# Patient Record
Sex: Male | Born: 1980 | Race: Black or African American | Hispanic: No | Marital: Single | Smoking: Former smoker
Health system: Southern US, Community
[De-identification: ages and names within clinical notes are randomized; demographics above are authoritative.]

## PROBLEM LIST (undated history)

## (undated) DIAGNOSIS — F329 Major depressive disorder, single episode, unspecified: Secondary | ICD-10-CM

## (undated) DIAGNOSIS — G4730 Sleep apnea, unspecified: Secondary | ICD-10-CM

## (undated) DIAGNOSIS — S71132A Puncture wound without foreign body, left thigh, initial encounter: Secondary | ICD-10-CM

## (undated) DIAGNOSIS — R569 Unspecified convulsions: Secondary | ICD-10-CM

## (undated) DIAGNOSIS — K219 Gastro-esophageal reflux disease without esophagitis: Secondary | ICD-10-CM

## (undated) DIAGNOSIS — D869 Sarcoidosis, unspecified: Secondary | ICD-10-CM

## (undated) DIAGNOSIS — F32A Depression, unspecified: Secondary | ICD-10-CM

## (undated) DIAGNOSIS — I739 Peripheral vascular disease, unspecified: Secondary | ICD-10-CM

## (undated) DIAGNOSIS — J189 Pneumonia, unspecified organism: Secondary | ICD-10-CM

## (undated) DIAGNOSIS — J45909 Unspecified asthma, uncomplicated: Secondary | ICD-10-CM

## (undated) DIAGNOSIS — I10 Essential (primary) hypertension: Secondary | ICD-10-CM

## (undated) DIAGNOSIS — Z9289 Personal history of other medical treatment: Secondary | ICD-10-CM

## (undated) HISTORY — PX: CATARACT EXTRACTION W/ INTRAOCULAR LENS  IMPLANT, BILATERAL: SHX1307

## (undated) HISTORY — PX: EYE SURGERY: SHX253

## (undated) HISTORY — PX: TONSILLECTOMY: SUR1361
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
## 2002-04-16 DIAGNOSIS — S71132A Puncture wound without foreign body, left thigh, initial encounter: Secondary | ICD-10-CM

## 2002-04-16 HISTORY — DX: Puncture wound without foreign body, left thigh, initial encounter: S71.132A

## 2008-09-14 ENCOUNTER — Emergency Department (HOSPITAL_BASED_OUTPATIENT_CLINIC_OR_DEPARTMENT_OTHER): Admission: EM | Admit: 2008-09-14 | Discharge: 2008-09-14 | Payer: Self-pay | Admitting: Emergency Medicine

## 2009-01-31 ENCOUNTER — Ambulatory Visit: Payer: Self-pay | Admitting: Thoracic Surgery (Cardiothoracic Vascular Surgery)

## 2010-07-24 LAB — GLUCOSE, CAPILLARY: Glucose-Capillary: 169 mg/dL — ABNORMAL HIGH (ref 70–99)

## 2010-09-07 ENCOUNTER — Emergency Department (HOSPITAL_BASED_OUTPATIENT_CLINIC_OR_DEPARTMENT_OTHER)
Admission: EM | Admit: 2010-09-07 | Discharge: 2010-09-07 | Disposition: A | Discharge status: Home / Self Care | Payer: Medicaid Other | Attending: Emergency Medicine | Admitting: Emergency Medicine

## 2010-09-07 DIAGNOSIS — F17200 Nicotine dependence, unspecified, uncomplicated: Secondary | ICD-10-CM | POA: Insufficient documentation

## 2010-09-07 DIAGNOSIS — E7800 Pure hypercholesterolemia, unspecified: Secondary | ICD-10-CM | POA: Insufficient documentation

## 2010-09-07 DIAGNOSIS — K219 Gastro-esophageal reflux disease without esophagitis: Secondary | ICD-10-CM | POA: Insufficient documentation

## 2010-09-07 DIAGNOSIS — Z79899 Other long term (current) drug therapy: Secondary | ICD-10-CM | POA: Insufficient documentation

## 2010-09-07 DIAGNOSIS — E1169 Type 2 diabetes mellitus with other specified complication: Secondary | ICD-10-CM | POA: Insufficient documentation

## 2010-09-07 LAB — GLUCOSE, CAPILLARY
Glucose-Capillary: >600 mg/dL (ref 70–99)
Glucose-Capillary: 453 mg/dL — ABNORMAL HIGH (ref 70–99)
Glucose-Capillary: 433 mg/dL — ABNORMAL HIGH (ref 70–99)

## 2010-09-07 LAB — URINALYSIS, ROUTINE W REFLEX MICROSCOPIC
Bilirubin Urine: NEGATIVE
Leukocytes, UA: NEGATIVE
Nitrite: NEGATIVE
Specific Gravity, Urine: 1.04 — ABNORMAL HIGH (ref 1.005–1.030)
Urobilinogen, UA: 0.2 mg/dL (ref 0.0–1.0)

## 2010-09-07 LAB — COMPREHENSIVE METABOLIC PANEL
Alkaline Phosphatase: 182 U/L — ABNORMAL HIGH (ref 39–117)
BUN: 15 mg/dL (ref 6–23)
Glucose, Bld: 602 mg/dL (ref 70–99)
Potassium: 4.1 mEq/L (ref 3.5–5.1)
Total Bilirubin: 0.3 mg/dL (ref 0.3–1.2)
Total Protein: 7.4 g/dL (ref 6.0–8.3)

## 2010-09-07 LAB — CBC
MCV: 83.8 fL (ref 78.0–100.0)
Platelets: 230 10*3/uL (ref 150–400)
RDW: 12.7 % (ref 11.5–15.5)
WBC: 5.1 10*3/uL (ref 4.0–10.5)

## 2010-09-07 LAB — DIFFERENTIAL
Basophils Absolute: 0 10*3/uL (ref 0.0–0.1)
Basophils Relative: 0 % (ref 0–1)
Eosinophils Absolute: 0 10*3/uL (ref 0.0–0.7)
Eosinophils Relative: 1 % (ref 0–5)
Neutrophils Relative %: 52 % (ref 43–77)

## 2010-09-07 LAB — URINE MICROSCOPIC-ADD ON

## 2011-04-03 ENCOUNTER — Emergency Department (HOSPITAL_BASED_OUTPATIENT_CLINIC_OR_DEPARTMENT_OTHER)
Admission: EM | Admit: 2011-04-03 | Discharge: 2011-04-03 | Disposition: A | Discharge status: Home / Self Care | Payer: Medicaid Other | Attending: Emergency Medicine | Admitting: Emergency Medicine

## 2011-04-03 ENCOUNTER — Encounter: Payer: Self-pay | Admitting: Emergency Medicine

## 2011-04-03 DIAGNOSIS — H547 Unspecified visual loss: Secondary | ICD-10-CM

## 2011-04-03 DIAGNOSIS — F17200 Nicotine dependence, unspecified, uncomplicated: Secondary | ICD-10-CM | POA: Insufficient documentation

## 2011-04-03 DIAGNOSIS — E119 Type 2 diabetes mellitus without complications: Secondary | ICD-10-CM | POA: Insufficient documentation

## 2011-04-03 DIAGNOSIS — H269 Unspecified cataract: Secondary | ICD-10-CM

## 2011-04-03 NOTE — ED Notes (Signed)
Pt unable to see visual acuity chart.

## 2011-04-03 NOTE — ED Provider Notes (Signed)
History     CSN: [id number] Arrival date & time: 04/03/2011  5:59 PM   First MD Initiated Contact with Patient 04/03/11 1830      Chief Complaint  Patient presents with  . Visual Field Change    (Consider location/radiation/quality/duration/timing/severity/associated sxs/prior treatment) HPI Comments: Patient with history of cataract in left eye.  Was seen by eye doctor one month ago and diagnosed with this.  Is awaiting medicaid approval to have this done.  Now presents with difficulty seeing out of the right eye.  No pain or injury.  Was told there was a cataract in this eye as well and that both would have to be fixed.  The history is provided by the patient.    Past Medical History  Diagnosis Date  . Diabetes mellitus   . Cataract     History reviewed. No pertinent past surgical history.  History reviewed. No pertinent family history.  History  Substance Use Topics  . Smoking status: Current Everyday Smoker -- 0.5 packs/day    Types: Cigarettes  . Smokeless tobacco: Not on file  . Alcohol Use: Yes     occasional      Review of Systems  All other systems reviewed and are negative.    Allergies  Review of patient's allergies indicates no known allergies.  Home Medications   Current Outpatient Rx  Name Route Sig Dispense Refill  . INSULIN ASPART PROT & ASPART (70-30) 100 UNIT/ML [place] SUSP Subcutaneous Inject 40 Units into the skin 2 (two) times daily with a meal.        BP 139/80  Pulse 95  Temp(Src) 98.3 F (36.8 C) (Oral)  Resp 16  SpO2 99%  Physical Exam  Nursing note and vitals reviewed. Constitutional: He is oriented to person, place, and time. He appears well-developed and well-nourished. No distress.  HENT:  Head: Normocephalic and atraumatic.  Eyes: Conjunctivae and EOM are normal. Pupils are equal, round, and reactive to light.       Eye exam reveals bilateral opacities consistent with cataracts.  The fundus is unable to be visualized.   PERRL.  Neck: Normal range of motion. Neck supple.  Neurological: He is alert and oriented to person, place, and time. No cranial nerve deficit. Coordination normal.  Skin: Skin is warm and dry. He is not diaphoretic.    ED Course  Procedures (including critical care time)  Labs Reviewed - No data to display No results found.   No diagnosis found.    MDM  Patient has established care with an opthalmologist.  I recommended that they call tomorrow morning to arrange an appointment with them as soon as possible.          Geoffery Lyons, MD 04/03/11 [phone]

## 2011-04-03 NOTE — ED Notes (Signed)
Pt states he started having blurry vision one month ago to left eye.  Went to eye doctor and was told he had cataract.  Yesterday, the pt states the right eye started with blurry vision.  No head injury.  Blood sugars have been approx 120-130 in am.  No other symptoms.

## 2014-12-05 ENCOUNTER — Emergency Department (HOSPITAL_BASED_OUTPATIENT_CLINIC_OR_DEPARTMENT_OTHER)
Admission: EM | Admit: 2014-12-05 | Discharge: 2014-12-05 | Disposition: A | Discharge status: Home / Self Care | Payer: Medicaid Other | Attending: Emergency Medicine | Admitting: Emergency Medicine

## 2014-12-05 ENCOUNTER — Encounter (HOSPITAL_BASED_OUTPATIENT_CLINIC_OR_DEPARTMENT_OTHER): Payer: Self-pay | Admitting: Emergency Medicine

## 2014-12-05 DIAGNOSIS — E119 Type 2 diabetes mellitus without complications: Secondary | ICD-10-CM | POA: Insufficient documentation

## 2014-12-05 DIAGNOSIS — M546 Pain in thoracic spine: Secondary | ICD-10-CM | POA: Insufficient documentation

## 2014-12-05 DIAGNOSIS — H269 Unspecified cataract: Secondary | ICD-10-CM | POA: Insufficient documentation

## 2014-12-05 DIAGNOSIS — Z794 Long term (current) use of insulin: Secondary | ICD-10-CM | POA: Insufficient documentation

## 2014-12-05 DIAGNOSIS — Z720 Tobacco use: Secondary | ICD-10-CM | POA: Insufficient documentation

## 2014-12-05 MED ORDER — HYDROCODONE-ACETAMINOPHEN 5-325 MG PO TABS: ORAL_TABLET | ORAL | Status: DC

## 2014-12-05 MED ORDER — METHOCARBAMOL 500 MG PO TABS
500.0000 mg | ORAL_TABLET | Freq: Once | ORAL | Status: AC
  Administered 2014-12-05: 500 mg via ORAL
  Filled 2014-12-05: qty 1

## 2014-12-05 MED ORDER — MORPHINE SULFATE (PF) 4 MG/ML IV SOLN
6.0000 mg | Freq: Once | INTRAVENOUS | Status: AC
  Administered 2014-12-05: 6 mg via INTRAMUSCULAR
  Filled 2014-12-05: qty 2

## 2014-12-05 MED ORDER — METHOCARBAMOL 500 MG PO TABS: 1000.0000 mg | ORAL_TABLET | Freq: Four times a day (QID) | ORAL | Status: DC | PRN

## 2014-12-05 NOTE — Discharge Instructions (Signed)
Please take ibuprofen 400mg (this is normally 2 over the counter pills) every 6 hours (take with food to minimze stomach irritation).  ° °Take robaxin and/or Vicodin for breakthrough pain, do not drink alcohol, drive, care for children or perfom other critical tasks while taking robaxin and/or Vicodin . ° °Please follow with your primary care doctor in the next 2 days for a check-up. They must obtain records for further management.  ° °Do not hesitate to return to the Emergency Department for any new, worsening or concerning symptoms.  ° °

## 2014-12-05 NOTE — ED Notes (Signed)
Patient reports back pain which began last night.  Denies injury.  Reports pain to upper and lower right side of back.

## 2014-12-05 NOTE — ED Provider Notes (Signed)
CSN: [id number]     Arrival date & time 12/05/14  1727 History   First MD Initiated Contact with Patient 12/05/14 1805     Chief Complaint  Patient presents with  . Back Pain     (Consider location/radiation/quality/duration/timing/severity/associated sxs/prior Treatment) HPI   Blood pressure 151/107, pulse 98, temperature 99.2 F (37.3 C), temperature source Oral, resp. rate 18, height 6\' 1"  (1.854 m), weight 310 lb (140.615 kg), SpO2 98 %.  Ronnie Shaw is a 34 y.o. male complaining of severe right low thoracic and upper thoracic back pain onset last night. Patient denies any trauma, cough, shortness of breath, fever, chills, history of DVT or PE, calf pain or leg swelling. States pain is exacerbated by movement and palpation, and deep breaths. no pain medication taken prior to arrival.   Past Medical History  Diagnosis Date  . Diabetes mellitus   . Cataract    History reviewed. No pertinent past surgical history. History reviewed. No pertinent family history. Social History  Substance Use Topics  . Smoking status: Current Every Day Smoker -- 0.00 packs/day  . Smokeless tobacco: None  . Alcohol Use: Yes     Comment: occasional    Review of Systems  10 systems reviewed and found to be negative, except as noted in the HPI.   Allergies  Prednisone  Home Medications   Prior to Admission medications   Medication Sig Start Date End Date Taking? Authorizing Provider  HYDROcodone-acetaminophen (NORCO/VICODIN) 5-325 MG per tablet Take 1-2 tablets by mouth every 6 hours as needed for pain and/or cough. 12/05/14   Kenyette Gundy, PA-C  insulin aspart protamine-insulin aspart (NOVOLOG 70/30) (70-30) 100 UNIT/ML injection Inject 40 Units into the skin 2 (two) times daily with a meal.      Historical Provider, MD  methocarbamol (ROBAXIN) 500 MG tablet Take 2 tablets (1,000 mg total) by mouth 4 (four) times daily as needed (Pain). 12/05/14   Kenzi Bardwell, PA-C   BP 151/107  mmHg  Pulse 98  Temp(Src) 99.2 F (37.3 C) (Oral)  Resp 18  Ht 6\' 1"  (1.854 m)  Wt 310 lb (140.615 kg)  BMI 40.91 kg/m2  SpO2 98% Physical Exam  Constitutional: He is oriented to person, place, and time. He appears well-developed and well-nourished. No distress.  HENT:  Head: Normocephalic.  Mouth/Throat: Oropharynx is clear and moist.  Eyes: Conjunctivae and EOM are normal. Pupils are equal, round, and reactive to light.  Cardiovascular: Normal rate, regular rhythm and intact distal pulses.   Pulmonary/Chest: Effort normal and breath sounds normal. No stridor. No respiratory distress. He has no wheezes. He has no rales. He exhibits tenderness.  Abdominal: Soft.  Musculoskeletal: Normal range of motion.       Arms: Neurological: He is alert and oriented to person, place, and time.  Psychiatric: He has a normal mood and affect.  Nursing note and vitals reviewed.   ED Course  Procedures (including critical care time) Labs Review Labs Reviewed - No data to display  Imaging Review No results found. I have personally reviewed and evaluated these images and lab results as part of my medical decision-making.   EKG Interpretation None      MDM   Final diagnoses:  Acute thoracic back pain    Filed Vitals:   12/05/14 1733  BP: 151/107  Pulse: 98  Temp: 99.2 F (37.3 C)  TempSrc: Oral  Resp: 18  Height: 6\' 1"  (1.854 m)  Weight: 310 lb (140.615 kg)  SpO2: 98%  Medications  morphine 4 MG/ML injection 6 mg (6 mg Intramuscular Given 12/05/14 1835)  methocarbamol (ROBAXIN) tablet 500 mg (500 mg Oral Given 12/05/14 1835)    Ronnie Shaw is a pleasant 34 y.o. male presenting with right thoracic upper and lower back pain. These do not communicate he is well able to localize the area of his discomfort and they are exquisitely tender to palpation. There was no trauma however, I think this is musculoskeletal in nature, he is very tender to light palpation and movement of  the right arm also exacerbates the pain. Patient is not tachypnea or tachycardic. He saturating well on room air and his lung sounds are clear to auscultation bilaterally.   Evaluation does not show pathology that would require ongoing emergent intervention or inpatient treatment. Pt is hemodynamically stable and mentating appropriately. Discussed findings and plan with patient/guardian, who agrees with care plan. All questions answered. Return precautions discussed and outpatient follow up given.   New Prescriptions   HYDROCODONE-ACETAMINOPHEN (NORCO/VICODIN) 5-325 MG PER TABLET    Take 1-2 tablets by mouth every 6 hours as needed for pain and/or cough.   METHOCARBAMOL (ROBAXIN) 500 MG TABLET    Take 2 tablets (1,000 mg total) by mouth 4 (four) times daily as needed (Pain).         Wynetta Emery, PA-C 12/05/14 1914  Geoffery Lyons, MD 12/06/14 2259

## 2014-12-18 ENCOUNTER — Emergency Department (HOSPITAL_COMMUNITY): Payer: Self-pay

## 2014-12-18 ENCOUNTER — Encounter (HOSPITAL_COMMUNITY): Payer: Self-pay | Admitting: Emergency Medicine

## 2014-12-18 ENCOUNTER — Emergency Department (HOSPITAL_COMMUNITY): Payer: Medicaid Other

## 2014-12-18 ENCOUNTER — Emergency Department (HOSPITAL_COMMUNITY)
Admission: EM | Admit: 2014-12-18 | Discharge: 2014-12-18 | Disposition: A | Discharge status: Home / Self Care | Payer: Self-pay | Attending: Emergency Medicine | Admitting: Emergency Medicine

## 2014-12-18 DIAGNOSIS — R61 Generalized hyperhidrosis: Secondary | ICD-10-CM | POA: Insufficient documentation

## 2014-12-18 DIAGNOSIS — E119 Type 2 diabetes mellitus without complications: Secondary | ICD-10-CM | POA: Insufficient documentation

## 2014-12-18 DIAGNOSIS — J189 Pneumonia, unspecified organism: Secondary | ICD-10-CM

## 2014-12-18 DIAGNOSIS — Z720 Tobacco use: Secondary | ICD-10-CM | POA: Insufficient documentation

## 2014-12-18 DIAGNOSIS — H269 Unspecified cataract: Secondary | ICD-10-CM | POA: Insufficient documentation

## 2014-12-18 DIAGNOSIS — Z794 Long term (current) use of insulin: Secondary | ICD-10-CM | POA: Insufficient documentation

## 2014-12-18 DIAGNOSIS — R109 Unspecified abdominal pain: Secondary | ICD-10-CM | POA: Insufficient documentation

## 2014-12-18 DIAGNOSIS — E663 Overweight: Secondary | ICD-10-CM | POA: Insufficient documentation

## 2014-12-18 DIAGNOSIS — J159 Unspecified bacterial pneumonia: Secondary | ICD-10-CM | POA: Insufficient documentation

## 2014-12-18 LAB — CBC WITH DIFFERENTIAL/PLATELET
BASOS PCT: 0 % (ref 0–1)
Basophils Absolute: 0 10*3/uL (ref 0.0–0.1)
Eosinophils Absolute: 0.1 10*3/uL (ref 0.0–0.7)
Eosinophils Relative: 1 % (ref 0–5)
HEMATOCRIT: 38.6 % — AB (ref 39.0–52.0)
HEMOGLOBIN: 12.7 g/dL — AB (ref 13.0–17.0)
LYMPHS ABS: 2.3 10*3/uL (ref 0.7–4.0)
LYMPHS PCT: 25 % (ref 12–46)
MCH: 28.4 pg (ref 26.0–34.0)
MCHC: 32.9 g/dL (ref 30.0–36.0)
MCV: 86.4 fL (ref 78.0–100.0)
MONO ABS: 0.7 10*3/uL (ref 0.1–1.0)
MONOS PCT: 8 % (ref 3–12)
NEUTROS ABS: 6.2 10*3/uL (ref 1.7–7.7)
NEUTROS PCT: 66 % (ref 43–77)
Platelets: 367 10*3/uL (ref 150–400)
RBC: 4.47 MIL/uL (ref 4.22–5.81)
RDW: 12.9 % (ref 11.5–15.5)
WBC: 9.3 10*3/uL (ref 4.0–10.5)

## 2014-12-18 LAB — COMPREHENSIVE METABOLIC PANEL
ALBUMIN: 3.8 g/dL (ref 3.5–5.0)
ALT: 22 U/L (ref 17–63)
ANION GAP: 9 (ref 5–15)
AST: 16 U/L (ref 15–41)
Alkaline Phosphatase: 103 U/L (ref 38–126)
BUN: 11 mg/dL (ref 6–20)
CHLORIDE: 93 mmol/L — AB (ref 101–111)
CO2: 25 mmol/L (ref 22–32)
Calcium: 9.2 mg/dL (ref 8.9–10.3)
Creatinine, Ser: 0.7 mg/dL (ref 0.61–1.24)
GFR calc Af Amer: >60 mL/min (ref >60)
Glucose, Bld: 249 mg/dL — ABNORMAL HIGH (ref 65–99)
POTASSIUM: 4.2 mmol/L (ref 3.5–5.1)
Sodium: 127 mmol/L — ABNORMAL LOW (ref 135–145)
Total Bilirubin: 0.4 mg/dL (ref 0.3–1.2)
Total Protein: 8.2 g/dL — ABNORMAL HIGH (ref 6.5–8.1)

## 2014-12-18 LAB — URINALYSIS, ROUTINE W REFLEX MICROSCOPIC
Bilirubin Urine: NEGATIVE
GLUCOSE, UA: 500 mg/dL — AB
Hgb urine dipstick: NEGATIVE
KETONES UR: NEGATIVE mg/dL
NITRITE: NEGATIVE
PROTEIN: NEGATIVE mg/dL
Specific Gravity, Urine: 1.031 — ABNORMAL HIGH (ref 1.005–1.030)
Urobilinogen, UA: 1 mg/dL (ref 0.0–1.0)
pH: 6.5 (ref 5.0–8.0)

## 2014-12-18 LAB — URINE MICROSCOPIC-ADD ON

## 2014-12-18 LAB — I-STAT TROPONIN, ED: TROPONIN I, POC: 0 ng/mL (ref 0.00–0.08)

## 2014-12-18 LAB — LIPASE, BLOOD: Lipase: <10 U/L — ABNORMAL LOW (ref 22–51)

## 2014-12-18 LAB — CBG MONITORING, ED: Glucose-Capillary: 217 mg/dL — ABNORMAL HIGH (ref 65–99)

## 2014-12-18 MED ORDER — KETOROLAC TROMETHAMINE 30 MG/ML IJ SOLN
30.0000 mg | Freq: Once | INTRAMUSCULAR | Status: AC
  Administered 2014-12-18: 30 mg via INTRAVENOUS
  Filled 2014-12-18: qty 1

## 2014-12-18 MED ORDER — AZITHROMYCIN 250 MG PO TABS: 250.0000 mg | ORAL_TABLET | Freq: Every day | ORAL | Status: AC

## 2014-12-18 MED ORDER — SODIUM CHLORIDE 0.9 % IV SOLN
1000.0000 mL | INTRAVENOUS | Status: DC
  Administered 2014-12-18: 1000 mL via INTRAVENOUS

## 2014-12-18 MED ORDER — AMOXICILLIN 500 MG PO CAPS: 500.0000 mg | ORAL_CAPSULE | Freq: Three times a day (TID) | ORAL | Status: DC

## 2014-12-18 MED ORDER — NAPROXEN 500 MG PO TABS: 500.0000 mg | ORAL_TABLET | Freq: Two times a day (BID) | ORAL | Status: DC

## 2014-12-18 MED ORDER — HYDROMORPHONE HCL 1 MG/ML IJ SOLN
1.0000 mg | INTRAMUSCULAR | Status: DC | PRN
  Administered 2014-12-18 (×2): 1 mg via INTRAVENOUS
  Filled 2014-12-18 (×2): qty 1

## 2014-12-18 MED ORDER — DEXTROSE 5 % IV SOLN
1.0000 g | Freq: Once | INTRAVENOUS | Status: AC
  Administered 2014-12-18: 1 g via INTRAVENOUS
  Filled 2014-12-18: qty 10

## 2014-12-18 MED ORDER — AZITHROMYCIN 250 MG PO TABS
500.0000 mg | ORAL_TABLET | Freq: Once | ORAL | Status: AC
  Administered 2014-12-18: 500 mg via ORAL
  Filled 2014-12-18: qty 2

## 2014-12-18 MED ORDER — OXYCODONE-ACETAMINOPHEN 5-325 MG PO TABS: 1.0000 | ORAL_TABLET | Freq: Four times a day (QID) | ORAL | Status: DC | PRN

## 2014-12-18 MED ORDER — IOHEXOL 350 MG/ML SOLN
100.0000 mL | Freq: Once | INTRAVENOUS | Status: AC | PRN
  Administered 2014-12-18: 100 mL via INTRAVENOUS

## 2014-12-18 NOTE — ED Notes (Signed)
Pt arrived to the ED with a complaint of right sided flank pain.  Pt states pain has been present for more than a week.  Pt seen previously for it but released.  Pt has unproductive cough for three days.

## 2014-12-18 NOTE — ED Notes (Signed)
Ambulated without difficulty.

## 2014-12-18 NOTE — Discharge Instructions (Signed)

## 2014-12-18 NOTE — ED Notes (Signed)
MD Knapp at bedside 

## 2014-12-18 NOTE — ED Provider Notes (Signed)
CSN: [id number]   Arrival date & time 12/18/14 0327  History  This chart was scribed for Linwood Dibbles, MD by Bethel Born, ED Scribe. This patient was seen in room WA15/WA15 and the patient's care was started at 3:44 AM.  Chief Complaint  Patient presents with  . Flank Pain    HPI The history is provided by the patient. No language interpreter was used.   Ronnie Shaw is a 34 y.o. male who presents to the Emergency Department complaining of constant right flank pain with gradual onset 2 weeks ago. Pt describes the pain as sharp and rates it 10/10 in severity. The pain is worse with deep breathing and laying flat.  Also complains of sweating and worsening cough. Pt denies fever, nausea, vomiting, LE swelling, dysuria, and hematuria.   Past Medical History  Diagnosis Date  . Diabetes mellitus   . Cataract     History reviewed. No pertinent past surgical history.  History reviewed. No pertinent family history.  Social History  Substance Use Topics  . Smoking status: Current Every Day Smoker -- 0.00 packs/day  . Smokeless tobacco: None  . Alcohol Use: Yes     Comment: occasional     Review of Systems  Constitutional: Positive for diaphoresis.  Respiratory: Positive for cough.   Genitourinary: Positive for flank pain. Negative for dysuria and hematuria.  10 Systems reviewed and all are negative for acute change except as noted in the HPI.    Home Medications   Prior to Admission medications   Medication Sig Start Date End Date Taking? Authorizing Provider  insulin aspart protamine-insulin aspart (NOVOLOG 70/30) (70-30) 100 UNIT/ML injection Inject 40 Units into the skin 2 (two) times daily with a meal.     Yes Historical Provider, MD  methocarbamol (ROBAXIN) 500 MG tablet Take 2 tablets (1,000 mg total) by mouth 4 (four) times daily as needed (Pain). 12/05/14  Yes Nicole Pisciotta, PA-C  naproxen sodium (ANAPROX) 220 MG tablet Take 440 mg by mouth 2 (two) times daily as needed  (pain).   Yes Historical Provider, MD  amoxicillin (AMOXIL) 500 MG capsule Take 1 capsule (500 mg total) by mouth 3 (three) times daily. 12/18/14   Linwood Dibbles, MD  azithromycin (ZITHROMAX) 250 MG tablet Take 1 tablet (250 mg total) by mouth daily. Take 1 tab po qd starting 12/19/14 12/19/14 12/22/14  Linwood Dibbles, MD  naproxen (NAPROSYN) 500 MG tablet Take 1 tablet (500 mg total) by mouth 2 (two) times daily. 12/18/14   Linwood Dibbles, MD  oxyCODONE-acetaminophen (PERCOCET/ROXICET) 5-325 MG per tablet Take 1-2 tablets by mouth every 6 (six) hours as needed. 12/18/14   Linwood Dibbles, MD    Allergies  Prednisone  Triage Vitals: BP 120/67 mmHg  Pulse 97  Temp(Src) 98.1 F (36.7 C) (Oral)  Resp 18  Ht  (1.854 m)  Wt 310 lb (140.615 kg)  BMI 40.91 kg/m2  SpO2 97%  Physical Exam  Constitutional: He appears well-developed and well-nourished. No distress.  Overweight  HENT:  Head: Normocephalic and atraumatic.  Right Ear: External ear normal.  Left Ear: External ear normal.  Eyes: Conjunctivae are normal. Right eye exhibits no discharge. Left eye exhibits no discharge. No scleral icterus.  Neck: Neck supple. No tracheal deviation present.  Cardiovascular: Normal rate, regular rhythm and intact distal pulses.   Pulmonary/Chest: Effort normal and breath sounds normal. No stridor. No respiratory distress. He has no wheezes. He has no rales.  Abdominal: Soft. Bowel sounds are normal. He  exhibits no distension. There is tenderness. There is CVA tenderness (right). There is no rebound and no guarding.  Musculoskeletal: He exhibits no edema or tenderness.  Neurological: He is alert. He has normal strength. No cranial nerve deficit (no facial droop, extraocular movements intact, no slurred speech) or sensory deficit. He exhibits normal muscle tone. He displays no seizure activity. Coordination normal.  Skin: Skin is warm and dry. No rash noted.  Psychiatric: He has a normal mood and affect.  Nursing note and vitals  reviewed.   ED Course  Procedures   DIAGNOSTIC STUDIES: Oxygen Saturation is 97% on RA, normal by my interpretation.    COORDINATION OF CARE: 3:47 AM Discussed treatment plan which includes lab work, CXR, Dilaudid, and IVF with pt at bedside and pt agreed to plan.  Labs Reviewed  CBC WITH DIFFERENTIAL/PLATELET - Abnormal; Notable for the following:    Hemoglobin 12.7 (*)    HCT 38.6 (*)    All other components within normal limits  LIPASE, BLOOD - Abnormal; Notable for the following:    Lipase <10 (*)    All other components within normal limits  COMPREHENSIVE METABOLIC PANEL - Abnormal; Notable for the following:    Sodium 127 (*)    Chloride 93 (*)    Glucose, Bld 249 (*)    Total Protein 8.2 (*)    All other components within normal limits  URINALYSIS, ROUTINE W REFLEX MICROSCOPIC (NOT AT [facility]) - Abnormal; Notable for the following:    Specific Gravity, Urine 1.031 (*)    Glucose, UA 500 (*)    Leukocytes, UA TRACE (*)    All other components within normal limits  URINE MICROSCOPIC-ADD ON - Abnormal; Notable for the following:    Squamous Epithelial / LPF FEW (*)    All other components within normal limits    I, Linwood Dibbles, MD, personally reviewed and evaluated these images and lab results as part of my medical decision-making.  Imaging Review Dg Chest 2 View  12/18/2014   CLINICAL DATA:  Right chest and flank pain for 2 weeks. Nonproductive cough. Dyspnea for 3 days.  EXAM: CHEST  2 VIEW  COMPARISON:  None.  FINDINGS: Possible patchy alveolar opacity in the posterior right base. The left lung is clear. No significant effusion. Pulmonary vasculature is normal. Heart size is normal.  IMPRESSION: Suspicious for a small or early posterior right lower lobe infiltrate.   Electronically Signed   By: Ellery Plunk M.D.   On: 12/18/2014 04:07   Ct Angio Chest Pe W/cm &/or Wo Cm  12/18/2014   CLINICAL DATA:  Right flank pain, onset 2 weeks ago. The pain is pleuritic and  positional. Diaphoresis. Worsening cough.  EXAM: CT ANGIOGRAPHY CHEST WITH CONTRAST  TECHNIQUE: Multidetector CT imaging of the chest was performed using the standard protocol during bolus administration of intravenous contrast. Multiplanar CT image reconstructions and MIPs were obtained to evaluate the vascular anatomy.  CONTRAST:  OMNIPAQUE IOHEXOL 350 MG/ML SOLN  COMPARISON:  12/18/2014 radiographs  FINDINGS: Cardiovascular: There is adequate opacification of the pulmonary arteries to exclude large or central pulmonary emboli. A small subsegmental embolus may be less detectable due to study limitations related to body habitus, but overall the quality of the pulmonary arterial opacification is good. There is no evidence of pulmonary embolism. The thoracic aorta is normal in caliber and intact.  Lungs: There is confluent consolidation in the right lower lobe posterior base and this may represent infectious infiltrate. This corresponds  to the abnormality observed on radiography.  Central airways: Patent  Effusions: There is a small right pleural effusion. The left lung is clear. There is no left effusion. There is no pericardial effusion.  Lymphadenopathy: There is mildly prominent adenopathy in the hilar regions, right greater than left. This may be reactive. A few nonspecific mediastinal nodes are also present, right greater than left.  Esophagus: Unremarkable  Upper abdomen: Unremarkable  Musculoskeletal: No significant abnormality  Review of the MIP images confirms the above findings.  IMPRESSION: 1. Negative for pulmonary embolism 2. Consolidation in the posterior right lower lobe, corresponding to the radiographic abnormality. This is suspicious for infectious infiltrate. There also is a small right pleural effusion. 3. Mildly prominent right hilar adenopathy, nonspecific but likely reactive.   Electronically Signed   By: Ellery Plunk M.D.   On: 12/18/2014 06:47      EKG  Rate: 95  Rhythm:  normal sinus rhythm  QRS Axis: normal  Intervals: normal  ST/T Wave abnormalities: normal  Conduction Disutrbances:none  Narrative Interpretation: early repol changes  Old EKG Reviewed: none available   Medications  HYDROmorphone (DILAUDID) injection 1 mg (1 mg Intravenous Given 12/18/14 0648)  0.9 %  sodium chloride infusion (0 mLs Intravenous Stopped 12/18/14 0446)  ketorolac (TORADOL) 30 MG/ML injection 30 mg (not administered)  cefTRIAXone (ROCEPHIN) 1 g in dextrose 5 % 50 mL IVPB (not administered)  azithromycin (ZITHROMAX) tablet 500 mg (not administered)  iohexol (OMNIPAQUE) 350 MG/ML injection 100 mL (100 mLs Intravenous Contrast Given 12/18/14 0603)      MDM   Final diagnoses:  CAP (community acquired pneumonia)   Patient CT scan shows a probable pneumonia. No evidence of pulmonary embolism.  Laboratory tests shows a chronic hyponatremia and elevation and his blood sugar.  Improved from previous labs. Will dc home with abx to cover CAP, add on amoxicillin for better s pneumo coverage.  Patient improved with treatment in the emergency department. He is comfortable with outpatient treatment. Discharged with a prescription for azithromycin and amoxicillin for 4 strep pneumonia coverage.  I personally performed the services described in this documentation, which was scribed in my presence.  The recorded information has been reviewed and is accurate.    Linwood Dibbles, MD 12/18/14 [phone]

## 2015-08-17 ENCOUNTER — Encounter (HOSPITAL_BASED_OUTPATIENT_CLINIC_OR_DEPARTMENT_OTHER): Payer: Self-pay

## 2015-08-17 ENCOUNTER — Emergency Department (HOSPITAL_BASED_OUTPATIENT_CLINIC_OR_DEPARTMENT_OTHER)
Admission: EM | Admit: 2015-08-17 | Discharge: 2015-08-17 | Disposition: A | Discharge status: Home / Self Care | Payer: Self-pay | Attending: Emergency Medicine | Admitting: Emergency Medicine

## 2015-08-17 ENCOUNTER — Emergency Department (HOSPITAL_BASED_OUTPATIENT_CLINIC_OR_DEPARTMENT_OTHER): Payer: Self-pay

## 2015-08-17 DIAGNOSIS — R079 Chest pain, unspecified: Secondary | ICD-10-CM

## 2015-08-17 DIAGNOSIS — L02213 Cutaneous abscess of chest wall: Secondary | ICD-10-CM | POA: Insufficient documentation

## 2015-08-17 DIAGNOSIS — Z87891 Personal history of nicotine dependence: Secondary | ICD-10-CM | POA: Insufficient documentation

## 2015-08-17 DIAGNOSIS — K219 Gastro-esophageal reflux disease without esophagitis: Secondary | ICD-10-CM | POA: Insufficient documentation

## 2015-08-17 DIAGNOSIS — E119 Type 2 diabetes mellitus without complications: Secondary | ICD-10-CM | POA: Insufficient documentation

## 2015-08-17 DIAGNOSIS — Z794 Long term (current) use of insulin: Secondary | ICD-10-CM | POA: Insufficient documentation

## 2015-08-17 DIAGNOSIS — L0291 Cutaneous abscess, unspecified: Secondary | ICD-10-CM

## 2015-08-17 HISTORY — DX: Sarcoidosis, unspecified: D86.9

## 2015-08-17 LAB — CBG MONITORING, ED: GLUCOSE-CAPILLARY: 295 mg/dL — AB (ref 65–99)

## 2015-08-17 LAB — CBC WITH DIFFERENTIAL/PLATELET
Basophils Absolute: 0 10*3/uL (ref 0.0–0.1)
Basophils Relative: 0 %
EOS PCT: 1 %
Eosinophils Absolute: 0 10*3/uL (ref 0.0–0.7)
HCT: 37.5 % — ABNORMAL LOW (ref 39.0–52.0)
Hemoglobin: 12.6 g/dL — ABNORMAL LOW (ref 13.0–17.0)
LYMPHS ABS: 1.7 10*3/uL (ref 0.7–4.0)
LYMPHS PCT: 31 %
MCH: 29.3 pg (ref 26.0–34.0)
MCHC: 33.6 g/dL (ref 30.0–36.0)
MCV: 87.2 fL (ref 78.0–100.0)
MONOS PCT: 7 %
Monocytes Absolute: 0.4 10*3/uL (ref 0.1–1.0)
Neutro Abs: 3.3 10*3/uL (ref 1.7–7.7)
Neutrophils Relative %: 61 %
PLATELETS: 236 10*3/uL (ref 150–400)
RBC: 4.3 MIL/uL (ref 4.22–5.81)
RDW: 12.6 % (ref 11.5–15.5)
WBC: 5.5 10*3/uL (ref 4.0–10.5)

## 2015-08-17 LAB — BASIC METABOLIC PANEL
Anion gap: 6 (ref 5–15)
BUN: 10 mg/dL (ref 6–20)
CALCIUM: 8.7 mg/dL — AB (ref 8.9–10.3)
CHLORIDE: 103 mmol/L (ref 101–111)
CO2: 27 mmol/L (ref 22–32)
CREATININE: 0.69 mg/dL (ref 0.61–1.24)
GFR calc non Af Amer: >60 mL/min (ref >60)
GLUCOSE: 328 mg/dL — AB (ref 65–99)
Potassium: 4.1 mmol/L (ref 3.5–5.1)
Sodium: 136 mmol/L (ref 135–145)

## 2015-08-17 LAB — TROPONIN I: Troponin I: <0.03 ng/mL (ref <0.031)

## 2015-08-17 MED ORDER — INSULIN ASPART PROT & ASPART (70-30 MIX) 100 UNIT/ML ~~LOC~~ SUSP: 50.0000 [IU] | Freq: Two times a day (BID) | SUBCUTANEOUS | Status: DC

## 2015-08-17 MED ORDER — GI COCKTAIL ~~LOC~~
30.0000 mL | Freq: Once | ORAL | Status: AC
Start: 2015-08-17 — End: 2015-08-17
  Administered 2015-08-17: 30 mL via ORAL
  Filled 2015-08-17: qty 30

## 2015-08-17 MED ORDER — OMEPRAZOLE 20 MG PO CPDR: 20.0000 mg | DELAYED_RELEASE_CAPSULE | Freq: Every day | ORAL | Status: DC

## 2015-08-17 MED ORDER — LIDOCAINE-EPINEPHRINE (PF) 2 %-1:200000 IJ SOLN
10.0000 mL | Freq: Once | INTRAMUSCULAR | Status: AC
  Administered 2015-08-17: 10 mL
  Filled 2015-08-17: qty 20

## 2015-08-17 MED ORDER — TRUE METRIX METER W/DEVICE KIT: 1.0000 | PACK | Freq: Once | Status: DC

## 2015-08-17 MED ORDER — SULFAMETHOXAZOLE-TRIMETHOPRIM 800-160 MG PO TABS: 1.0000 | ORAL_TABLET | Freq: Two times a day (BID) | ORAL | Status: AC

## 2015-08-17 MED FILL — TRUE METRIX GLUCOSE TEST ST: 30 days supply | Qty: 100 | Fill #0

## 2015-08-17 MED FILL — OMEPRAZOLE DR 20 MG CAPSULE: 20 | 30 days supply | Qty: 30 | Fill #0

## 2015-08-17 MED FILL — TRUEplus LANCETS 30G MISC: 30 days supply | Qty: 100 | Fill #0

## 2015-08-17 MED FILL — SULFAMETHOXAZOLE-TMP DS TAB: 800-160 | 7 days supply | Qty: 14 | Fill #0

## 2015-08-17 MED FILL — *NOVOLOG MIX 70/30 10ML VL: (70-30) 100 | 20 days supply | Qty: 20 | Fill #0

## 2015-08-17 NOTE — Discharge Instructions (Signed)
Nonspecific Chest Pain  °Chest pain can be caused by many different conditions. There is always a chance that your pain could be related to something serious, such as a heart attack or a blood clot in your lungs. Chest pain can also be caused by conditions that are not life-threatening. If you have chest pain, it is very important to follow up with your health care provider. °CAUSES  °Chest pain can be caused by: °· Heartburn. °· Pneumonia or bronchitis. °· Anxiety or stress. °· Inflammation around your heart (pericarditis) or lung (pleuritis or pleurisy). °· A blood clot in your lung. °· A collapsed lung (pneumothorax). It can develop suddenly on its own (spontaneous pneumothorax) or from trauma to the chest. °· Shingles infection (varicella-zoster virus). °· Heart attack. °· Damage to the bones, muscles, and cartilage that make up your chest wall. This can include: °¨ Bruised bones due to injury. °¨ Strained muscles or cartilage due to frequent or repeated coughing or overwork. °¨ Fracture to one or more ribs. °¨ Sore cartilage due to inflammation (costochondritis). °RISK FACTORS  °Risk factors for chest pain may include: °· Activities that increase your risk for trauma or injury to your chest. °· Respiratory infections or conditions that cause frequent coughing. °· Medical conditions or overeating that can cause heartburn. °· Heart disease or family history of heart disease. °· Conditions or health behaviors that increase your risk of developing a blood clot. °· Having had chicken pox (varicella zoster). °SIGNS AND SYMPTOMS °Chest pain can feel like: °· Burning or tingling on the surface of your chest or deep in your chest. °· Crushing, pressure, aching, or squeezing pain. °· Dull or sharp pain that is worse when you move, cough, or take a deep breath. °· Pain that is also felt in your back, neck, shoulder, or arm, or pain that spreads to any of these areas. °Your chest pain may come and go, or it may stay  constant. °DIAGNOSIS °Lab tests or other studies may be needed to find the cause of your pain. Your health care provider may have you take a test called an ambulatory ECG (electrocardiogram). An ECG records your heartbeat patterns at the time the test is performed. You may also have other tests, such as: °· Transthoracic echocardiogram (TTE). During echocardiography, sound waves are used to create a picture of all of the heart structures and to look at how blood flows through your heart. °· Transesophageal echocardiogram (TEE). This is a more advanced imaging test that obtains images from inside your body. It allows your health care provider to see your heart in finer detail. °· Cardiac monitoring. This allows your health care provider to monitor your heart rate and rhythm in real time. °· Holter monitor. This is a portable device that records your heartbeat and can help to diagnose abnormal heartbeats. It allows your health care provider to track your heart activity for several days, if needed. °· Stress tests. These can be done through exercise or by taking medicine that makes your heart beat more quickly. °· Blood tests. °· Imaging tests. °TREATMENT  °Your treatment depends on what is causing your chest pain. Treatment may include: °· Medicines. These may include: °¨ Acid blockers for heartburn. °¨ Anti-inflammatory medicine. °¨ Pain medicine for inflammatory conditions. °¨ Antibiotic medicine, if an infection is present. °¨ Medicines to dissolve blood clots. °¨ Medicines to treat coronary artery disease. °· Supportive care for conditions that do not require medicines. This may include: °¨ Resting. °¨ Applying heat   heartburn.    Anti-inflammatory medicine.    Pain medicine for inflammatory conditions.    Antibiotic medicine, if an infection is present.    Medicines to dissolve blood clots.    Medicines to treat coronary artery disease.   Supportive care for conditions that do not require medicines. This may include:    Resting.    Applying heat or cold packs to injured areas.    Limiting activities until pain decreases.  HOME CARE INSTRUCTIONS   If you were prescribed an antibiotic medicine, finish it all even if you start to feel better.   Avoid any activities that bring on chest pain.   Do not use any tobacco products, including  cigarettes, chewing tobacco, or electronic cigarettes. If you need help quitting, ask your health care provider.   Do not drink alcohol.   Take medicines only as directed by your health care provider.   Keep all follow-up visits as directed by your health care provider. This is important. This includes any further testing if your chest pain does not go away.   If heartburn is the cause for your chest pain, you may be told to keep your head raised (elevated) while sleeping. This reduces the chance that acid will go from your stomach into your esophagus.   Make lifestyle changes as directed by your health care provider. These may include:    Getting regular exercise. Ask your health care provider to suggest some activities that are safe for you.    Eating a heart-healthy diet. A registered dietitian can help you to learn healthy eating options.    Maintaining a healthy weight.    Managing diabetes, if necessary.    Reducing stress.  SEEK MEDICAL CARE IF:   Your chest pain does not go away after treatment.   You have a rash with blisters on your chest.   You have a fever.  SEEK IMMEDIATE MEDICAL CARE IF:    Your chest pain is worse.   You have an increasing cough, or you cough up blood.   You have severe abdominal pain.   You have severe weakness.   You faint.   You have chills.   You have sudden, unexplained chest discomfort.   You have sudden, unexplained discomfort in your arms, back, neck, or jaw.   You have shortness of breath at any time.   You suddenly start to sweat, or your skin gets clammy.   You feel nauseous or you vomit.   You suddenly feel light-headed or dizzy.   Your heart begins to beat quickly, or it feels like it is skipping beats.  These symptoms may represent a serious problem that is an emergency. Do not wait to see if the symptoms will go away. Get medical help right away. Call your local emergency services (911 in the [place] Do not drive yourself to the hospital.     This  information is not intended to replace advice given to you by your health care provider. Make sure you discuss any questions you have with your health care provider.     Document Released: 01/10/2005 Document Revised: 04/23/2014 Document Reviewed: 11/06/2013  Elsevier Interactive Patient Education 2016 [employer]  Food Choices for Gastroesophageal Reflux Disease, Adult  When you have gastroesophageal reflux disease (GERD), the foods you eat and your eating habits are very important. Choosing the right foods can help ease the discomfort of GERD.  WHAT GENERAL GUIDELINES DO I NEED TO FOLLOW?   Choose fruits,   following are some foods and drinks that may worsen your symptoms: Vegetables Tomatoes. Tomato juice. Tomato and spaghetti sauce. Chili peppers. Onion and garlic. Horseradish. Fruits Oranges, grapefruit, and lemon (fruit and juice). Meats High-fat meats, fish, and poultry. This includes hot dogs, ribs, ham, sausage, salami, and bacon. Dairy Whole milk and chocolate milk. Sour cream. Cream. Butter. Ice cream. Cream cheese.  Beverages Coffee and tea, with or without caffeine. Carbonated beverages or energy drinks. Condiments Hot sauce. Barbecue sauce.  Sweets/Desserts Chocolate and cocoa. Donuts. Peppermint and spearmint. Fats and  Oils High-fat foods, including [place] fries and potato chips. Other Vinegar. Strong spices, such as black pepper, white pepper, red pepper, cayenne, curry powder, cloves, ginger, and chili powder. The items listed above may not be a complete list of foods and beverages to avoid. Contact your dietitian for more information.   This information is not intended to replace advice given to you by your health care provider. Make sure you discuss any questions you have with your health care provider.   Document Released: 04/02/2005 Document Revised: 04/23/2014 Document Reviewed: 02/04/2013 Elsevier Interactive Patient Education 2016 [employer]  Abscess An abscess is an infected area that contains a collection of pus and debris.It can occur in almost any part of the body. An abscess is also known as a furuncle or boil. CAUSES  An abscess occurs when tissue gets infected. This can occur from blockage of oil or sweat glands, infection of hair follicles, or a minor injury to the skin. As the body tries to fight the infection, pus collects in the area and creates pressure under the skin. This pressure causes pain. People with weakened immune systems have difficulty fighting infections and get certain abscesses more often.  SYMPTOMS Usually an abscess develops on the skin and becomes a painful mass that is red, warm, and tender. If the abscess forms under the skin, you may feel a moveable soft area under the skin. Some abscesses break open (rupture) on their own, but most will continue to get worse without care. The infection can spread deeper into the body and eventually into the bloodstream, causing you to feel ill.  DIAGNOSIS  Your caregiver will take your medical history and perform a physical exam. A sample of fluid may also be taken from the abscess to determine what is causing your infection. TREATMENT  Your caregiver may prescribe antibiotic medicines to fight the infection. However, taking  antibiotics alone usually does not cure an abscess. Your caregiver may need to make a small cut (incision) in the abscess to drain the pus. In some cases, gauze is packed into the abscess to reduce pain and to continue draining the area. HOME CARE INSTRUCTIONS   Only take over-the-counter or prescription medicines for pain, discomfort, or fever as directed by your caregiver.  If you were prescribed antibiotics, take them as directed. Finish them even if you start to feel better.  If gauze is used, follow your caregiver's directions for changing the gauze.  To avoid spreading the infection:  Keep your draining abscess covered with a bandage.  Wash your hands well.  Do not share personal care items, towels, or whirlpools with others.  Avoid skin contact with others.  Keep your skin and clothes clean around the abscess.  Keep all follow-up appointments as directed by your caregiver. SEEK MEDICAL CARE IF:   You have increased pain, swelling, redness, fluid drainage, or bleeding.  You have muscle aches, chills, or a general ill feeling.  You  have a fever. MAKE SURE YOU:   Understand these instructions.  Will watch your condition.  Will get help right away if you are not doing well or get worse.   This information is not intended to replace advice given to you by your health care provider. Make sure you discuss any questions you have with your health care provider.   Document Released: 01/10/2005 Document Revised: 10/02/2011 Document Reviewed: 06/15/2011 Elsevier Interactive Patient Education [employer]

## 2015-08-17 NOTE — ED Provider Notes (Signed)
CSN: [id number]     Arrival date & time 08/17/15  1259 History   First MD Initiated Contact with Patient 08/17/15 1324     Chief Complaint  Patient presents with  . Chest Pain    Ronnie Shaw is a 35 y.o. male Who presents to the emergency department complaining of substernal nonradiating chest pain intermittently for the past 2 weeks. He reports he last had chest pain last night while he was driving. He reports his pain lasts for approximately 1 hour and then resolve spontaneously. He is unable to identify alleviating or aggravating factors. His pain is not worse with exertion. He last had chest pain last night. No chest pain today. He also complains of a bump to his right chest wall for the past week. He reports an abscess in the same location on his left side previously. No discharge from the site. He denies personal or close family history of MI, DVT or PE. He is a former smoker. He has diabetes and takes insulin. He is not followed by primary care doctor. He reports he does not have a way to check his blood sugar at home. He denies fevers, coughing, shortness of breath, leg pain, leg swelling, recent long travel, lightheadedness, dizziness, syncope.   Patient is a 35 y.o. male presenting with chest pain. The history is provided by the patient. No language interpreter was used.  Chest Pain Associated symptoms: no abdominal pain, no back pain, no cough, no dizziness, no fever, no headache, no nausea, no palpitations, no shortness of breath, not vomiting and no weakness     Past Medical History  Diagnosis Date  . Diabetes mellitus   . Cataract   . Sarcoidosis [facility])    Past Surgical History  Procedure Laterality Date  . Cataract extraction w/ intraocular lens  implant, bilateral     No family history on file. Social History  Substance Use Topics  . Smoking status: Former Smoker -- 0.00 packs/day  . Smokeless tobacco: None  . Alcohol Use: Yes     Comment: occasional    Review of  Systems  Constitutional: Negative for fever and chills.  HENT: Negative for congestion and sore throat.   Eyes: Negative for visual disturbance.  Respiratory: Negative for cough, shortness of breath and wheezing.   Cardiovascular: Positive for chest pain. Negative for palpitations and leg swelling.  Gastrointestinal: Negative for nausea, vomiting and abdominal pain.  Genitourinary: Negative for dysuria and difficulty urinating.  Musculoskeletal: Positive for arthralgias. Negative for back pain and neck pain.  Skin: Negative for rash.  Neurological: Negative for dizziness, syncope, weakness, light-headedness and headaches.      Allergies  Prednisone  Home Medications   Prior to Admission medications   Medication Sig Start Date End Date Taking? Authorizing Provider  Blood Glucose Monitoring Suppl (TRUE METRIX METER) w/Device KIT 1 Device by Does not apply route once. 08/17/15   Waynetta Pean, PA-C  insulin aspart protamine- aspart (NOVOLOG MIX 70/30) (70-30) 100 UNIT/ML injection Inject 0.5 mLs (50 Units total) into the skin 2 (two) times daily with a meal. 08/17/15   Waynetta Pean, PA-C  omeprazole (PRILOSEC) 20 MG capsule Take 1 capsule (20 mg total) by mouth daily. 08/17/15   Waynetta Pean, PA-C  sulfamethoxazole-trimethoprim (BACTRIM DS,SEPTRA DS) 800-160 MG tablet Take 1 tablet by mouth 2 (two) times daily. 08/17/15 08/24/15  Waynetta Pean, PA-C   BP 159/101 mmHg  Pulse 69  Temp(Src) 98 F (36.7 C) (Oral)  Resp 13  Ht  '6\' 1"'  (1.854 m)  Wt 146.512 kg  BMI 42.62 kg/m2  SpO2 100% Physical Exam  Constitutional: He appears well-developed and well-nourished. No distress.  Nontoxic appearing.  HENT:  Head: Normocephalic and atraumatic.  Right Ear: External ear normal.  Left Ear: External ear normal.  Mouth/Throat: Oropharynx is clear and moist.  Eyes: Conjunctivae are normal. Pupils are equal, round, and reactive to light. Right eye exhibits no discharge. Left eye exhibits no  discharge.  Neck: Normal range of motion. Neck supple. No JVD present. No tracheal deviation present.  Cardiovascular: Normal rate, regular rhythm, normal heart sounds and intact distal pulses.  Exam reveals no gallop and no friction rub.   No murmur heard. Bilateral posterior tibialis and radial pulses are intact.  Pulmonary/Chest: Effort normal and breath sounds normal. No stridor. No respiratory distress. He has no wheezes. He has no rales. He exhibits tenderness.  Lungs are clear to auscultation bilaterally. Substernal chest wall is tender to palpation reproduces his chest pain. Patient also has an indurated area that is noted to palpation of his right chest wall beneath his breast.   Abdominal: Soft. There is no tenderness. There is no guarding.  Musculoskeletal: He exhibits no edema or tenderness.  No lower extremity edema or tenderness.  Lymphadenopathy:    He has no cervical adenopathy.  Neurological: He is alert. Coordination normal.  Skin: Skin is warm and dry. No rash noted. He is not diaphoretic. No erythema. No pallor.  Psychiatric: He has a normal mood and affect. His behavior is normal.  Nursing note and vitals reviewed.   ED Course  .Marland KitchenIncision and Drainage Date/Time: 08/17/2015 3:45 PM Performed by: Waynetta Pean Authorized by: Waynetta Pean Consent: Verbal consent obtained. Risks and benefits: risks, benefits and alternatives were discussed Consent given by: patient Patient understanding: patient states understanding of the procedure being performed Patient consent: the patient's understanding of the procedure matches consent given Procedure consent: procedure consent matches procedure scheduled Relevant documents: relevant documents present and verified Test results: test results available and properly labeled Site marked: the operative site was marked Imaging studies: imaging studies available Required items: required blood products, implants, devices, and  special equipment available Patient identity confirmed: verbally with patient Time out: Immediately prior to procedure a "time out" was called to verify the correct patient, procedure, equipment, support staff and site/side marked as required. Type: abscess Body area: trunk Location details: chest Anesthesia: local infiltration Local anesthetic: lidocaine 2% with epinephrine Anesthetic total: 3 ml Patient sedated: no Scalpel size: 11 Needle gauge: 18 Incision type: single straight Incision depth: dermal Complexity: complex Drainage: purulent Drainage amount: moderate Wound treatment: wound left open Packing material: none Patient tolerance: Patient tolerated the procedure well with no immediate complications Comments: I&D by PA student with myself supervising and assisting at bedside.    (including critical care time) Labs Review Labs Reviewed  BASIC METABOLIC PANEL - Abnormal; Notable for the following:    Glucose, Bld 328 (*)    Calcium 8.7 (*)    All other components within normal limits  CBC WITH DIFFERENTIAL/PLATELET - Abnormal; Notable for the following:    Hemoglobin 12.6 (*)    HCT 37.5 (*)    All other components within normal limits  CBG MONITORING, ED - Abnormal; Notable for the following:    Glucose-Capillary 295 (*)    All other components within normal limits  TROPONIN I    Imaging Review Dg Chest 2 View  08/17/2015  CLINICAL DATA:  Chest pain,  knot under right breast for 1 week EXAM: CHEST  2 VIEW COMPARISON:  12/18/2014 FINDINGS: Cardiomediastinal silhouette is stable. No acute infiltrate or pleural effusion. No pulmonary edema. Bony thorax is unremarkable. IMPRESSION: No active cardiopulmonary disease. Electronically Signed   By: Lahoma Crocker M.D.   On: 08/17/2015 14:06   EMERGENCY DEPARTMENT US SOFT TISSUE INTERPRETATION "Study: Limited Ultrasound of the noted body part in comments below"  INDICATIONS: Pain Multiple views of the body part are obtained  with a multi-frequency linear probe  PERFORMED BY:  Myself  IMAGES ARCHIVED?: Yes  SIDE:Right   BODY PART:Chest Wall  FINDINGS: Abcess present  LIMITATIONS:  Body Habitus  INTERPRETATION:  Abcess present  COMMENT:  Abscess present to right chest wall.    I have personally reviewed and evaluated these images and lab results as part of my medical decision-making.   EKG Interpretation   Date/Time:  Wednesday Aug 17 2015 13:06:45 EDT Ventricular Rate:  88 PR Interval:  156 QRS Duration: 96 QT Interval:  378 QTC Calculation: 457 R Axis:   46 Text Interpretation:  Normal sinus rhythm Normal ECG No significant change  since last tracing Confirmed by [facility] MD, Corene Cornea [phone]) on 08/17/2015  1:11:48 PM Also confirmed by [facility] MD, JASON [phone]), editor [place],  Joelene Millin [phone])  on 08/17/2015 1:48:38 PM     Filed Vitals:   08/17/15 1306 08/17/15 1430 08/17/15 1548  BP: 124/90 128/81 159/101  Pulse: 88 85 69  Temp: 98 F (36.7 C)    TempSrc: Oral    Resp: '18 20 13  ' Height: '6\' 1"'  (1.854 m)    Weight: 146.512 kg    SpO2: 100% 98% 100%    MDM   Meds given in ED:  Medications  gi cocktail (Maalox,Lidocaine,Donnatal) (30 mLs Oral Given 08/17/15 1430)  lidocaine-EPINEPHrine (XYLOCAINE W/EPI) 2 %-1:200000 (PF) injection 10 mL (10 mLs Infiltration Given 08/17/15 1521)    Discharge Medication List as of 08/17/2015  3:49 PM    START taking these medications   Details  omeprazole (PRILOSEC) 20 MG capsule Take 1 capsule (20 mg total) by mouth daily., Starting 08/17/2015, Until Discontinued, Print    sulfamethoxazole-trimethoprim (BACTRIM DS,SEPTRA DS) 800-160 MG tablet Take 1 tablet by mouth 2 (two) times daily., Starting 08/17/2015, Until Wed 08/24/15, Print        Final diagnoses:  Chest pain, unspecified chest pain type  Gastroesophageal reflux disease, esophagitis presence not specified  Abscess   This is a 35 y.o. male Who presents to the emergency department complaining of  substernal nonradiating chest pain intermittently for the past 2 weeks. He reports he last had chest pain last night while he was driving. He reports his pain lasts for approximately 1 hour and then resolve spontaneously. He is unable to identify alleviating or aggravating factors. His pain is not worse with exertion. He last had chest pain last night. No chest pain today. He also complains of a bump to his right chest wall for the past week. He reports an abscess in the same location on his left side previously. No discharge from the site. He denies personal or close family history of MI, DVT or PE. He is a former smoker. He has diabetes and takes insulin. He is not followed by primary care doctor. He reports he does not have a way to check his blood sugar at home. On exam the patient is afebrile and nontoxic appearing. His lungs clear to auscultation bilaterally. No murmurs, rubs or gallops. Abdomen is  soft and nontender to palpation. No extremity edema. He has tenderness and induration noted to his right chest wall beneath his breast. On ultrasound he appears to have an abscess with loculations.  I&D performed by PA student Pagie with my supervision and assistance. Moderate amount of purulent drainage was obtained. Patient appears to have multiple loculations. These were broken up somewhat and will place on bactrim.  Patient's blood sugar is noted to be 295. Normal anion gap. CBC is unremarkable. Troponin is not elevated. Chest x-ray is unremarkable. The patient has had no chest pain since last night. No chest pain on the emergency department. I see no need for delta trop at this time. HEART score 1.  He does tell me that he had improvement after GI cocktail. Will start on Prilosec. I encouraged him to follow-up with the wellness center and with cardiology for possible exercise stress test.  I provided him with prescriptions for glucometer with test strips and lancets to help him check his sugar. I also  refilled a prescription for his insulin. I encouraged him to follow-up with the wellness nurse they can help manage his diabetes. I discussed return precautions.     Waynetta Pean, PA-C 08/17/15 1626  Merrily Pew, MD 08/18/15 1754

## 2015-08-17 NOTE — ED Notes (Signed)
Pt placed on auto vitals Q30. Patient placed on cardiac monitor.  

## 2015-08-17 NOTE — ED Notes (Signed)
CP x 2 weeks-central-also c/o lumps to right sideof chest that are painful-NAD-steady gait

## 2015-09-07 ENCOUNTER — Inpatient Hospital Stay: Payer: Medicaid Other | Admitting: Internal Medicine

## 2015-10-25 ENCOUNTER — Emergency Department (HOSPITAL_BASED_OUTPATIENT_CLINIC_OR_DEPARTMENT_OTHER): Payer: Medicaid Other

## 2015-10-25 ENCOUNTER — Emergency Department (HOSPITAL_BASED_OUTPATIENT_CLINIC_OR_DEPARTMENT_OTHER)
Admission: EM | Admit: 2015-10-25 | Discharge: 2015-10-25 | Disposition: A | Discharge status: Home / Self Care | Payer: Medicaid Other | Attending: Emergency Medicine | Admitting: Emergency Medicine

## 2015-10-25 ENCOUNTER — Encounter (HOSPITAL_BASED_OUTPATIENT_CLINIC_OR_DEPARTMENT_OTHER): Payer: Self-pay

## 2015-10-25 DIAGNOSIS — L089 Local infection of the skin and subcutaneous tissue, unspecified: Secondary | ICD-10-CM

## 2015-10-25 DIAGNOSIS — Z87891 Personal history of nicotine dependence: Secondary | ICD-10-CM | POA: Insufficient documentation

## 2015-10-25 DIAGNOSIS — E1165 Type 2 diabetes mellitus with hyperglycemia: Secondary | ICD-10-CM | POA: Insufficient documentation

## 2015-10-25 DIAGNOSIS — L98491 Non-pressure chronic ulcer of skin of other sites limited to breakdown of skin: Secondary | ICD-10-CM

## 2015-10-25 DIAGNOSIS — B353 Tinea pedis: Secondary | ICD-10-CM | POA: Insufficient documentation

## 2015-10-25 DIAGNOSIS — R739 Hyperglycemia, unspecified: Secondary | ICD-10-CM

## 2015-10-25 DIAGNOSIS — B351 Tinea unguium: Secondary | ICD-10-CM | POA: Insufficient documentation

## 2015-10-25 DIAGNOSIS — L98499 Non-pressure chronic ulcer of skin of other sites with unspecified severity: Secondary | ICD-10-CM | POA: Insufficient documentation

## 2015-10-25 LAB — CBG MONITORING, ED: GLUCOSE-CAPILLARY: 301 mg/dL — AB (ref 65–99)

## 2015-10-25 MED ORDER — KETOCONAZOLE 2 % EX CREA: 1.0000 "application " | TOPICAL_CREAM | Freq: Every day | CUTANEOUS | Status: DC

## 2015-10-25 NOTE — Discharge Instructions (Signed)
Nail Ringworm A fungal infection of the nail (tinea unguium/onychomycosis) is common. It is common as the visible part of the nail is composed of dead cells which have no blood supply to help prevent infection. It occurs because fungi are everywhere and will pick any opportunity to grow on any dead material. Because nails are very slow growing they require up to 2 years of treatment with anti-fungal medications. The entire nail back to the base is infected. This includes approximately  of the nail which you cannot see. If your caregiver has prescribed a medication by mouth, take it every day and as directed. No progress will be seen for at least 6 to 9 months. Do not be disappointed! Because fungi live on dead cells with little or no exposure to blood supply, medication delivery to the infection is slow; thus the cure is slow. It is also why you can observe no progress in the first 6 months. The nail becoming cured is the base of the nail, as it has the blood supply. Topical medication such as creams and ointments are usually not effective. Important in successful treatment of nail fungus is closely following the medication regimen that your doctor prescribes. Sometimes you and your caregiver may elect to speed up this process by surgical removal of all the nails. Even this may still require 6 to 9 months of additional oral medications. See your caregiver as directed. Remember there will be no visible improvement for at least 6 months. See your caregiver sooner if other signs of infection (redness and swelling) develop.   This information is not intended to replace advice given to you by your health care provider. Make sure you discuss any questions you have with your health care provider.   Document Released: 03/30/2000 Document Revised: 08/17/2014 Document Reviewed: 10/04/2014 Elsevier Interactive Patient Education 2016 [employer]  Hyperglycemia Hyperglycemia occurs when the glucose (sugar) in  your blood is too high. Hyperglycemia can happen for many reasons, but it most often happens to people who do not know they have diabetes or are not managing their diabetes properly.  CAUSES  Whether you have diabetes or not, there are other causes of hyperglycemia. Hyperglycemia can occur when you have diabetes, but it can also occur in other situations that you might not be as aware of, such as: Diabetes  If you have diabetes and are having problems controlling your blood glucose, hyperglycemia could occur because of some of the following reasons:  Not following your meal plan.  Not taking your diabetes medications or not taking it properly.  Exercising less or doing less activity than you normally do.  Being sick. Pre-diabetes  This cannot be ignored. Before people develop Type 2 diabetes, they almost always have "pre-diabetes." This is when your blood glucose levels are higher than normal, but not yet high enough to be diagnosed as diabetes. Research has shown that some long-term damage to the body, especially the heart and circulatory system, may already be occurring during pre-diabetes. If you take action to manage your blood glucose when you have pre-diabetes, you may delay or prevent Type 2 diabetes from developing. Stress  If you have diabetes, you may be "diet" controlled or on oral medications or insulin to control your diabetes. However, you may find that your blood glucose is higher than usual in the hospital whether you have diabetes or not. This is often referred to as "stress hyperglycemia." Stress can elevate your blood glucose. This happens because of hormones put out  by the body during times of stress. If stress has been the cause of your high blood glucose, it can be followed regularly by your caregiver. That way he/she can make sure your hyperglycemia does not continue to get worse or progress to diabetes. Steroids  Steroids are medications that act on the infection  fighting system (immune system) to block inflammation or infection. One side effect can be a rise in blood glucose. Most people can produce enough extra insulin to allow for this rise, but for those who cannot, steroids make blood glucose levels go even higher. It is not unusual for steroid treatments to "uncover" diabetes that is developing. It is not always possible to determine if the hyperglycemia will go away after the steroids are stopped. A special blood test called an A1c is sometimes done to determine if your blood glucose was elevated before the steroids were started. SYMPTOMS  Thirsty.  Frequent urination.  Dry mouth.  Blurred vision.  Tired or fatigue.  Weakness.  Sleepy.  Tingling in feet or leg. DIAGNOSIS  Diagnosis is made by monitoring blood glucose in one or all of the following ways:  A1c test. This is a chemical found in your blood.  Fingerstick blood glucose monitoring.  Laboratory results. TREATMENT  First, knowing the cause of the hyperglycemia is important before the hyperglycemia can be treated. Treatment may include, but is not be limited to:  Education.  Change or adjustment in medications.  Change or adjustment in meal plan.  Treatment for an illness, infection, etc.  More frequent blood glucose monitoring.  Change in exercise plan.  Decreasing or stopping steroids.  Lifestyle changes. HOME CARE INSTRUCTIONS   Test your blood glucose as directed.  Exercise regularly. Your caregiver will give you instructions about exercise. Pre-diabetes or diabetes which comes on with stress is helped by exercising.  Eat wholesome, balanced meals. Eat often and at regular, fixed times. Your caregiver or nutritionist will give you a meal plan to guide your sugar intake.  Being at an ideal weight is important. If needed, losing as little as 10 to 15 pounds may help improve blood glucose levels. SEEK MEDICAL CARE IF:   You have questions about medicine,  activity, or diet.  You continue to have symptoms (problems such as increased thirst, urination, or weight gain). SEEK IMMEDIATE MEDICAL CARE IF:   You are vomiting or have diarrhea.  Your breath smells fruity.  You are breathing faster or slower.  You are very sleepy or incoherent.  You have numbness, tingling, or pain in your feet or hands.  You have chest pain.  Your symptoms get worse even though you have been following your caregiver's orders.  If you have any other questions or concerns.   This information is not intended to replace advice given to you by your health care provider. Make sure you discuss any questions you have with your health care provider.   Document Released: 09/26/2000 Document Revised: 06/25/2011 Document Reviewed: 12/07/2014 Elsevier Interactive Patient Education 2016 [employer] Inc[employer]   Athlete's Foot Athlete's foot (tinea pedis) is a fungal infection of the skin on the feet. It often occurs on the skin between the toes or underneath the toes. It can also occur on the soles of the feet. Athlete's foot is more likely to occur in hot, humid weather. Not washing your feet or changing your socks often enough can contribute to athlete's foot. The infection can spread from person to person (contagious). CAUSES Athlete's foot is caused by  a fungus. This fungus thrives in warm, moist places. Most people get athlete's foot by sharing shower stalls, towels, and wet floors with an infected person. People with weakened immune systems, including those with diabetes, may be more likely to get athlete's foot. SYMPTOMS   Itchy areas between the toes or on the soles of the feet.  White, flaky, or scaly areas between the toes or on the soles of the feet.  Tiny, intensely itchy blisters between the toes or on the soles of the feet.  Tiny cuts on the skin. These cuts can develop a bacterial infection.  Thick or discolored toenails. DIAGNOSIS  Your caregiver can  usually tell what the problem is by doing a physical exam. Your caregiver may also take a skin sample from the rash area. The skin sample may be examined under a microscope, or it may be tested to see if fungus will grow in the sample. A sample may also be taken from your toenail for testing. TREATMENT  Over-the-counter and prescription medicines can be used to kill the fungus. These medicines are available as powders or creams. Your caregiver can suggest medicines for you. Fungal infections respond slowly to treatment. You may need to continue using your medicine for several weeks. PREVENTION   Do not share towels.  Wear sandals in wet areas, such as shared locker rooms and shared showers.  Keep your feet dry. Wear shoes that allow air to circulate. Wear cotton or wool socks. HOME CARE INSTRUCTIONS   Take medicines as directed by your caregiver. Do not use steroid creams on athlete's foot.  Keep your feet clean and cool. Wash your feet daily and dry them thoroughly, especially between your toes.  Change your socks every day. Wear cotton or wool socks. In hot climates, you may need to change your socks 2 to 3 times per day.  Wear sandals or canvas tennis shoes with good air circulation.  If you have blisters, soak your feet in Burow's solution or Epsom salts for 20 to 30 minutes, 2 times a day to dry out the blisters. Make sure you dry your feet thoroughly afterward. SEEK MEDICAL CARE IF:   You have a fever.  You have swelling, soreness, warmth, or redness in your foot.  You are not getting better after 7 days of treatment.  You are not completely cured after 30 days.  You have any problems caused by your medicines. MAKE SURE YOU:   Understand these instructions.  Will watch your condition.  Will get help right away if you are not doing well or get worse.   This information is not intended to replace advice given to you by your health care provider. Make sure you discuss any  questions you have with your health care provider.   Document Released: 03/30/2000 Document Revised: 06/25/2011 Document Reviewed: 10/04/2014 Elsevier Interactive Patient Education [employer]

## 2015-10-25 NOTE — ED Provider Notes (Signed)
CSN: [id number]     Arrival date & time 10/25/15  1629 History  By signing my name below, I, Irene Pap, attest that this documentation has been prepared under the direction and in the presence of Margarita Mail, PA-C. Electronically Signed: Irene Pap, ED Scribe. 10/25/2015. 4:53 PM.   Chief Complaint  Patient presents with  . Toe Pain   The history is provided by the patient. No language interpreter was used.  HPI Comments: Kiwan Gadsden is a 35 y.o. male with a hx of DM and sarcoidosis who presents to the Emergency Department complaining of peeling skin on the left third toe onset 2 days ago. Pt states that he noticed split skin on the toe two days ago and has now progressed. Pt reports that he typically gets his toes done at a salon. He was seen by a podiatrist and given medication to relief. He denies arthralgias, gait problem, rash, wound, weakness, and numbness. He is allergic to Prednisone.   Past Medical History  Diagnosis Date  . Diabetes mellitus   . Cataract   . Sarcoidosis [facility])    Past Surgical History  Procedure Laterality Date  . Cataract extraction w/ intraocular lens  implant, bilateral     No family history on file. Social History  Substance Use Topics  . Smoking status: Former Smoker -- 0.00 packs/day  . Smokeless tobacco: None  . Alcohol Use: Yes     Comment: occasional    Review of Systems  Musculoskeletal: Negative for arthralgias and gait problem.  Skin: Negative for rash and wound.       Peeling skin  Neurological: Negative for weakness and numbness.   Allergies  Prednisone  Home Medications   Prior to Admission medications   Medication Sig Start Date End Date Taking? Authorizing Provider  Blood Glucose Monitoring Suppl (TRUE METRIX METER) w/Device KIT 1 Device by Does not apply route once. 08/17/15   Waynetta Pean, PA-C  insulin aspart protamine- aspart (NOVOLOG MIX 70/30) (70-30) 100 UNIT/ML injection Inject 0.5 mLs (50 Units total)  into the skin 2 (two) times daily with a meal. 08/17/15   Waynetta Pean, PA-C   BP 137/85 mmHg  Pulse 100  Temp(Src) 98.3 F (36.8 C) (Oral)  Resp 20  Ht 6' (1.829 m)  Wt 313 lb (141.976 kg)  BMI 42.44 kg/m2  SpO2 95% Physical Exam  Constitutional: He is oriented to person, place, and time. He appears well-developed and well-nourished. No distress.  HENT:  Head: Normocephalic and atraumatic.  Mouth/Throat: Oropharynx is clear and moist. No oropharyngeal exudate.  Eyes: Conjunctivae and EOM are normal. Pupils are equal, round, and reactive to light.  Neck: Normal range of motion. Neck supple.  Musculoskeletal: Normal range of motion.  Normal pulses  Neurological: He is alert and oriented to person, place, and time.  Skin: Skin is warm and dry.  Thickened toe nails; middle left toe with macerated skin on the medial side with ulceration and breakdown; foul odor present; darkened skin on the toe with no pain; there is macerated skin present in the webbing of multiple toes on the left and right foot.  Psychiatric: He has a normal mood and affect. His behavior is normal.    ED Course  Procedures (including critical care time) DIAGNOSTIC STUDIES: Oxygen Saturation is 95% on RA, normal by my interpretation.    COORDINATION OF CARE: 4:58 PM-Discussed treatment plan which includes labs and x-ray with pt at bedside and pt agreed to plan.  Labs Review Labs Reviewed  CBG MONITORING, ED    Imaging Review No results found. I have personally reviewed and evaluated these images and lab results as part of my medical decision-making.   EKG Interpretation None      MDM   Patient X-Ray negative for obvious fracture or dislocation. Pain managed in ED. Pt advised to follow up with podiatry for macerated tinea and onychomycosis. Also f/u with pcp for blood sugar mgmt.Patient will be dc home & is agreeable with above plan.  Final diagnoses:  Toe infection  Tinea pedis, recurrent   Tinea unguium  Hyperglycemia  Skin ulceration, limited to breakdown of skin ([place])    I personally performed the services described in this documentation, which was scribed in my presence. The recorded information has been reviewed and is accurate.       Margarita Mail, PA-C 10/25/15 [place], DO 10/25/15 1849

## 2015-10-25 NOTE — ED Notes (Signed)
MD at bedside. 

## 2015-10-25 NOTE — ED Notes (Signed)
Pt c/o peeling skin on left 3rd toe-NAD-steady gait

## 2015-11-09 ENCOUNTER — Ambulatory Visit: Payer: Self-pay | Admitting: Podiatry

## 2015-11-21 ENCOUNTER — Encounter: Payer: Self-pay | Admitting: Podiatry

## 2015-12-15 NOTE — Progress Notes (Signed)
This encounter was created in error - please disregard.

## 2016-03-30 ENCOUNTER — Encounter (HOSPITAL_BASED_OUTPATIENT_CLINIC_OR_DEPARTMENT_OTHER): Payer: Self-pay | Admitting: *Deleted

## 2016-03-30 ENCOUNTER — Emergency Department (HOSPITAL_BASED_OUTPATIENT_CLINIC_OR_DEPARTMENT_OTHER)
Admission: EM | Admit: 2016-03-30 | Discharge: 2016-03-31 | Disposition: A | Discharge status: Home / Self Care | Payer: Medicaid Other | Attending: Emergency Medicine | Admitting: Emergency Medicine

## 2016-03-30 DIAGNOSIS — S50361A Insect bite (nonvenomous) of right elbow, initial encounter: Secondary | ICD-10-CM | POA: Insufficient documentation

## 2016-03-30 DIAGNOSIS — E119 Type 2 diabetes mellitus without complications: Secondary | ICD-10-CM | POA: Insufficient documentation

## 2016-03-30 DIAGNOSIS — Z794 Long term (current) use of insulin: Secondary | ICD-10-CM | POA: Insufficient documentation

## 2016-03-30 DIAGNOSIS — Y999 Unspecified external cause status: Secondary | ICD-10-CM | POA: Insufficient documentation

## 2016-03-30 DIAGNOSIS — W57XXXA Bitten or stung by nonvenomous insect and other nonvenomous arthropods, initial encounter: Secondary | ICD-10-CM | POA: Insufficient documentation

## 2016-03-30 DIAGNOSIS — L299 Pruritus, unspecified: Secondary | ICD-10-CM

## 2016-03-30 DIAGNOSIS — Z87891 Personal history of nicotine dependence: Secondary | ICD-10-CM | POA: Insufficient documentation

## 2016-03-30 DIAGNOSIS — Y939 Activity, unspecified: Secondary | ICD-10-CM | POA: Insufficient documentation

## 2016-03-30 DIAGNOSIS — S40861A Insect bite (nonvenomous) of right upper arm, initial encounter: Secondary | ICD-10-CM | POA: Insufficient documentation

## 2016-03-30 DIAGNOSIS — Y929 Unspecified place or not applicable: Secondary | ICD-10-CM | POA: Insufficient documentation

## 2016-03-30 NOTE — ED Provider Notes (Signed)
[facility] DEPT [facility] Provider Note   CSN: [id number] Arrival date & time: 03/30/16  2329  By signing my name below, I, Emmanuella Mensah, attest that this documentation has been prepared under the direction and in the presence of Quincy Carnes, PA-C. Electronically Signed: Judithann Sauger, ED Scribe. 03/30/16. 12:03 AM.   History   Chief Complaint Chief Complaint  Patient presents with  . Pruritis    HPI Comments: Ronnie Shaw is a 35 y.o. male with a hx of DM and sarcoidosis who presents to the Emergency Department complaining of sudden onset, persistent generalized pruritis onset today. No itching in between fingers. He reports associated multiple raised areas to his right upper arm and left elbow. He explains that he believes that he was bitten by insects while sleeping prior to onset of these symptoms. He denies any new soaps, detergent, or lotions but states that he believes his friend's dog who he was around yesterday may have fleas but he has not seen any around the house.  States new mattress so does not believe it is bed bugs. He also denies any sick contacts or any one else in his house with similar symptoms. No alleviating or exacerbating factors noted. Pt has not tried any medications PTA. He has an allergy to Prednisone.  He denies any fever, chills, shortness of breath, trouble swallowing, sore throat, lip/tongue swelling, generalized rash, or any other symptoms.   The history is provided by the patient. No language interpreter was used.    Past Medical History:  Diagnosis Date  . Cataract   . Diabetes mellitus   . Sarcoidosis ([place])     There are no active problems to display for this patient.   Past Surgical History:  Procedure Laterality Date  . CATARACT EXTRACTION W/ INTRAOCULAR LENS  IMPLANT, BILATERAL         Home Medications    Prior to Admission medications   Medication Sig Start Date End Date Taking? Authorizing Provider  Blood Glucose  Monitoring Suppl (TRUE METRIX METER) w/Device KIT 1 Device by Does not apply route once. 08/17/15   Waynetta Pean, PA-C  insulin aspart protamine- aspart (NOVOLOG MIX 70/30) (70-30) 100 UNIT/ML injection Inject 0.5 mLs (50 Units total) into the skin 2 (two) times daily with a meal. 08/17/15   Waynetta Pean, PA-C  ketoconazole (NIZORAL) 2 % cream Apply 1 application topically daily. Apply to the feet and in between the toes, do not apply where there is skin breakdown 10/25/15   Margarita Mail, PA-C    Family History History reviewed. No pertinent family history.  Social History Social History  Substance Use Topics  . Smoking status: Former Smoker    Packs/day: 0.00  . Smokeless tobacco: Not on file  . Alcohol use Yes     Comment: occasional     Allergies   Prednisone   Review of Systems Review of Systems  Constitutional: Negative for chills and fever.  HENT: Negative for facial swelling, sore throat and trouble swallowing.   Respiratory: Negative for shortness of breath.   Skin: Negative for rash.       Generalized pruritis  All other systems reviewed and are negative.    Physical Exam Updated Vital Signs BP 131/93 (BP Location: Left Arm)   Pulse 88   Temp 97.6 F (36.4 C) (Oral)   Resp 18   SpO2 99%   Physical Exam  Constitutional: He is oriented to person, place, and time. He appears well-developed and well-nourished.  HENT:  Head: Normocephalic and atraumatic.  Mouth/Throat: Oropharynx is clear and moist.  No oral lesions, no oral swelling  Eyes: Conjunctivae and EOM are normal. Pupils are equal, round, and reactive to light.  Neck: Normal range of motion.  Cardiovascular: Normal rate, regular rhythm and normal heart sounds.   Pulmonary/Chest: Effort normal and breath sounds normal. No respiratory distress. He has no wheezes.  Abdominal: Soft. Bowel sounds are normal.  Musculoskeletal: Normal range of motion.  Neurological: He is alert and oriented to person,  place, and time.  Skin: Skin is warm and dry.  Bug bite appearing lesions to right upper arm and left elbow; no surrounding swelling, induration, or signs of cellulitis; no lesions on palms/soles  Psychiatric: He has a normal mood and affect.  Nursing note and vitals reviewed.    ED Treatments / Results  DIAGNOSTIC STUDIES: Oxygen Saturation is 99% on RA, normal by my interpretation.    COORDINATION OF CARE: 12:01 AM- Pt advised of plan for treatment and pt agrees. Pt will receive Benadryl and permethrin cream.     Labs (all labs ordered are listed, but only abnormal results are displayed) Labs Reviewed - No data to display  EKG  EKG Interpretation None       Radiology No results found.  Procedures Procedures (including critical care time)  Medications Ordered in ED Medications - No data to display   Initial Impression / Assessment and Plan / ED Course  Quincy Carnes, PA-C has reviewed the triage vital signs and the nursing notes.  Pertinent labs & imaging results that were available during my care of the patient were reviewed by me and considered in my medical decision making (see chart for details).  Clinical Course    35 year old male here with bug bites of his arms. Noticed this today. Was around his friend's dog yesterday who he thinks has fleas. No new soaps or detergents at home. Has bug bite appearing lesions on exam. The diffuse rash. No signs of superimposed infection or cellulitis. No lesions on the palms or soles. Will treat with Benadryl and permethrin given his questionable flea exposure. Recommended to follow-up with PCP.  Discussed plan with patient, he acknowledged understanding and agreed with plan of care.  Return precautions given for new or worsening symptoms.   Final Clinical Impressions(s) / ED Diagnoses   Final diagnoses:  Bug bite, initial encounter  Itching    New Prescriptions New Prescriptions   DIPHENHYDRAMINE (BENADRYL) 25 MG TABLET     Take 1 tablet (25 mg total) by mouth every 6 (six) hours as needed for itching ([place]).   PERMETHRIN (ELIMITE) 5 % CREAM    Apply to entire body other than face - let sit for 14 hours then wash off, may repeat in 1 week if still having symptoms   I personally performed the services described in this documentation, which was scribed in my presence. The recorded information has been reviewed and is accurate.   Larene Pickett, PA-C 03/31/16 0025    Veryl Speak, MD 03/31/16 [phone]

## 2016-03-30 NOTE — ED Triage Notes (Signed)
p reports insect bite to right elbow.  Generalized itching.  No evidence of insect bite in triage-appears to have dry skin on elbow.

## 2016-03-30 NOTE — ED Notes (Signed)
ED Provider at bedside. 

## 2016-03-31 MED ORDER — DIPHENHYDRAMINE HCL 25 MG PO TABS: 25.0000 mg | ORAL_TABLET | Freq: Four times a day (QID) | ORAL | 0 refills | Status: DC | PRN

## 2016-03-31 MED ORDER — PERMETHRIN 5 % EX CREA: TOPICAL_CREAM | CUTANEOUS | 1 refills | Status: DC

## 2016-03-31 NOTE — Discharge Instructions (Signed)
Take the prescribed medication as directed.  Recommend to wash your sheets, linens, and towels in hot water. Follow-up with your primary care doctor. Return to the ED for new or worsening symptoms.

## 2017-09-16 ENCOUNTER — Emergency Department (HOSPITAL_COMMUNITY): Payer: Self-pay

## 2017-09-16 ENCOUNTER — Other Ambulatory Visit: Payer: Self-pay

## 2017-09-16 ENCOUNTER — Inpatient Hospital Stay (HOSPITAL_COMMUNITY)
Admission: EM | Admit: 2017-09-16 | Discharge: 2017-09-21 | DRG: 617 | Disposition: A | Discharge status: Home / Self Care | Payer: Self-pay | Attending: Family Medicine | Admitting: Family Medicine

## 2017-09-16 ENCOUNTER — Encounter (HOSPITAL_COMMUNITY): Payer: Self-pay | Admitting: *Deleted

## 2017-09-16 ENCOUNTER — Inpatient Hospital Stay (HOSPITAL_COMMUNITY): Payer: Self-pay

## 2017-09-16 DIAGNOSIS — Z87891 Personal history of nicotine dependence: Secondary | ICD-10-CM

## 2017-09-16 DIAGNOSIS — Z6837 Body mass index (BMI) 37.0-37.9, adult: Secondary | ICD-10-CM

## 2017-09-16 DIAGNOSIS — M869 Osteomyelitis, unspecified: Secondary | ICD-10-CM | POA: Diagnosis present

## 2017-09-16 DIAGNOSIS — N179 Acute kidney failure, unspecified: Secondary | ICD-10-CM | POA: Diagnosis not present

## 2017-09-16 DIAGNOSIS — M86171 Other acute osteomyelitis, right ankle and foot: Secondary | ICD-10-CM | POA: Diagnosis present

## 2017-09-16 DIAGNOSIS — E1169 Type 2 diabetes mellitus with other specified complication: Principal | ICD-10-CM | POA: Diagnosis present

## 2017-09-16 DIAGNOSIS — Z9841 Cataract extraction status, right eye: Secondary | ICD-10-CM

## 2017-09-16 DIAGNOSIS — Z9114 Patient's other noncompliance with medication regimen: Secondary | ICD-10-CM

## 2017-09-16 DIAGNOSIS — E871 Hypo-osmolality and hyponatremia: Secondary | ICD-10-CM | POA: Diagnosis present

## 2017-09-16 DIAGNOSIS — I10 Essential (primary) hypertension: Secondary | ICD-10-CM | POA: Diagnosis present

## 2017-09-16 DIAGNOSIS — D638 Anemia in other chronic diseases classified elsewhere: Secondary | ICD-10-CM | POA: Diagnosis present

## 2017-09-16 DIAGNOSIS — D869 Sarcoidosis, unspecified: Secondary | ICD-10-CM | POA: Diagnosis present

## 2017-09-16 DIAGNOSIS — E669 Obesity, unspecified: Secondary | ICD-10-CM | POA: Diagnosis present

## 2017-09-16 DIAGNOSIS — E1165 Type 2 diabetes mellitus with hyperglycemia: Secondary | ICD-10-CM | POA: Diagnosis present

## 2017-09-16 DIAGNOSIS — R319 Hematuria, unspecified: Secondary | ICD-10-CM | POA: Diagnosis present

## 2017-09-16 DIAGNOSIS — Z9842 Cataract extraction status, left eye: Secondary | ICD-10-CM

## 2017-09-16 DIAGNOSIS — E11628 Type 2 diabetes mellitus with other skin complications: Secondary | ICD-10-CM | POA: Diagnosis present

## 2017-09-16 DIAGNOSIS — E1142 Type 2 diabetes mellitus with diabetic polyneuropathy: Secondary | ICD-10-CM | POA: Diagnosis present

## 2017-09-16 DIAGNOSIS — Z888 Allergy status to other drugs, medicaments and biological substances status: Secondary | ICD-10-CM

## 2017-09-16 DIAGNOSIS — E11621 Type 2 diabetes mellitus with foot ulcer: Secondary | ICD-10-CM | POA: Diagnosis present

## 2017-09-16 DIAGNOSIS — M86271 Subacute osteomyelitis, right ankle and foot: Secondary | ICD-10-CM | POA: Diagnosis present

## 2017-09-16 DIAGNOSIS — L97519 Non-pressure chronic ulcer of other part of right foot with unspecified severity: Secondary | ICD-10-CM | POA: Diagnosis present

## 2017-09-16 DIAGNOSIS — K5900 Constipation, unspecified: Secondary | ICD-10-CM | POA: Diagnosis present

## 2017-09-16 DIAGNOSIS — B951 Streptococcus, group B, as the cause of diseases classified elsewhere: Secondary | ICD-10-CM | POA: Diagnosis present

## 2017-09-16 DIAGNOSIS — D649 Anemia, unspecified: Secondary | ICD-10-CM | POA: Diagnosis present

## 2017-09-16 DIAGNOSIS — Z794 Long term (current) use of insulin: Secondary | ICD-10-CM

## 2017-09-16 DIAGNOSIS — Z961 Presence of intraocular lens: Secondary | ICD-10-CM | POA: Diagnosis present

## 2017-09-16 LAB — CBC WITH DIFFERENTIAL/PLATELET
BASOS PCT: 0 %
Basophils Absolute: 0 10*3/uL (ref 0.0–0.1)
Eosinophils Absolute: 0 10*3/uL (ref 0.0–0.7)
Eosinophils Relative: 0 %
HEMATOCRIT: 36 % — AB (ref 39.0–52.0)
HEMOGLOBIN: 12.4 g/dL — AB (ref 13.0–17.0)
LYMPHS PCT: 15 %
Lymphs Abs: 2.2 10*3/uL (ref 0.7–4.0)
MCH: 29.3 pg (ref 26.0–34.0)
MCHC: 34.4 g/dL (ref 30.0–36.0)
MCV: 85.1 fL (ref 78.0–100.0)
MONOS PCT: 9 %
Monocytes Absolute: 1.3 10*3/uL — ABNORMAL HIGH (ref 0.1–1.0)
NEUTROS PCT: 76 %
Neutro Abs: 11.1 10*3/uL — ABNORMAL HIGH (ref 1.7–7.7)
Platelets: 334 10*3/uL (ref 150–400)
RBC: 4.23 MIL/uL (ref 4.22–5.81)
RDW: 12.3 % (ref 11.5–15.5)
WBC: 14.6 10*3/uL — AB (ref 4.0–10.5)

## 2017-09-16 LAB — SEDIMENTATION RATE: Sed Rate: 102 mm/hr — ABNORMAL HIGH (ref 0–16)

## 2017-09-16 LAB — BASIC METABOLIC PANEL
ANION GAP: 14 (ref 5–15)
BUN: 12 mg/dL (ref 6–20)
CHLORIDE: 90 mmol/L — AB (ref 101–111)
CO2: 23 mmol/L (ref 22–32)
Calcium: 9 mg/dL (ref 8.9–10.3)
Creatinine, Ser: 0.96 mg/dL (ref 0.61–1.24)
GFR calc Af Amer: >60 mL/min (ref >60)
GLUCOSE: 391 mg/dL — AB (ref 65–99)
POTASSIUM: 4.3 mmol/L (ref 3.5–5.1)
Sodium: 127 mmol/L — ABNORMAL LOW (ref 135–145)

## 2017-09-16 LAB — URINALYSIS, ROUTINE W REFLEX MICROSCOPIC
Bacteria, UA: NONE SEEN
Bilirubin Urine: NEGATIVE
Glucose, UA: >=500 mg/dL — AB
KETONES UR: 20 mg/dL — AB
LEUKOCYTES UA: NEGATIVE
Nitrite: NEGATIVE
PROTEIN: NEGATIVE mg/dL
Specific Gravity, Urine: 1.031 — ABNORMAL HIGH (ref 1.005–1.030)
pH: 6 (ref 5.0–8.0)

## 2017-09-16 LAB — FERRITIN: Ferritin: 693 ng/mL — ABNORMAL HIGH (ref 24–336)

## 2017-09-16 LAB — I-STAT CG4 LACTIC ACID, ED: Lactic Acid, Venous: 1.25 mmol/L (ref 0.5–1.9)

## 2017-09-16 LAB — GLUCOSE, CAPILLARY
GLUCOSE-CAPILLARY: 353 mg/dL — AB (ref 65–99)
GLUCOSE-CAPILLARY: 332 mg/dL — AB (ref 65–99)
GLUCOSE-CAPILLARY: 267 mg/dL — AB (ref 65–99)
GLUCOSE-CAPILLARY: 275 mg/dL — AB (ref 65–99)
Glucose-Capillary: 287 mg/dL — ABNORMAL HIGH (ref 65–99)
Glucose-Capillary: 198 mg/dL — ABNORMAL HIGH (ref 65–99)

## 2017-09-16 LAB — IRON AND TIBC
Iron: 23 ug/dL — ABNORMAL LOW (ref 45–182)
Saturation Ratios: 10 % — ABNORMAL LOW (ref 17.9–39.5)
TIBC: 237 ug/dL — ABNORMAL LOW (ref 250–450)
UIBC: 214 ug/dL

## 2017-09-16 LAB — CBG MONITORING, ED: Glucose-Capillary: 364 mg/dL — ABNORMAL HIGH (ref 65–99)

## 2017-09-16 LAB — RETICULOCYTES
RBC.: 4.27 MIL/uL (ref 4.22–5.81)
RETIC COUNT ABSOLUTE: 51.2 10*3/uL (ref 19.0–186.0)
Retic Ct Pct: 1.2 % (ref 0.4–3.1)

## 2017-09-16 LAB — HEMOGLOBIN A1C
HEMOGLOBIN A1C: 13 % — AB (ref 4.8–5.6)
MEAN PLASMA GLUCOSE: 326.4 mg/dL

## 2017-09-16 LAB — VITAMIN B12: VITAMIN B 12: 371 pg/mL (ref 180–914)

## 2017-09-16 LAB — HIV ANTIBODY (ROUTINE TESTING W REFLEX): HIV Screen 4th Generation wRfx: NONREACTIVE

## 2017-09-16 LAB — FOLATE: FOLATE: 11.7 ng/mL (ref >5.9)

## 2017-09-16 MED ORDER — ACETAMINOPHEN 325 MG PO TABS
650.0000 mg | ORAL_TABLET | Freq: Four times a day (QID) | ORAL | Status: DC | PRN
  Administered 2017-09-18: 650 mg via ORAL
  Filled 2017-09-16: qty 2

## 2017-09-16 MED ORDER — SODIUM CHLORIDE 0.9 % IV SOLN
INTRAVENOUS | Status: AC
  Administered 2017-09-17: 01:00:00 via INTRAVENOUS

## 2017-09-16 MED ORDER — OXYCODONE HCL 5 MG PO TABS
5.0000 mg | ORAL_TABLET | Freq: Four times a day (QID) | ORAL | Status: DC | PRN
  Administered 2017-09-18: 5 mg via ORAL
  Filled 2017-09-16: qty 1

## 2017-09-16 MED ORDER — VANCOMYCIN HCL 10 G IV SOLR
1500.0000 mg | Freq: Two times a day (BID) | INTRAVENOUS | Status: DC
  Administered 2017-09-16 – 2017-09-18 (×4): 1500 mg via INTRAVENOUS
  Filled 2017-09-16 (×7): qty 1500

## 2017-09-16 MED ORDER — INSULIN ASPART 100 UNIT/ML ~~LOC~~ SOLN
0.0000 [IU] | Freq: Three times a day (TID) | SUBCUTANEOUS | Status: DC
  Administered 2017-09-16: 5 [IU] via SUBCUTANEOUS
  Administered 2017-09-16: 7 [IU] via SUBCUTANEOUS

## 2017-09-16 MED ORDER — VANCOMYCIN HCL 10 G IV SOLR
2500.0000 mg | INTRAVENOUS | Status: AC
  Administered 2017-09-16: 2500 mg via INTRAVENOUS
  Filled 2017-09-16: qty 2000

## 2017-09-16 MED ORDER — INSULIN ASPART 100 UNIT/ML ~~LOC~~ SOLN
0.0000 [IU] | Freq: Three times a day (TID) | SUBCUTANEOUS | Status: DC
  Administered 2017-09-16: 11 [IU] via SUBCUTANEOUS
  Administered 2017-09-17: 3 [IU] via SUBCUTANEOUS
  Administered 2017-09-17: 4 [IU] via SUBCUTANEOUS
  Administered 2017-09-17: 11 [IU] via SUBCUTANEOUS
  Administered 2017-09-18 – 2017-09-20 (×3): 4 [IU] via SUBCUTANEOUS
  Administered 2017-09-20 – 2017-09-21 (×2): 3 [IU] via SUBCUTANEOUS

## 2017-09-16 MED ORDER — ONDANSETRON HCL 4 MG/2ML IJ SOLN: 4.0000 mg | Freq: Four times a day (QID) | INTRAMUSCULAR | Status: DC | PRN

## 2017-09-16 MED ORDER — PIPERACILLIN-TAZOBACTAM 3.375 G IVPB
3.3750 g | Freq: Three times a day (TID) | INTRAVENOUS | Status: DC
  Administered 2017-09-16 – 2017-09-20 (×13): 3.375 g via INTRAVENOUS
  Filled 2017-09-16 (×14): qty 50

## 2017-09-16 MED ORDER — PIPERACILLIN-TAZOBACTAM 3.375 G IVPB 30 MIN
3.3750 g | INTRAVENOUS | Status: AC
  Administered 2017-09-16: 3.375 g via INTRAVENOUS
  Filled 2017-09-16: qty 50

## 2017-09-16 MED ORDER — ACETAMINOPHEN 650 MG RE SUPP: 650.0000 mg | Freq: Four times a day (QID) | RECTAL | Status: DC | PRN

## 2017-09-16 MED ORDER — VANCOMYCIN HCL 10 G IV SOLR: 2500.0000 mg | INTRAVENOUS | Status: DC

## 2017-09-16 MED ORDER — INSULIN ASPART PROT & ASPART (70-30 MIX) 100 UNIT/ML ~~LOC~~ SUSP
40.0000 [IU] | Freq: Two times a day (BID) | SUBCUTANEOUS | Status: DC
  Administered 2017-09-16 – 2017-09-21 (×10): 40 [IU] via SUBCUTANEOUS
  Filled 2017-09-16 (×2): qty 10

## 2017-09-16 MED ORDER — INSULIN ASPART 100 UNIT/ML ~~LOC~~ SOLN: 0.0000 [IU] | Freq: Every day | SUBCUTANEOUS | Status: DC

## 2017-09-16 MED ORDER — PIPERACILLIN-TAZOBACTAM 3.375 G IVPB 30 MIN: 3.3750 g | INTRAVENOUS | Status: DC

## 2017-09-16 MED ORDER — ONDANSETRON HCL 4 MG PO TABS: 4.0000 mg | ORAL_TABLET | Freq: Four times a day (QID) | ORAL | Status: DC | PRN

## 2017-09-16 NOTE — ED Notes (Signed)
Lab needs a lavender and two golds still.

## 2017-09-16 NOTE — Progress Notes (Signed)
A consult was received from an ED physician for zosyn and vancomycin per pharmacy dosing.  The patient's profile has been reviewed for ht/wt/allergies/indication/available labs.   A one time order has been placed for zosyn 3.375 Gm and Vancomycin 2500 mg.  Further antibiotics/pharmacy consults should be ordered by admitting physician if indicated.                       Thank you, Lorenza EvangelistGreen, Zurii Hewes R 09/16/2017  3:16 AM

## 2017-09-16 NOTE — ED Provider Notes (Signed)
[place] DEPT Provider Note   CSN: [id number] Arrival date & time: 09/16/17  0209     History   Chief Complaint Chief Complaint  Patient presents with  . Wound Infection    HPI Ronnie Shaw is a 37 y.o. male.  The history is provided by the patient and medical records.     37 year old male with history of cataracts, diabetes, sarcoidosis, presenting to the ED with right foot pain and swelling.  States he has a "sore" on the bottom of his right foot that has been present for a few days.  States he is not exactly sure how this happened, just noticed it one day.  States lately his foot has been swelling more and was told by someone in the pharmacy at [facility] but it was due to "fluid".  States last night it started bleeding and has become more painful.  He reports some subjective fever and chills.  States his sugars have been very high at home.  He does not currently have a primary care doctor so has been giving himself insulin when he can.  He does not get regular diabetic foot checks.  He mostly wears open toe sandals, does not feel when his shoes rub against his feet.  Past Medical History:  Diagnosis Date  . Cataract   . Diabetes mellitus   . Sarcoidosis     There are no active problems to display for this patient.   Past Surgical History:  Procedure Laterality Date  . CATARACT EXTRACTION W/ INTRAOCULAR LENS  IMPLANT, BILATERAL          Home Medications    Prior to Admission medications   Medication Sig Start Date End Date Taking? Authorizing Provider  Blood Glucose Monitoring Suppl (TRUE METRIX METER) w/Device KIT 1 Device by Does not apply route once. 08/17/15   Waynetta Pean, PA-C  diphenhydrAMINE (BENADRYL) 25 MG tablet Take 1 tablet (25 mg total) by mouth every 6 (six) hours as needed for itching ([place]). 03/31/16   Larene Pickett, PA-C  insulin aspart protamine- aspart (NOVOLOG MIX 70/30) (70-30) 100 UNIT/ML injection Inject  0.5 mLs (50 Units total) into the skin 2 (two) times daily with a meal. 08/17/15   Waynetta Pean, PA-C  ketoconazole (NIZORAL) 2 % cream Apply 1 application topically daily. Apply to the feet and in between the toes, do not apply where there is skin breakdown 10/25/15   Margarita Mail, PA-C  permethrin (ELIMITE) 5 % cream Apply to entire body other than face - let sit for 14 hours then wash off, may repeat in 1 week if still having symptoms 03/31/16   Larene Pickett, PA-C    Family History No family history on file.  Social History Social History   Tobacco Use  . Smoking status: Former Smoker    Packs/day: 0.00  Substance Use Topics  . Alcohol use: Yes    Comment: occasional  . Drug use: No     Allergies   Prednisone   Review of Systems Review of Systems  Skin: Positive for wound.  All other systems reviewed and are negative.    Physical Exam Updated Vital Signs BP 130/86 (BP Location: Left Arm)   Pulse (!) 106   Temp 99.9 F (37.7 C)   Resp 20   Ht _0  (1.854 m)   Wt 127 kg (280 lb)   SpO2 99%   BMI 36.94 kg/m   Physical Exam  Constitutional: He is oriented to  person, place, and time. He appears well-developed and well-nourished.  HENT:  Head: Normocephalic and atraumatic.  Mouth/Throat: Oropharynx is clear and moist.  Eyes: Pupils are equal, round, and reactive to light. Conjunctivae and EOM are normal.  Neck: Normal range of motion.  Cardiovascular: Normal rate, regular rhythm and normal heart sounds.  Pulmonary/Chest: Effort normal and breath sounds normal.  Abdominal: Soft. Bowel sounds are normal.  Musculoskeletal: Normal range of motion.  Large wounds to the outer aspect of ball of right foot; central eschar with some drainage and oozing small amounts of blood; little toe appears to be grossly infected with pus visible beneath skin; no tissue crepitus; some erythema over dorsum of right foot; no streaking of the leg Some old, healed wounds to tops of  toes of right foot and sole of left foot; these areas are hardened, not fluctuant  Neurological: He is alert and oriented to person, place, and time.  Skin: Skin is warm and dry.  Psychiatric: He has a normal mood and affect.  Nursing note and vitals reviewed.          ED Treatments / Results  Labs (all labs ordered are listed, but only abnormal results are displayed) Labs Reviewed  CBC WITH DIFFERENTIAL/PLATELET - Abnormal; Notable for the following components:      Result Value   WBC 14.6 (*)    Hemoglobin 12.4 (*)    HCT 36.0 (*)    All other components within normal limits  BASIC METABOLIC PANEL - Abnormal; Notable for the following components:   Sodium 127 (*)    Chloride 90 (*)    Glucose, Bld 391 (*)    All other components within normal limits  URINALYSIS, ROUTINE W REFLEX MICROSCOPIC - Abnormal; Notable for the following components:   Specific Gravity, Urine 1.031 (*)    Glucose, UA >=500 (*)    Hgb urine dipstick MODERATE (*)    Ketones, ur 20 (*)    All other components within normal limits  CBG MONITORING, ED - Abnormal; Notable for the following components:   Glucose-Capillary 364 (*)    All other components within normal limits  CULTURE, BLOOD (ROUTINE X 2)  CULTURE, BLOOD (ROUTINE X 2)  I-STAT CG4 LACTIC ACID, ED  I-STAT CG4 LACTIC ACID, ED    EKG None  Radiology Dg Foot Complete Right  Result Date: 09/16/2017 CLINICAL DATA:  Nonhealing diabetic foot wound.  Infection. EXAM: RIGHT FOOT COMPLETE - 3+ VIEW COMPARISON:  None. FINDINGS: Ill-defined air in the soft tissues lateral to the fifth metatarsal phalangeal joint. Slight decreased density of the adjacent fifth metatarsal head. No frank bony destructive change. Truncation of the third digit distal phalanx with possible osseous fragment distally. Accessory ossicle versus sequela of remote prior injury but the medial cuneiform-metatarsal articulation. Diffuse dorsal soft tissue edema. No radiopaque  foreign body. IMPRESSION: 1. Air in the soft tissues lateral to the fifth metatarsal phalangeal joint likely site of infection/wound. Slight decreased density of the subjacent fifth metatarsal head suggests early osteomyelitis. 2. Abnormal appearance of the third digit distal phalanx with truncation, is likely chronic and may be postsurgical, recommend correlation with surgical history. Electronically Signed   By: Jeb Levering M.D.   On: 09/16/2017 03:45    Procedures Procedures (including critical care time)  Medications Ordered in ED Medications - No data to display   Initial Impression / Assessment and Plan / ED Course  I have reviewed the triage vital signs and the nursing notes.  Pertinent labs & imaging results that were available during my care of the patient were reviewed by me and considered in my medical decision making (see chart for details).  37 y.o. M here with wounds of the foot for the past week.  On exam he has fairly extensive wound to the sole of right lateral foot with what appears to be pus throughout his fifth toe.  Also has some developing erythema of the long the dorsal foot.  I suspect these wounds have been there for quite some time.  States he does not really feel a lot of pain when walking, suspect he has some degree of neuropathy related to his diabetes.  Labs sent, elevated white blood cell count.  X-ray with some bony erosions consistent with early osteomyelitis.  Blood cultures have been sent.  He was started on broad-spectrum vancomycin and Zosyn.  He will be admitted for further management including wound care.  Discussed with Dr. Hal Hope-- he will evaluate in the ED and admit.  Final Clinical Impressions(s) / ED Diagnoses   Final diagnoses:  Other acute osteomyelitis of right foot [facility])    ED Discharge Orders    None       Larene Pickett, PA-C 09/16/17 0444    Orpah Greek, MD 09/16/17 [phone]

## 2017-09-16 NOTE — Consult Note (Addendum)
WOC consult requested for right foot wound.  Bedside nurse applied moist gauze dressing this am, according to the progress notes. X-ray indicates air and possible osteomyelitis to foot wound.  This complex medical condition is beyond the scope of practice for WOC nursing.  Please refer to ortho service for further plan of care.  Discussed with primary team via phone call. Please re-consult if further assistance is needed.  Thank-you,  Cammie Mcgeeawn Adrianne Shackleton MSN, RN, CWOCN, [place]CN-AP, CNS [phone]

## 2017-09-16 NOTE — Progress Notes (Signed)
Patient seen and examined at his baseline.  He has no new complaints.  [facility] by Dr. Lajoyce Cornersuda on Wednesday, 09/18/2017.  Please refer to H&P dictated by Dr. Toniann FailKakrakandy on 09/16/2017 for further details of the assessment and plan.

## 2017-09-16 NOTE — Progress Notes (Signed)
Patient admitted with a diabetic foot ulcer to right 5th metatarsal head draining foul smelling serosanguinous fluid. Undermining noted at 11 O'clock measuring approximately 1 cm. Base of wound with soft yellow and black slough. Has 2+ pitting edema to right leg and some redness noted to dorsal aspect of right foot. Wound was cleanse with normal saline and moist 2x2 gauze, dry 4x4 and kerlix applied. Right leg was elevated on two pillows.

## 2017-09-16 NOTE — H&P (Signed)
History and Physical    Ronnie Shaw IHW:[id number] DOB: [birth year] DOA: 09/16/2017  PCP: Ronnie Shaw, No Pcp Per  Ronnie Shaw coming from: Home.  Chief Complaint: Right foot pain and discharge.  HPI: Ronnie Shaw is a 37 y.o. male with history of diabetes mellitus type 2, obesity, sarcoidosis who has been noncompliant with his insulin regimen presents to the ER because of worsening right foot pain.  Ronnie Shaw states over the last 2 to 3 weeks he noticed ulceration on the plantar aspect of his right foot around the fifth metatarsal head which slowly worsened with increasing swelling and discharge and pain.  Has been having subjective feeling of fever and chills.  Ronnie Shaw states he takes his insulin only when he can afford it.  Last time he took it was around 1 week ago.  ED Course: In the ER Ronnie Shaw blood sugar is around 350.  On exam Ronnie Shaw has an ulceration on the plantar aspect of his right foot with mild discharge and the right foot is swollen.  No definite signs of any ischemia.  Able to move his right foot.  X-ray shows possibility of early osteomyelitis.  Review of Systems: As per HPI, rest all negative.   Past Medical History:  Diagnosis Date  . Cataract   . Diabetes mellitus   . Sarcoidosis     Past Surgical History:  Procedure Laterality Date  . CATARACT EXTRACTION W/ INTRAOCULAR LENS  IMPLANT, BILATERAL       reports that he has quit smoking. He smoked 0.00 packs per day. He has never used smokeless tobacco. He reports that he drinks alcohol. He reports that he does not use drugs.  Allergies  Allergen Reactions  . Prednisone     "makes me go blind", "that's how I got cataracts".    History reviewed. No pertinent family history.  Prior to Admission medications   Medication Sig Start Date End Date Taking? Authorizing Provider  insulin aspart protamine- aspart (NOVOLOG MIX 70/30) (70-30) 100 UNIT/ML injection Inject 0.5 mLs (50 Units total) into the skin 2 (two) times  daily with a meal. 08/17/15  Yes Waynetta Pean, PA-C  Blood Glucose Monitoring Suppl (TRUE METRIX METER) w/Device KIT 1 Device by Does not apply route once. 08/17/15   Waynetta Pean, PA-C    Physical Exam: Vitals:   09/16/17 0217 09/16/17 0228 09/16/17 0504  BP: 130/86  112/63  Pulse: (!) 106  99  Resp: 20  18  Temp: 99.9 F (37.7 C)  99.8 F (37.7 C)  TempSrc:   Oral  SpO2: 99%  99%  Weight:  127 kg (280 lb) 127 kg (280 lb)  Height:  '6\' 1"'  (1.854 m) 6' (1.829 m)      Constitutional: Moderately built and nourished. Vitals:   09/16/17 0217 09/16/17 0228 09/16/17 0504  BP: 130/86  112/63  Pulse: (!) 106  99  Resp: 20  18  Temp: 99.9 F (37.7 C)  99.8 F (37.7 C)  TempSrc:   Oral  SpO2: 99%  99%  Weight:  127 kg (280 lb) 127 kg (280 lb)  Height:  '6\' 1"'  (1.854 m) 6' (1.829 m)   Eyes: Anicteric no pallor. ENMT: No discharge from the ears eyes nose or mouth. Neck: No mass felt.  No neck rigidity. Respiratory: No rhonchi or crepitations. Cardiovascular: S1-S2 heard no murmurs appreciated. Abdomen: Soft nontender bowel sounds present. Musculoskeletal: Swelling of the right foot with ulceration on the plantar aspect. Skin: Plantar aspect of right foot ulcer. Neurologic:  Alert awake oriented to time place and person.  Moves all extremities. Psychiatric: Appears normal.  Normal affect.   Labs on Admission: I have personally reviewed following labs and imaging studies  CBC: Recent Labs  Lab 09/16/17 0326  WBC 14.6*  NEUTROABS 11.1*  HGB 12.4*  HCT 36.0*  MCV 85.1  PLT 277   Basic Metabolic Panel: Recent Labs  Lab 09/16/17 0326  NA 127*  K 4.3  CL 90*  CO2 23  GLUCOSE 391*  BUN 12  CREATININE 0.96  CALCIUM 9.0   GFR: Estimated Creatinine Clearance: 148 mL/min (by C-G formula based on SCr of 0.96 mg/dL). Liver Function Tests: No results for input(s): AST, ALT, ALKPHOS, BILITOT, PROT, ALBUMIN in the last 168 hours. No results for input(s): LIPASE, AMYLASE  in the last 168 hours. No results for input(s): AMMONIA in the last 168 hours. Coagulation Profile: No results for input(s): INR, PROTIME in the last 168 hours. Cardiac Enzymes: No results for input(s): CKTOTAL, CKMB, CKMBINDEX, TROPONINI in the last 168 hours. BNP (last 3 results) No results for input(s): PROBNP in the last 8760 hours. HbA1C: No results for input(s): HGBA1C in the last 72 hours. CBG: Recent Labs  Lab 09/16/17 0223  GLUCAP 364*   Lipid Profile: No results for input(s): CHOL, HDL, LDLCALC, TRIG, CHOLHDL, LDLDIRECT in the last 72 hours. Thyroid Function Tests: No results for input(s): TSH, T4TOTAL, FREET4, T3FREE, THYROIDAB in the last 72 hours. Anemia Panel: No results for input(s): VITAMINB12, FOLATE, FERRITIN, TIBC, IRON, RETICCTPCT in the last 72 hours. Urine analysis:    Component Value Date/Time   COLORURINE YELLOW 09/16/2017 0249   APPEARANCEUR CLEAR 09/16/2017 0249   LABSPEC 1.031 (H) 09/16/2017 0249   PHURINE 6.0 09/16/2017 0249   GLUCOSEU >=500 (A) 09/16/2017 0249   HGBUR MODERATE (A) 09/16/2017 0249   BILIRUBINUR NEGATIVE 09/16/2017 0249   KETONESUR 20 (A) 09/16/2017 0249   PROTEINUR NEGATIVE 09/16/2017 0249   UROBILINOGEN 1.0 12/18/2014 0533   NITRITE NEGATIVE 09/16/2017 0249   LEUKOCYTESUR NEGATIVE 09/16/2017 0249   Sepsis Labs: '@LABRCNTIP' (procalcitonin:4,lacticidven:4) )No results found for this or any previous visit (from the past 240 hour(s)).   Radiological Exams on Admission: Dg Foot Complete Right  Result Date: 09/16/2017 CLINICAL DATA:  Nonhealing diabetic foot wound.  Infection. EXAM: RIGHT FOOT COMPLETE - 3+ VIEW COMPARISON:  None. FINDINGS: Ill-defined air in the soft tissues lateral to the fifth metatarsal phalangeal joint. Slight decreased density of the adjacent fifth metatarsal head. No frank bony destructive change. Truncation of the third digit distal phalanx with possible osseous fragment distally. Accessory ossicle versus  sequela of remote prior injury but the medial cuneiform-metatarsal articulation. Diffuse dorsal soft tissue edema. No radiopaque foreign body. IMPRESSION: 1. Air in the soft tissues lateral to the fifth metatarsal phalangeal joint likely site of infection/wound. Slight decreased density of the subjacent fifth metatarsal head suggests early osteomyelitis. 2. Abnormal appearance of the third digit distal phalanx with truncation, is likely chronic and may be postsurgical, recommend correlation with surgical history. Electronically Signed   By: Jeb Levering M.D.   On: 09/16/2017 03:45   Osteomyelitis of the right foot with  Assessment/Plan Principal Problem:   Osteomyelitis of right foot ([place]) Active Problems:   Diabetes mellitus type 2 in obese (HCC)   Normochromic normocytic anemia   Hyponatremia   Osteomyelitis ([place])    1. Osteomyelitis of the right foot with diabetic foot ulcer and cellulitis -Ronnie Shaw is placed on empiric antibiotics follow cultures sed rate.  I have ordered MRI of the right foot.  Based on which we will have further recommendations.  Will check Dopplers. 2. Diabetes mellitus type 2 uncontrolled secondary to noncompliance with medication -Ronnie Shaw states he usually takes NovoLog 70/30 50 units twice daily.  I have placed Ronnie Shaw on 40 units twice daily with sliding scale coverage.  Closely follow CBGs. 3. Normocytic normochromic anemia -check anemia panel and follow CBC. 4. Hematuria -eventually will need repeat UA and possible urology referral. 5. Hyponatremia likely from hyperglycemia -I think once Ronnie Shaw's glucose is corrected Ronnie Shaw's sodium will get corrected.   DVT prophylaxis: SCDs in anticipation of procedure. Code Status: Full code. Family Communication: Discussed with Ronnie Shaw. Disposition Plan: Home. Consults called: Wound team. Admission status: Inpatient.   Rise Patience MD [facility] Pager [phone].  If 7PM-7AM, please contact  night-coverage [email or web address] Password [facility]  09/16/2017, 5:56 AM

## 2017-09-16 NOTE — Progress Notes (Signed)
[facility]. Stable, Alert and Oriented x4. Update from [facility] no changes. Zosyn IV running.

## 2017-09-16 NOTE — ED Notes (Signed)
Called floor x 3 and no answer. Called charge nurse and no answer.

## 2017-09-16 NOTE — Consult Note (Signed)
Reason for Consult:Foot ulcer Referring Physician: C Melvin Whiteford is an 37 y.o. male with uncontrolled DM. HPI: Syed notes an approximately 3 weeks hx/o an ulcer on his right foot. He had been treating it at home with H2O2 and alcohol but it kept getting worse. He c/o pain in that area as well. He denies prior issues with foot ulcers though has had nail infections before. He denies fevers, chills, sweats, N/V.  Past Medical History:  Diagnosis Date  . Cataract   . Diabetes mellitus   . Sarcoidosis     Past Surgical History:  Procedure Laterality Date  . CATARACT EXTRACTION W/ INTRAOCULAR LENS  IMPLANT, BILATERAL      History reviewed. No pertinent family history.  Social History:  reports that he has quit smoking. He smoked 0.00 packs per day. He has never used smokeless tobacco. He reports that he drinks alcohol. He reports that he does not use drugs.  Allergies:  Allergies  Allergen Reactions  . Prednisone     "makes me go blind", "that's how I got cataracts".    Medications: I have reviewed the patient's current medications.  Results for orders placed or performed during the hospital encounter of 09/16/17 (from the past 48 hour(s))  CBG monitoring, ED     Status: Abnormal   Collection Time: 09/16/17  2:23 AM  Result Value Ref Range   Glucose-Capillary 364 (H) 65 - 99 mg/dL   Comment 1 Notify RN   Urinalysis, Routine w reflex microscopic     Status: Abnormal   Collection Time: 09/16/17  2:49 AM  Result Value Ref Range   Color, Urine YELLOW YELLOW   APPearance CLEAR CLEAR   Specific Gravity, Urine 1.031 (H) 1.005 - 1.030   pH 6.0 5.0 - 8.0   Glucose, UA >=500 (A) NEGATIVE mg/dL   Hgb urine dipstick MODERATE (A) NEGATIVE   Bilirubin Urine NEGATIVE NEGATIVE   Ketones, ur 20 (A) NEGATIVE mg/dL   Protein, ur NEGATIVE NEGATIVE mg/dL   Nitrite NEGATIVE NEGATIVE   Leukocytes, UA NEGATIVE NEGATIVE   RBC / HPF 11-20 0 - 5 RBC/hpf   WBC, UA 6-10 0 - 5 WBC/hpf    Bacteria, UA NONE SEEN NONE SEEN   Squamous Epithelial / LPF 0-5 0 - 5    Comment: Performed at [facility], [place] [address] [place], [place] [address]  CBC with Differential     Status: Abnormal   Collection Time: 09/16/17  3:26 AM  Result Value Ref Range   WBC 14.6 (H) 4.0 - 10.5 K/uL   RBC 4.23 4.22 - 5.81 MIL/uL   Hemoglobin 12.4 (L) 13.0 - 17.0 g/dL   HCT 36.0 (L) 39.0 - 52.0 %   MCV 85.1 78.0 - 100.0 fL   MCH 29.3 26.0 - 34.0 pg   MCHC 34.4 30.0 - 36.0 g/dL   RDW 12.3 11.5 - 15.5 %   Platelets 334 150 - 400 K/uL   Neutrophils Relative % 76 %   Lymphocytes Relative 15 %   Monocytes Relative 9 %   Eosinophils Relative 0 %   Basophils Relative 0 %   Neutro Abs 11.1 (H) 1.7 - 7.7 K/uL   Lymphs Abs 2.2 0.7 - 4.0 K/uL   Monocytes Absolute 1.3 (H) 0.1 - 1.0 K/uL   Eosinophils Absolute 0.0 0.0 - 0.7 K/uL   Basophils Absolute 0.0 0.0 - 0.1 K/uL   Smear Review MORPHOLOGY UNREMARKABLE     Comment: Performed at [facility]  [place] [address] [place], [place] [address]  Basic metabolic panel     Status: Abnormal   Collection Time: 09/16/17  3:26 AM  Result Value Ref Range   Sodium 127 (L) 135 - 145 mmol/L   Potassium 4.3 3.5 - 5.1 mmol/L   Chloride 90 (L) 101 - 111 mmol/L   CO2 23 22 - 32 mmol/L   Glucose, Bld 391 (H) 65 - 99 mg/dL   BUN 12 6 - 20 mg/dL   Creatinine, Ser 0.96 0.61 - 1.24 mg/dL   Calcium 9.0 8.9 - 10.3 mg/dL   GFR calc non Af Amer >60 >60 mL/min   GFR calc Af Amer >60 >60 mL/min    Comment: (NOTE) The eGFR has been calculated using the CKD EPI equation. This calculation has not been validated in all clinical situations. eGFR's persistently <60 mL/min signify possible Chronic Kidney Disease.    Anion gap 14 5 - 15    Comment: Performed at [facility], [place] [address] [place], [place] [address]  Hemoglobin A1c     Status: Abnormal   Collection Time: 09/16/17  3:26 AM  Result Value Ref Range   Hgb  A1c MFr Bld 13.0 (H) 4.8 - 5.6 %    Comment: (NOTE) Pre diabetes:          5.7%-6.4% Diabetes:              >6.4% Glycemic control for   <7.0% adults with diabetes    Mean Plasma Glucose 326.4 mg/dL    Comment: Performed at [place] [address], [place], [place] [address]  I-Stat CG4 Lactic Acid, ED     Status: None   Collection Time: 09/16/17  3:37 AM  Result Value Ref Range   Lactic Acid, Venous 1.25 0.5 - 1.9 mmol/L  Vitamin B12     Status: None   Collection Time: 09/16/17  5:57 AM  Result Value Ref Range   Vitamin B-12 371 180 - 914 pg/mL    Comment: (NOTE) This assay is not validated for testing neonatal or myeloproliferative syndrome specimens for Vitamin B12 levels. Performed at [facility], [place] [address], [place], [place] [address]   Folate     Status: None   Collection Time: 09/16/17  5:57 AM  Result Value Ref Range   Folate 11.7 >5.9 ng/mL    Comment: Performed at [facility], [place] [address] [place], [place] [address]  Iron and TIBC     Status: Abnormal   Collection Time: 09/16/17  5:57 AM  Result Value Ref Range   Iron 23 (L) 45 - 182 ug/dL   TIBC 237 (L) 250 - 450 ug/dL   Saturation Ratios 10 (L) 17.9 - 39.5 %   UIBC 214 ug/dL    Comment: Performed at [facility], [place] [address] [place], [place] [address]  Ferritin     Status: Abnormal   Collection Time: 09/16/17  5:57 AM  Result Value Ref Range   Ferritin 693 (H) 24 - 336 ng/mL    Comment: Performed at [facility], [place] [address] [place], [place] [address]  Sedimentation rate     Status: Abnormal   Collection Time: 09/16/17  7:02 AM  Result Value Ref Range   Sed Rate 102 (H) 0 - 16 mm/hr    Comment: Performed at [facility], [place] [address] [place], [place] [address]  Reticulocytes  Status: None   Collection Time: 09/16/17  7:02 AM  Result Value Ref Range   Retic Ct Pct 1.2 0.4 - 3.1 %    RBC. 4.27 4.22 - 5.81 MIL/uL   Retic Count, Absolute 51.2 19.0 - 186.0 K/uL    Comment: Performed at [facility], [place] [address], [place], [place] [address]  Glucose, capillary     Status: Abnormal   Collection Time: 09/16/17  7:31 AM  Result Value Ref Range   Glucose-Capillary 353 (H) 65 - 99 mg/dL  Glucose, capillary     Status: Abnormal   Collection Time: 09/16/17  9:36 AM  Result Value Ref Range   Glucose-Capillary 332 (H) 65 - 99 mg/dL    Mr Foot Right Wo Contrast  Result Date: 09/16/2017 CLINICAL DATA:  Soft tissue ulceration of the right forefoot at the fifth MTP joint. Swelling. EXAM: MRI OF THE RIGHT FOREFOOT WITHOUT CONTRAST TECHNIQUE: Multiplanar, multisequence MR imaging of the right forefoot was performed. No intravenous contrast was administered. COMPARISON:  Radiographs dated 09/16/2017 FINDINGS: Bones/Joint/Cartilage There is abnormal edema in the head of the fifth metatarsal and the base of the proximal phalanx of the little toe with adjacent soft tissue edema and soft tissue ulceration at the dorsal lateral aspect of the joint. Physiologic amount of joint fluid. No discrete cortical destruction. The other bones of the forefoot are normal. Muscles and Tendons Normal. Soft tissues Soft tissue edema around the fifth MTP joint consistent with cellulitis. Focal 15 mm soft tissue ulceration at the plantar lateral aspect of fifth MTP joint. IMPRESSION: Osteomyelitis of the head of the fifth metatarsal and the base of the proximal phalanx of the little toe with adjacent cellulitis. Electronically Signed   By: Lorriane Shire M.D.   On: 09/16/2017 09:08   Dg Foot Complete Right  Result Date: 09/16/2017 CLINICAL DATA:  Nonhealing diabetic foot wound.  Infection. EXAM: RIGHT FOOT COMPLETE - 3+ VIEW COMPARISON:  None. FINDINGS: Ill-defined air in the soft tissues lateral to the fifth metatarsal phalangeal joint. Slight decreased density of the adjacent fifth metatarsal  head. No frank bony destructive change. Truncation of the third digit distal phalanx with possible osseous fragment distally. Accessory ossicle versus sequela of remote prior injury but the medial cuneiform-metatarsal articulation. Diffuse dorsal soft tissue edema. No radiopaque foreign body. IMPRESSION: 1. Air in the soft tissues lateral to the fifth metatarsal phalangeal joint likely site of infection/wound. Slight decreased density of the subjacent fifth metatarsal head suggests early osteomyelitis. 2. Abnormal appearance of the third digit distal phalanx with truncation, is likely chronic and may be postsurgical, recommend correlation with surgical history. Electronically Signed   By: Jeb Levering M.D.   On: 09/16/2017 03:45    Review of Systems  Constitutional: Negative for weight loss.  HENT: Negative for ear discharge, ear pain, hearing loss and tinnitus.   Eyes: Negative for blurred vision, double vision, photophobia and pain.  Respiratory: Negative for cough, sputum production and shortness of breath.   Cardiovascular: Negative for chest pain.  Gastrointestinal: Negative for abdominal pain, nausea and vomiting.  Genitourinary: Negative for dysuria, flank pain, frequency and urgency.  Musculoskeletal: Positive for joint pain (Right foot). Negative for back pain, falls, myalgias and neck pain.  Neurological: Negative for dizziness, tingling, sensory change, focal weakness, loss of consciousness and headaches.  Endo/Heme/Allergies: Does not bruise/bleed easily.  Psychiatric/Behavioral: Negative for depression, memory loss and substance abuse. The patient is not nervous/anxious.    Blood pressure (!) (P) 150/91,  pulse (P) 100, temperature (P) 99.4 F (37.4 C), temperature source (P) Oral, resp. rate 18, height 6' (1.829 m), weight 127 kg (280 lb), SpO2 (P) 100 %. Physical Exam  Constitutional: He appears well-developed and well-nourished. No distress.  HENT:  Head: Normocephalic and  atraumatic.  Eyes: Conjunctivae are normal. Right eye exhibits no discharge. Left eye exhibits no discharge. No scleral icterus.  Neck: Normal range of motion.  Cardiovascular: Normal rate and regular rhythm.  Respiratory: Effort normal. No respiratory distress.  Musculoskeletal:  RLE No traumatic wounds, ecchymosis, or rash  Ulceration plantar foot over 5th MTP joint, no expressible purulence but malodorous, subq fluid noted  No knee or ankle effusion  Knee stable to varus/ valgus and anterior/posterior stress  Sens DPN, SPN, TN intact  Motor EHL, ext, flex, evers 5/5  DP 2+, PT 2+, 2+ edema  LLE No traumatic wounds, ecchymosis, or rash  Nontender  No knee or ankle effusion  Knee stable to varus/ valgus and anterior/posterior stress  Sens DPN, SPN, TN intact  Motor EHL, ext, flex, evers 5/5  DP 2+, PT 2+, No significant edema, calluses plantar and lateral  Neurological: He is alert.  Skin: Skin is warm and dry. He is not diaphoretic.  Psychiatric: He has a normal mood and affect. His behavior is normal.    Assessment/Plan: Right foot diabetic ulcer with underlying osteo of 5th prox phalanx and MT -- Request transfer to [facility] for evaluation by Dr. Sharol Given and likely ray amputation on Wednesday. DM, sarcoidoisis    Ronnie Abu, PA-C Orthopedic Surgery [phone] 09/16/2017, 10:25 AM

## 2017-09-16 NOTE — ED Notes (Signed)
Tried to get Piv x 2

## 2017-09-16 NOTE — Progress Notes (Signed)
Pharmacy Antibiotic Note  Ronnie Shaw is a 37 y.o. male with right foot ulceration presented to the ED on 09/16/2017 with c/o right foot pain and discharge. To start vancomycin and zosyn for foot osteomyelitis/cellulitis.  - 6/3: R foot MRI: Osteomyelitis of the head of the fifth metatarsal and the base of the proximal phalanx of the little toe with adjacent cellulitis.  - afeb, wbc 14.6, scr 0.96 (crcl~100)   Plan: - vancomycin 2500 mg IV x1 given in the ED, then 1500 mg IV q12h for est AUC 462 - zosyn 3.375 gm IV q8h (infuse over 4 hrs) - scr daily - ortho recom to [facility] on 6/5 ______________________________  Height: 6' (182.9 cm) Weight: 280 lb (127 kg) IBW/kg (Calculated) : 77.6  Temp (24hrs), Avg:99.7 F (37.6 C), Min:99.4 F (37.4 C), Max:99.9 F (37.7 C)  Recent Labs  Lab 09/16/17 0326 09/16/17 0337  WBC 14.6*  --   CREATININE 0.96  --   LATICACIDVEN  --  1.25    Estimated Creatinine Clearance: 148 mL/min (by C-G formula based on SCr of 0.96 mg/dL).    Allergies  Allergen Reactions  . Prednisone     "makes me go blind", "that's how I got cataracts".     Thank you for allowing pharmacy to be a part of this patient's care.  Lucia Gaskinsham, Ravis Herne P 09/16/2017 11:30 AM

## 2017-09-16 NOTE — Progress Notes (Signed)
70/30 NovoLOG was held because pt did not eat his dinner.  He felt sick to his stomach and was unable to eat.  That information was given to ArdentownBobby, RN at report. Clide Dalesanielle M. RN, 09/16/17 @ [phone]

## 2017-09-16 NOTE — Progress Notes (Signed)
Pt appears to be doing better. He is less tearful and more comfortable speaking about what is currently going on with his situation.  Pt was encouraged to order food and eat as he has not eaten since breakfast.  Pt family at bedside.  Clide Dalesanielle M, RN, 09/16/17 @ [phone]

## 2017-09-16 NOTE — Progress Notes (Signed)
Received patient after receiving report from Sharene SkeansJeneen Nash, RN.  Arrived via wheelchair.  Skin Warm and Dry.  Oriented to room.  Wound on foot dressed.  No complaints voiced.

## 2017-09-16 NOTE — Progress Notes (Addendum)
Spoke with patient after he was informed that they may need to amputate right little toe.  Pt was very tearful and explained that he was depressed. I asked him if he had thoughts of harming himself or anyone else, and he stated that he did not. I asked him again if he was suicidal and he said that he was not. Pt is tearful and states he is depressed that he let it get so bad and he does not want to lose his toe. Will monitor patient closely and continue to assess risk for self harm.  Pt was also asked if he wanted us to call anyone for him or he wanted to speak with a spiritual counselor, pt declined both. Will CTM Jackelyn Knifeanielle Sylvain Hasten, RN, 09/16/2017 @ 1144

## 2017-09-16 NOTE — ED Triage Notes (Signed)
Pt stated "I don't check my sugar.  [facility].  I don't have a PCP.  I take 70/30 insulin.  I've been feeling bad x 1 week."  Pt presents with wound(s) to right foot.

## 2017-09-16 NOTE — ED Notes (Signed)
Urine culture sent down to the lab as well. 

## 2017-09-16 NOTE — Progress Notes (Signed)
Report called to ForsythBen, RN on 6N at [facility]. [facility] called and they stated it would be after 1900 for transport.  Duwayne Heckanielle, RN

## 2017-09-17 ENCOUNTER — Other Ambulatory Visit (INDEPENDENT_AMBULATORY_CARE_PROVIDER_SITE_OTHER): Payer: Self-pay | Admitting: Orthopedic Surgery

## 2017-09-17 DIAGNOSIS — M86271 Subacute osteomyelitis, right ankle and foot: Secondary | ICD-10-CM

## 2017-09-17 DIAGNOSIS — E1142 Type 2 diabetes mellitus with diabetic polyneuropathy: Secondary | ICD-10-CM

## 2017-09-17 DIAGNOSIS — E1169 Type 2 diabetes mellitus with other specified complication: Principal | ICD-10-CM

## 2017-09-17 DIAGNOSIS — E669 Obesity, unspecified: Secondary | ICD-10-CM

## 2017-09-17 LAB — GLUCOSE, CAPILLARY
GLUCOSE-CAPILLARY: 150 mg/dL — AB (ref 65–99)
Glucose-Capillary: 252 mg/dL — ABNORMAL HIGH (ref 65–99)
Glucose-Capillary: 178 mg/dL — ABNORMAL HIGH (ref 65–99)
Glucose-Capillary: 191 mg/dL — ABNORMAL HIGH (ref 65–99)

## 2017-09-17 MED ORDER — HYDRALAZINE HCL 20 MG/ML IJ SOLN
5.0000 mg | Freq: Four times a day (QID) | INTRAMUSCULAR | Status: DC | PRN
  Administered 2017-09-19: 5 mg via INTRAVENOUS
  Filled 2017-09-17: qty 1

## 2017-09-17 MED ORDER — BISACODYL 5 MG PO TBEC
10.0000 mg | DELAYED_RELEASE_TABLET | Freq: Once | ORAL | Status: AC
Start: 2017-09-17 — End: 2017-09-17
  Administered 2017-09-17: 10 mg via ORAL
  Filled 2017-09-17: qty 2

## 2017-09-17 MED ORDER — LIVING WELL WITH DIABETES BOOK
Freq: Once | Status: AC
  Administered 2017-09-17: 15:00:00
  Filled 2017-09-17: qty 1

## 2017-09-17 MED ORDER — LACTULOSE 10 GM/15ML PO SOLN
20.0000 g | Freq: Once | ORAL | Status: AC
  Administered 2017-09-17: 20 g via ORAL
  Filled 2017-09-17: qty 30

## 2017-09-17 NOTE — Consult Note (Signed)
ORTHOPAEDIC CONSULTATION  REQUESTING PHYSICIAN: Georgette Shell, MD  Chief Complaint: Purulent abscess with osteomyelitis right foot fifth metatarsal head.  HPI: Ronnie Shaw is a 37 y.o. male who presents with osteomyelitis and purulent draining abscess right foot fifth metatarsal head.  Patient has uncontrolled type 2 diabetes with a hemoglobin A1c of 13.  Past Medical History:  Diagnosis Date  . Cataract   . Diabetes mellitus   . Sarcoidosis    Past Surgical History:  Procedure Laterality Date  . CATARACT EXTRACTION W/ INTRAOCULAR LENS  IMPLANT, BILATERAL     Social History   Socioeconomic History  . Marital status: Single    Spouse name: Not on file  . Number of children: Not on file  . Years of education: Not on file  . Highest education level: Not on file  Occupational History  . Not on file  Social Needs  . Financial resource strain: Not on file  . Food insecurity:    Worry: Not on file    Inability: Not on file  . Transportation needs:    Medical: Not on file    Non-medical: Not on file  Tobacco Use  . Smoking status: Former Smoker    Packs/day: 0.00  . Smokeless tobacco: Never Used  Substance and Sexual Activity  . Alcohol use: Yes    Comment: occasional  . Drug use: No  . Sexual activity: Not on file  Lifestyle  . Physical activity:    Days per week: Not on file    Minutes per session: Not on file  . Stress: Not on file  Relationships  . Social connections:    Talks on phone: Not on file    Gets together: Not on file    Attends religious service: Not on file    Active member of club or organization: Not on file    Attends meetings of clubs or organizations: Not on file    Relationship status: Not on file  Other Topics Concern  . Not on file  Social History Narrative  . Not on file   History reviewed. No pertinent family history. - negative except otherwise stated in the family history section Allergies  Allergen Reactions  .  Prednisone     "makes me go blind", "that's how I got cataracts".   Prior to Admission medications   Medication Sig Start Date End Date Taking? Authorizing Provider  insulin aspart protamine- aspart (NOVOLOG MIX 70/30) (70-30) 100 UNIT/ML injection Inject 0.5 mLs (50 Units total) into the skin 2 (two) times daily with a meal. 08/17/15  Yes Waynetta Pean, PA-C  Blood Glucose Monitoring Suppl (TRUE METRIX METER) w/Device KIT 1 Device by Does not apply route once. 08/17/15   Waynetta Pean, PA-C   Mr Foot Right Wo Contrast  Result Date: 09/16/2017 CLINICAL DATA:  Soft tissue ulceration of the right forefoot at the fifth MTP joint. Swelling. EXAM: MRI OF THE RIGHT FOREFOOT WITHOUT CONTRAST TECHNIQUE: Multiplanar, multisequence MR imaging of the right forefoot was performed. No intravenous contrast was administered. COMPARISON:  Radiographs dated 09/16/2017 FINDINGS: Bones/Joint/Cartilage There is abnormal edema in the head of the fifth metatarsal and the base of the proximal phalanx of the little toe with adjacent soft tissue edema and soft tissue ulceration at the dorsal lateral aspect of the joint. Physiologic amount of joint fluid. No discrete cortical destruction. The other bones of the forefoot are normal. Muscles and Tendons Normal. Soft tissues Soft tissue edema around the fifth MTP joint  consistent with cellulitis. Focal 15 mm soft tissue ulceration at the plantar lateral aspect of fifth MTP joint. IMPRESSION: Osteomyelitis of the head of the fifth metatarsal and the base of the proximal phalanx of the little toe with adjacent cellulitis. Electronically Signed   By: Lorriane Shire M.D.   On: 09/16/2017 09:08   Dg Foot Complete Right  Result Date: 09/16/2017 CLINICAL DATA:  Nonhealing diabetic foot wound.  Infection. EXAM: RIGHT FOOT COMPLETE - 3+ VIEW COMPARISON:  None. FINDINGS: Ill-defined air in the soft tissues lateral to the fifth metatarsal phalangeal joint. Slight decreased density of the  adjacent fifth metatarsal head. No frank bony destructive change. Truncation of the third digit distal phalanx with possible osseous fragment distally. Accessory ossicle versus sequela of remote prior injury but the medial cuneiform-metatarsal articulation. Diffuse dorsal soft tissue edema. No radiopaque foreign body. IMPRESSION: 1. Air in the soft tissues lateral to the fifth metatarsal phalangeal joint likely site of infection/wound. Slight decreased density of the subjacent fifth metatarsal head suggests early osteomyelitis. 2. Abnormal appearance of the third digit distal phalanx with truncation, is likely chronic and may be postsurgical, recommend correlation with surgical history. Electronically Signed   By: Jeb Levering M.D.   On: 09/16/2017 03:45   - pertinent xrays, CT, MRI studies were reviewed and independently interpreted  Positive ROS: All other systems have been reviewed and were otherwise negative with the exception of those mentioned in the HPI and as above.  Physical Exam: General: Alert, no acute distress Psychiatric: Patient is competent for consent with normal mood and affect Lymphatic: No axillary or cervical lymphadenopathy Cardiovascular: No pedal edema Respiratory: No cyanosis, no use of accessory musculature GI: No organomegaly, abdomen is soft and non-tender    Images:  _0 @  Labs:  Lab Results  Component Value Date   HGBA1C 13.0 (H) 09/16/2017   ESRSEDRATE 102 (H) 09/16/2017    Lab Results  Component Value Date   ALBUMIN 3.8 12/18/2014   ALBUMIN 3.7 09/07/2010    Neurologic: Patient does not have protective sensation bilateral lower extremities.   MUSCULOSKELETAL:   Skin: Examination patient has blistering dorsally over the MTP joint.  There is a purulent draining ulcer beneath the fifth metatarsal head.  Patient has a strong dorsalis pedis pulse.  MRI scan radiographs shows chronic destructive bony changes of the fifth metatarsal head  right foot consistent with chronic osteomyelitis.  Assessment: Assessment: Uncontrolled type 2 diabetes with osteomyelitis ulceration abscess right foot fifth metatarsal head.  Plan: Plan: We will plan for fifth ray amputation.  Discussed that if the infection extends further than the fifth ray we may have to resect additional tissue.  Risks and benefits were discussed including persistent infection nonhealing the wound need for additional surgery.  Discussed the importance of proper long-term nutrition management with a diet of greens beans nuts and fruit.  Recommended Dr. Fara Olden Fuhrman's book End diabetes.   Plan for right foot fifth ray amputation tomorrow Wednesday.  N.p.o. after midnight.  Thank you for the consult and the opportunity to see Ronnie Shaw, [place] [phone] 6:34 AM

## 2017-09-17 NOTE — Progress Notes (Addendum)
PROGRESS NOTE    Ronnie Shaw  ZOX:[id number] DOB: 12/08/1980 DOA: 09/16/2017 PCP: Patient, No Pcp Per   Brief Narrative:37 y.o. male with history of diabetes mellitus type 2, obesity, sarcoidosis who has been noncompliant with his insulin regimen presents to the ER because of worsening right foot pain.  Patient states over the last 2 to 3 weeks he noticed ulceration on the plantar aspect of his right foot around the fifth metatarsal head which slowly worsened with increasing swelling and discharge and pain.  Has been having subjective feeling of fever and chills.  Patient states he takes his insulin only when he can afford it.  Last time he took it was around 1 week ago.  ED Course: In the ER patient blood sugar is around 350.  On exam patient has an ulceration on the plantar aspect of his right foot with mild discharge and the right foot is swollen.  No definite signs of any ischemia.  Able to move his right foot.  X-ray shows possibility of early osteomyelitis    Assessment & Plan:   Principal Problem:   Osteomyelitis of right foot (HCC) Active Problems:   Diabetes mellitus type 2 in obese (HCC)   Normochromic normocytic anemia   Hyponatremia   Osteomyelitis (HCC)   Osteomyelitis of foot, right, acute (HCC)   Diabetic polyneuropathy associated with type 2 diabetes mellitus (HCC)  1. Osteomyelitis of the right foot with diabetic foot ulcer and cellulitis -patient is on vancomycin and Zosyn.  Pharmacy following.  [facility] be seen by Dr. Lajoyce Cornersuda for amputation on Wednesday tomorrow.  MRI of the foot showed osteomyelitis of the head of the fifth metatarsal and the base of the proximal phalanx of the little toe with adjacent cellulitis.   1. Diabetes mellitus type 2 uncontrolled secondary to noncompliance with medication.  Patient reports he does not take his medication because he cannot afford it.  His hemoglobin A1c was 13.  Increase the dose of  70/30 insulin to 45 units twice a day.  Give half of the insulin dose tomorrow morning prior to surgery.  2. Normocytic normochromic anemia -stable. 3. Hematuria -eventually will need repeat UA and possible urology referral. 4. Hyponatremia likely from hyperglycemia -I think once patient's glucose is corrected patient's sodium will get corrected. 5] hypertension patient denies having a history of hypertension.  I will start him on PRN hydralazine if blood pressure stays high consider discharging him on p.o. agents.       DVT prophylaxis: SCD Code Status: Full code Family Communication:  family member in the room. Disposition Plan: TBD patient came from home. Consultants:  Ortho Procedures: None Antimicrobials: Vancomycin and Zosyn Subjective: Feels kind of nervous.  Not had a bowel movement for 3 days.  Not eating much decreased appetite.  Uncomfortable in the belly  secondary to constipation.  Objective: Vitals:   09/16/17 1323 09/16/17 1418 09/16/17 2109 09/17/17 0452  BP: (!) 161/96 (!) 168/89 (!) 144/95 (!) 142/83  Pulse: 93 98 95 88  Resp: 16  20 18   Temp: 98.5 F (36.9 C)  (!) 100.7 F (38.2 C) 98.8 F (37.1 C)  TempSrc: Oral  Oral Oral  SpO2: 99%  100% 100%  Weight:   129.7 kg (285 lb 15 oz)   Height:   6\' 1"  (1.854 m)     Intake/Output Summary (Last 24 hours) at 09/17/2017 0906 Last data filed at 09/17/2017 0500 Gross per 24 hour  Intake 1620  ml  Output 800 ml  Net 820 ml   Filed Weights   09/16/17 0228 09/16/17 0504 09/16/17 2109  Weight: 127 kg (280 lb) 127 kg (280 lb) 129.7 kg (285 lb 15 oz)    Examination:  General exam: Appears calm and comfortable  Respiratory system: Clear to auscultation. Respiratory effort normal. Cardiovascular system: S1 & S2 heard, RRR. No JVD, murmurs, rubs, gallops or clicks. No pedal edema. Gastrointestinal system: Abdomen is nondistended, soft and nontender. No organomegaly or masses felt. Normal bowel sounds  heard. Central nervous system: Alert and oriented. No focal neurological deficits. Extremities: Right foot covered in dressings. Skin: No rashes, lesions or ulcers Psychiatry: Judgement and insight appear normal. Mood & affect appropriate.     Data Reviewed: I have personally reviewed following labs and imaging studies  CBC: Recent Labs  Lab 09/16/17 0326  WBC 14.6*  NEUTROABS 11.1*  HGB 12.4*  HCT 36.0*  MCV 85.1  PLT 334   Basic Metabolic Panel: Recent Labs  Lab 09/16/17 0326  NA 127*  K 4.3  CL 90*  CO2 23  GLUCOSE 391*  BUN 12  CREATININE 0.96  CALCIUM 9.0   GFR: Estimated Creatinine Clearance: 151.6 mL/min (by C-G formula based on SCr of 0.96 mg/dL). Liver Function Tests: No results for input(s): AST, ALT, ALKPHOS, BILITOT, PROT, ALBUMIN in the last 168 hours. No results for input(s): LIPASE, AMYLASE in the last 168 hours. No results for input(s): AMMONIA in the last 168 hours. Coagulation Profile: No results for input(s): INR, PROTIME in the last 168 hours. Cardiac Enzymes: No results for input(s): CKTOTAL, CKMB, CKMBINDEX, TROPONINI in the last 168 hours. BNP (last 3 results) No results for input(s): PROBNP in the last 8760 hours. HbA1C: Recent Labs    09/16/17 0326  HGBA1C 13.0*   CBG: Recent Labs  Lab 09/16/17 1320 09/16/17 1705 09/16/17 1840 09/16/17 2114 09/17/17 0729  GLUCAP 287* 267* 275* 198* 252*   Lipid Profile: No results for input(s): CHOL, HDL, LDLCALC, TRIG, CHOLHDL, LDLDIRECT in the last 72 hours. Thyroid Function Tests: No results for input(s): TSH, T4TOTAL, FREET4, T3FREE, THYROIDAB in the last 72 hours. Anemia Panel: Recent Labs    09/16/17 0557 09/16/17 0702  VITAMINB12 371  --   FOLATE 11.7  --   FERRITIN 693*  --   TIBC 237*  --   IRON 23*  --   RETICCTPCT  --  1.2   Sepsis Labs: Recent Labs  Lab 09/16/17 0337  LATICACIDVEN 1.25    No results found for this or any previous visit (from the past 240  hour(s)).       Radiology Studies: Mr Foot Right Wo Contrast  Result Date: 09/16/2017 CLINICAL DATA:  Soft tissue ulceration of the right forefoot at the fifth MTP joint. Swelling. EXAM: MRI OF THE RIGHT FOREFOOT WITHOUT CONTRAST TECHNIQUE: Multiplanar, multisequence MR imaging of the right forefoot was performed. No intravenous contrast was administered. COMPARISON:  Radiographs dated 09/16/2017 FINDINGS: Bones/Joint/Cartilage There is abnormal edema in the head of the fifth metatarsal and the base of the proximal phalanx of the little toe with adjacent soft tissue edema and soft tissue ulceration at the dorsal lateral aspect of the joint. Physiologic amount of joint fluid. No discrete cortical destruction. The other bones of the forefoot are normal. Muscles and Tendons Normal. Soft tissues Soft tissue edema around the fifth MTP joint consistent with cellulitis. Focal 15 mm soft tissue ulceration at the plantar lateral aspect of fifth MTP joint. IMPRESSION:  Osteomyelitis of the head of the fifth metatarsal and the base of the proximal phalanx of the little toe with adjacent cellulitis. Electronically Signed   By: Francene Boyers M.D.   On: 09/16/2017 09:08   Dg Foot Complete Right  Result Date: 09/16/2017 CLINICAL DATA:  Nonhealing diabetic foot wound.  Infection. EXAM: RIGHT FOOT COMPLETE - 3+ VIEW COMPARISON:  None. FINDINGS: Ill-defined air in the soft tissues lateral to the fifth metatarsal phalangeal joint. Slight decreased density of the adjacent fifth metatarsal head. No frank bony destructive change. Truncation of the third digit distal phalanx with possible osseous fragment distally. Accessory ossicle versus sequela of remote prior injury but the medial cuneiform-metatarsal articulation. Diffuse dorsal soft tissue edema. No radiopaque foreign body. IMPRESSION: 1. Air in the soft tissues lateral to the fifth metatarsal phalangeal joint likely site of infection/wound. Slight decreased density of  the subjacent fifth metatarsal head suggests early osteomyelitis. 2. Abnormal appearance of the third digit distal phalanx with truncation, is likely chronic and may be postsurgical, recommend correlation with surgical history. Electronically Signed   By: Rubye Oaks M.D.   On: 09/16/2017 03:45        Scheduled Meds: . insulin aspart  0-20 Units Subcutaneous TID WC  . insulin aspart  0-5 Units Subcutaneous QHS  . insulin aspart protamine- aspart  40 Units Subcutaneous BID WC   Continuous Infusions: . piperacillin-tazobactam (ZOSYN)  IV 3.375 g (09/17/17 0541)  . vancomycin Stopped (09/17/17 0744)     LOS: 1 day       Alwyn Ren,  If 7PM-7AM, please contact night-coverage [email or web address] Password [email or web address] 09/17/2017, 9:06 AM

## 2017-09-17 NOTE — H&P (View-Only) (Signed)
  ORTHOPAEDIC CONSULTATION  REQUESTING PHYSICIAN: Mathews, Elizabeth G, MD  Chief Complaint: Purulent abscess with osteomyelitis right foot fifth metatarsal head.  HPI: Ronnie Shaw is a 37 y.o. male who presents with osteomyelitis and purulent draining abscess right foot fifth metatarsal head.  Patient has uncontrolled type 2 diabetes with a hemoglobin A1c of 13.  Past Medical History:  Diagnosis Date  . Cataract   . Diabetes mellitus   . Sarcoidosis    Past Surgical History:  Procedure Laterality Date  . CATARACT EXTRACTION W/ INTRAOCULAR LENS  IMPLANT, BILATERAL     Social History   Socioeconomic History  . Marital status: Single    Spouse name: Not on file  . Number of children: Not on file  . Years of education: Not on file  . Highest education level: Not on file  Occupational History  . Not on file  Social Needs  . Financial resource strain: Not on file  . Food insecurity:    Worry: Not on file    Inability: Not on file  . Transportation needs:    Medical: Not on file    Non-medical: Not on file  Tobacco Use  . Smoking status: Former Smoker    Packs/day: 0.00  . Smokeless tobacco: Never Used  Substance and Sexual Activity  . Alcohol use: Yes    Comment: occasional  . Drug use: No  . Sexual activity: Not on file  Lifestyle  . Physical activity:    Days per week: Not on file    Minutes per session: Not on file  . Stress: Not on file  Relationships  . Social connections:    Talks on phone: Not on file    Gets together: Not on file    Attends religious service: Not on file    Active member of club or organization: Not on file    Attends meetings of clubs or organizations: Not on file    Relationship status: Not on file  Other Topics Concern  . Not on file  Social History Narrative  . Not on file   History reviewed. No pertinent family history. - negative except otherwise stated in the family history section Allergies  Allergen Reactions  .  Prednisone     "makes me go blind", "that's how I got cataracts".   Prior to Admission medications   Medication Sig Start Date End Date Taking? Authorizing Provider  insulin aspart protamine- aspart (NOVOLOG MIX 70/30) (70-30) 100 UNIT/ML injection Inject 0.5 mLs (50 Units total) into the skin 2 (two) times daily with a meal. 08/17/15  Yes Dansie, William, PA-C  Blood Glucose Monitoring Suppl (TRUE METRIX METER) w/Device KIT 1 Device by Does not apply route once. 08/17/15   Dansie, William, PA-C   Mr Foot Right Wo Contrast  Result Date: 09/16/2017 CLINICAL DATA:  Soft tissue ulceration of the right forefoot at the fifth MTP joint. Swelling. EXAM: MRI OF THE RIGHT FOREFOOT WITHOUT CONTRAST TECHNIQUE: Multiplanar, multisequence MR imaging of the right forefoot was performed. No intravenous contrast was administered. COMPARISON:  Radiographs dated 09/16/2017 FINDINGS: Bones/Joint/Cartilage There is abnormal edema in the head of the fifth metatarsal and the base of the proximal phalanx of the little toe with adjacent soft tissue edema and soft tissue ulceration at the dorsal lateral aspect of the joint. Physiologic amount of joint fluid. No discrete cortical destruction. The other bones of the forefoot are normal. Muscles and Tendons Normal. Soft tissues Soft tissue edema around the fifth MTP joint   consistent with cellulitis. Focal 15 mm soft tissue ulceration at the plantar lateral aspect of fifth MTP joint. IMPRESSION: Osteomyelitis of the head of the fifth metatarsal and the base of the proximal phalanx of the little toe with adjacent cellulitis. Electronically Signed   By: James  Maxwell M.D.   On: 09/16/2017 09:08   Dg Foot Complete Right  Result Date: 09/16/2017 CLINICAL DATA:  Nonhealing diabetic foot wound.  Infection. EXAM: RIGHT FOOT COMPLETE - 3+ VIEW COMPARISON:  None. FINDINGS: Ill-defined air in the soft tissues lateral to the fifth metatarsal phalangeal joint. Slight decreased density of the  adjacent fifth metatarsal head. No frank bony destructive change. Truncation of the third digit distal phalanx with possible osseous fragment distally. Accessory ossicle versus sequela of remote prior injury but the medial cuneiform-metatarsal articulation. Diffuse dorsal soft tissue edema. No radiopaque foreign body. IMPRESSION: 1. Air in the soft tissues lateral to the fifth metatarsal phalangeal joint likely site of infection/wound. Slight decreased density of the subjacent fifth metatarsal head suggests early osteomyelitis. 2. Abnormal appearance of the third digit distal phalanx with truncation, is likely chronic and may be postsurgical, recommend correlation with surgical history. Electronically Signed   By: Melanie  Ehinger M.D.   On: 09/16/2017 03:45   - pertinent xrays, CT, MRI studies were reviewed and independently interpreted  Positive ROS: All other systems have been reviewed and were otherwise negative with the exception of those mentioned in the HPI and as above.  Physical Exam: General: Alert, no acute distress Psychiatric: Patient is competent for consent with normal mood and affect Lymphatic: No axillary or cervical lymphadenopathy Cardiovascular: No pedal edema Respiratory: No cyanosis, no use of accessory musculature GI: No organomegaly, abdomen is soft and non-tender    Images:  @ENCIMAGES@  Labs:  Lab Results  Component Value Date   HGBA1C 13.0 (H) 09/16/2017   ESRSEDRATE 102 (H) 09/16/2017    Lab Results  Component Value Date   ALBUMIN 3.8 12/18/2014   ALBUMIN 3.7 09/07/2010    Neurologic: Patient does not have protective sensation bilateral lower extremities.   MUSCULOSKELETAL:   Skin: Examination patient has blistering dorsally over the MTP joint.  There is a purulent draining ulcer beneath the fifth metatarsal head.  Patient has a strong dorsalis pedis pulse.  MRI scan radiographs shows chronic destructive bony changes of the fifth metatarsal head  right foot consistent with chronic osteomyelitis.  Assessment: Assessment: Uncontrolled type 2 diabetes with osteomyelitis ulceration abscess right foot fifth metatarsal head.  Plan: Plan: We will plan for fifth ray amputation.  Discussed that if the infection extends further than the fifth ray we may have to resect additional tissue.  Risks and benefits were discussed including persistent infection nonhealing the wound need for additional surgery.  Discussed the importance of proper long-term nutrition management with a diet of greens beans nuts and fruit.  Recommended Dr. Joel Fuhrman's book End diabetes.   Plan for right foot fifth ray amputation tomorrow Wednesday.  N.p.o. after midnight.  Thank you for the consult and the opportunity to see Ronnie Shaw  Lanesha Azzaro, MD [facility] [phone] 6:34 AM     

## 2017-09-17 NOTE — Care Management Note (Signed)
Case Management Note  Patient Details  Name: Karren BurlyMichael Manthe MRN: [id number] Date of Birth: 04/05/1981  Subjective/Objective:                    Action/Plan:  Scheduled hospital follow [facility] at [facility] for September 26, 2017 at 09:10 . Placed on AVS. Provided patient with appointment information and contact information. Explained patient can use the pharmacy at [facility] at discharge to get his prescriptions filled .   Patient voiced understanding to all of above  Expected Discharge Date:  (unknown)               Expected Discharge Plan:  Home/Self Care  In-House Referral:  Nutrition, Financial Counselor  Discharge planning Services  CM Consult, Indigent Health Clinic  Post Acute Care Choice:  NA Choice offered to:  Patient  DME Arranged:  N/A DME Agency:  NA  HH Arranged:  NA HH Agency:  NA  Status of Service:  Completed, signed off  If discussed at Long Length of Stay Meetings, dates discussed:    Additional Comments:  Kingsley PlanWile, Telford Archambeau Marie, RN 09/17/2017, 2:28 PM

## 2017-09-17 NOTE — Progress Notes (Signed)
Inpatient Diabetes Program Recommendations  AACE/ADA: New Consensus Statement on Inpatient Glycemic Control (2015)  Target Ranges:  Prepandial:   less than 140 mg/dL      Peak postprandial:   less than 180 mg/dL (1-2 hours)      Critically ill patients:  140 - 180 mg/dL   Lab Results  Component Value Date   GLUCAP 178 (H) 09/17/2017   HGBA1C 13.0 (H) 09/16/2017    Review of Glycemic Control Results for WAQAS, BRUHL (MRN [id number]) as of 09/17/2017 13:29  Ref. Range 09/16/2017 18:40 09/16/2017 21:14 09/17/2017 07:29 09/17/2017 12:11  Glucose-Capillary Latest Ref Range: 65 - 99 mg/dL 275 (H) 198 (H) 252 (H) 178 (H)   Diabetes history: Type 2 DM Outpatient Diabetes medications: Novolin 70/30- 50 units BID Current orders for Inpatient glycemic control: Novolin 70/30 40 units BID, Novolog 0-20 units TID, Novolog 0-5 units QHS  Inpatient Diabetes Program Recommendations:    Spoke with patient at length regarding outpatient diabetes management. Patient admits to "not doing right and dosing the insulin myself". Patient has been diabetic for several years and would base the amount of insulin on how he felt. Patient states, "If I noticed that I was urinating more often, Id just take another shot of 70/30."  Reviewed patient's current A1c of 13.0%. Explained what a A1c is and what it measures. Also reviewed goal A1c with patient, importance of good glucose control @ home, and blood sugar goals. Reviewed and educated on pathophysiology of DKA, the need for insulin, long term co-morbidities, vascular changes that occur from poor glycemic control and the importance of staying regimented. Discussed differences between long acting vs. Short acting insulins and important considerations of 70/30. Encouraged additional activity when able and reviewed ADA guidelines for weekly activity recommendations.  Patient denies checking BS and will need a meter at discharge. Could not afford the meter and testing supplies.  Had multiple episodes of hypoglycemic symptoms. Would correct with excessive amounts of soda. Education provided on the 15-15 rule and how to prevent excessive hyperglycemia.   Blood glucose meter kit (includes lancets and strips) ([id number])  Will place consult for dietitian. Spent time discussing carb counting and wise decisions for food choices. Will need additional support. Information given for outpatient services at [facility] for continued outpatient support. Case management working to place pt for establishing care. Will continue to follow as patient has no further questions. Patient seems motivated and receptive to information.   Thanks, Bronson Curb, MSN, RNC-OB Diabetes Coordinator [phone] (8a-5p)

## 2017-09-18 ENCOUNTER — Inpatient Hospital Stay (HOSPITAL_COMMUNITY): Payer: Self-pay | Admitting: Anesthesiology

## 2017-09-18 ENCOUNTER — Encounter (HOSPITAL_COMMUNITY)
Admission: EM | Disposition: A | Discharge status: Home / Self Care | Payer: Self-pay | Source: Home / Self Care | Attending: Family Medicine

## 2017-09-18 ENCOUNTER — Encounter (HOSPITAL_COMMUNITY): Payer: Self-pay | Admitting: Anesthesiology

## 2017-09-18 HISTORY — PX: AMPUTATION: SHX166

## 2017-09-18 LAB — CBC WITH DIFFERENTIAL/PLATELET
ABS IMMATURE GRANULOCYTES: 0.1 10*3/uL (ref 0.0–0.1)
BASOS ABS: 0 10*3/uL (ref 0.0–0.1)
BASOS PCT: 0 %
Eosinophils Absolute: 0.1 10*3/uL (ref 0.0–0.7)
Eosinophils Relative: 1 %
HCT: 32.8 % — ABNORMAL LOW (ref 39.0–52.0)
Hemoglobin: 10.6 g/dL — ABNORMAL LOW (ref 13.0–17.0)
IMMATURE GRANULOCYTES: 1 %
Lymphocytes Relative: 18 %
Lymphs Abs: 1.7 10*3/uL (ref 0.7–4.0)
MCH: 28.1 pg (ref 26.0–34.0)
MCHC: 32.3 g/dL (ref 30.0–36.0)
MCV: 87 fL (ref 78.0–100.0)
Monocytes Absolute: 0.9 10*3/uL (ref 0.1–1.0)
Monocytes Relative: 9 %
NEUTROS ABS: 6.7 10*3/uL (ref 1.7–7.7)
NEUTROS PCT: 71 %
PLATELETS: 309 10*3/uL (ref 150–400)
RBC: 3.77 MIL/uL — ABNORMAL LOW (ref 4.22–5.81)
RDW: 12.5 % (ref 11.5–15.5)
WBC: 9.5 10*3/uL (ref 4.0–10.5)

## 2017-09-18 LAB — BASIC METABOLIC PANEL
ANION GAP: 8 (ref 5–15)
BUN: 16 mg/dL (ref 6–20)
CALCIUM: 8.5 mg/dL — AB (ref 8.9–10.3)
CO2: 25 mmol/L (ref 22–32)
Chloride: 99 mmol/L — ABNORMAL LOW (ref 101–111)
Creatinine, Ser: 1.36 mg/dL — ABNORMAL HIGH (ref 0.61–1.24)
GFR calc Af Amer: >60 mL/min (ref >60)
Glucose, Bld: 194 mg/dL — ABNORMAL HIGH (ref 65–99)
POTASSIUM: 3.7 mmol/L (ref 3.5–5.1)
SODIUM: 132 mmol/L — AB (ref 135–145)

## 2017-09-18 LAB — GLUCOSE, CAPILLARY
GLUCOSE-CAPILLARY: 102 mg/dL — AB (ref 65–99)
GLUCOSE-CAPILLARY: 112 mg/dL — AB (ref 65–99)
GLUCOSE-CAPILLARY: 129 mg/dL — AB (ref 65–99)
Glucose-Capillary: 173 mg/dL — ABNORMAL HIGH (ref 65–99)
Glucose-Capillary: 74 mg/dL (ref 65–99)
Glucose-Capillary: 99 mg/dL (ref 65–99)

## 2017-09-18 LAB — SURGICAL PCR SCREEN
MRSA, PCR: NEGATIVE
Staphylococcus aureus: NEGATIVE

## 2017-09-18 LAB — MAGNESIUM: MAGNESIUM: 2.2 mg/dL (ref 1.7–2.4)

## 2017-09-18 SURGERY — AMPUTATION, FOOT, RAY
Anesthesia: General | Site: Toe | Laterality: Right

## 2017-09-18 MED ORDER — FENTANYL CITRATE (PF) 100 MCG/2ML IJ SOLN: 25.0000 ug | INTRAMUSCULAR | Status: DC | PRN

## 2017-09-18 MED ORDER — METHOCARBAMOL 500 MG PO TABS
500.0000 mg | ORAL_TABLET | Freq: Four times a day (QID) | ORAL | Status: DC | PRN
  Administered 2017-09-18 – 2017-09-20 (×4): 500 mg via ORAL
  Filled 2017-09-18 (×4): qty 1

## 2017-09-18 MED ORDER — METHOCARBAMOL 1000 MG/10ML IJ SOLN
500.0000 mg | Freq: Four times a day (QID) | INTRAVENOUS | Status: DC | PRN
  Filled 2017-09-18: qty 5

## 2017-09-18 MED ORDER — LACTATED RINGERS IV SOLN
INTRAVENOUS | Status: DC
  Administered 2017-09-18: 15:00:00 via INTRAVENOUS

## 2017-09-18 MED ORDER — MEPERIDINE HCL 25 MG/ML IJ SOLN: 6.2500 mg | INTRAMUSCULAR | Status: DC | PRN

## 2017-09-18 MED ORDER — JUVEN PO PACK
1.0000 | PACK | Freq: Two times a day (BID) | ORAL | Status: DC
  Filled 2017-09-18 (×2): qty 1

## 2017-09-18 MED ORDER — KETOROLAC TROMETHAMINE 30 MG/ML IJ SOLN: 30.0000 mg | Freq: Once | INTRAMUSCULAR | Status: DC | PRN

## 2017-09-18 MED ORDER — BISACODYL 10 MG RE SUPP: 10.0000 mg | Freq: Every day | RECTAL | Status: DC | PRN

## 2017-09-18 MED ORDER — OXYCODONE HCL 5 MG PO TABS
5.0000 mg | ORAL_TABLET | ORAL | Status: DC | PRN
  Administered 2017-09-18 – 2017-09-20 (×4): 10 mg via ORAL
  Filled 2017-09-18 (×4): qty 2

## 2017-09-18 MED ORDER — PROPOFOL 10 MG/ML IV BOLUS
INTRAVENOUS | Status: AC
  Filled 2017-09-18: qty 20

## 2017-09-18 MED ORDER — DEXTROSE 5 % IV SOLN
3.0000 g | INTRAVENOUS | Status: AC
  Administered 2017-09-18: 3 g via INTRAVENOUS
  Filled 2017-09-18: qty 3

## 2017-09-18 MED ORDER — 0.9 % SODIUM CHLORIDE (POUR BTL) OPTIME
TOPICAL | Status: DC | PRN
  Administered 2017-09-18: 1000 mL

## 2017-09-18 MED ORDER — MIDAZOLAM HCL 2 MG/2ML IJ SOLN
INTRAMUSCULAR | Status: AC
  Filled 2017-09-18: qty 2

## 2017-09-18 MED ORDER — POLYETHYLENE GLYCOL 3350 17 G PO PACK: 17.0000 g | PACK | Freq: Every day | ORAL | Status: DC

## 2017-09-18 MED ORDER — ACETAMINOPHEN 160 MG/5ML PO SOLN
325.0000 mg | ORAL | Status: DC | PRN
Start: 2017-09-18 — End: 2017-09-18

## 2017-09-18 MED ORDER — PROPOFOL 10 MG/ML IV BOLUS
INTRAVENOUS | Status: DC | PRN
  Administered 2017-09-18: 200 mg via INTRAVENOUS

## 2017-09-18 MED ORDER — ONDANSETRON HCL 4 MG/2ML IJ SOLN
INTRAMUSCULAR | Status: DC | PRN
  Administered 2017-09-18: 4 mg via INTRAVENOUS

## 2017-09-18 MED ORDER — DOCUSATE SODIUM 100 MG PO CAPS
100.0000 mg | ORAL_CAPSULE | Freq: Two times a day (BID) | ORAL | Status: DC
  Administered 2017-09-18 – 2017-09-21 (×4): 100 mg via ORAL
  Filled 2017-09-18 (×6): qty 1

## 2017-09-18 MED ORDER — PROMETHAZINE HCL 25 MG/ML IJ SOLN: 6.2500 mg | INTRAMUSCULAR | Status: DC | PRN

## 2017-09-18 MED ORDER — MAGNESIUM CITRATE PO SOLN: 1.0000 | Freq: Once | ORAL | Status: DC | PRN

## 2017-09-18 MED ORDER — FENTANYL CITRATE (PF) 100 MCG/2ML IJ SOLN
INTRAMUSCULAR | Status: DC | PRN
  Administered 2017-09-18: 100 ug via INTRAVENOUS

## 2017-09-18 MED ORDER — ONDANSETRON HCL 4 MG/2ML IJ SOLN: 4.0000 mg | Freq: Four times a day (QID) | INTRAMUSCULAR | Status: DC | PRN

## 2017-09-18 MED ORDER — OXYCODONE HCL 5 MG PO TABS: 5.0000 mg | ORAL_TABLET | Freq: Once | ORAL | Status: DC | PRN

## 2017-09-18 MED ORDER — POLYETHYLENE GLYCOL 3350 17 G PO PACK: 17.0000 g | PACK | Freq: Every day | ORAL | Status: DC | PRN

## 2017-09-18 MED ORDER — SODIUM CHLORIDE 0.9 % IV SOLN
INTRAVENOUS | Status: DC
  Administered 2017-09-18: 1 mL via INTRAVENOUS

## 2017-09-18 MED ORDER — FENTANYL CITRATE (PF) 250 MCG/5ML IJ SOLN
INTRAMUSCULAR | Status: AC
  Filled 2017-09-18: qty 5

## 2017-09-18 MED ORDER — MIDAZOLAM HCL 5 MG/5ML IJ SOLN
INTRAMUSCULAR | Status: DC | PRN
  Administered 2017-09-18: 2 mg via INTRAVENOUS

## 2017-09-18 MED ORDER — OXYCODONE HCL 5 MG/5ML PO SOLN: 5.0000 mg | Freq: Once | ORAL | Status: DC | PRN

## 2017-09-18 MED ORDER — METOCLOPRAMIDE HCL 5 MG PO TABS: 5.0000 mg | ORAL_TABLET | Freq: Three times a day (TID) | ORAL | Status: DC | PRN

## 2017-09-18 MED ORDER — MORPHINE SULFATE (PF) 4 MG/ML IV SOLN
4.0000 mg | Freq: Once | INTRAVENOUS | Status: AC
  Administered 2017-09-18: 4 mg via INTRAVENOUS
  Filled 2017-09-18: qty 1

## 2017-09-18 MED ORDER — ACETAMINOPHEN 325 MG PO TABS: 325.0000 mg | ORAL_TABLET | ORAL | Status: DC | PRN

## 2017-09-18 MED ORDER — CHLORHEXIDINE GLUCONATE 4 % EX LIQD
60.0000 mL | Freq: Once | CUTANEOUS | Status: AC
  Administered 2017-09-18: 4 via TOPICAL

## 2017-09-18 MED ORDER — LIDOCAINE 2% (20 MG/ML) 5 ML SYRINGE
INTRAMUSCULAR | Status: DC | PRN
  Administered 2017-09-18: 100 mg via INTRAVENOUS

## 2017-09-18 MED ORDER — ONDANSETRON HCL 4 MG PO TABS: 4.0000 mg | ORAL_TABLET | Freq: Four times a day (QID) | ORAL | Status: DC | PRN

## 2017-09-18 MED ORDER — METOCLOPRAMIDE HCL 5 MG/ML IJ SOLN: 5.0000 mg | Freq: Three times a day (TID) | INTRAMUSCULAR | Status: DC | PRN

## 2017-09-18 MED ORDER — VANCOMYCIN HCL IN DEXTROSE 1-5 GM/200ML-% IV SOLN
1000.0000 mg | Freq: Two times a day (BID) | INTRAVENOUS | Status: DC
  Administered 2017-09-18 – 2017-09-20 (×4): 1000 mg via INTRAVENOUS
  Filled 2017-09-18 (×5): qty 200

## 2017-09-18 SURGICAL SUPPLY — 28 items
BENZOIN TINCTURE PRP APPL 2/3 (GAUZE/BANDAGES/DRESSINGS) ×2 IMPLANT
BLADE SAW SGTL MED 73X18.5 STR (BLADE) IMPLANT
BLADE SURG 21 STRL SS (BLADE) ×3 IMPLANT
BNDG COHESIVE 4X5 TAN STRL (GAUZE/BANDAGES/DRESSINGS) ×3 IMPLANT
BNDG GAUZE ELAST 4 BULKY (GAUZE/BANDAGES/DRESSINGS) ×1 IMPLANT
COVER SURGICAL LIGHT HANDLE (MISCELLANEOUS) ×4 IMPLANT
DRAPE U-SHAPE 47X51 STRL (DRAPES) ×6 IMPLANT
DRSG ADAPTIC 3X8 NADH LF (GAUZE/BANDAGES/DRESSINGS) ×3 IMPLANT
DRSG PAD ABDOMINAL 8X10 ST (GAUZE/BANDAGES/DRESSINGS) ×2 IMPLANT
DURAPREP 26ML APPLICATOR (WOUND CARE) ×3 IMPLANT
ELECT REM PT RETURN 9FT ADLT (ELECTROSURGICAL) ×3
ELECTRODE REM PT RTRN 9FT ADLT (ELECTROSURGICAL) ×1 IMPLANT
GLOVE BIOGEL PI IND STRL 9 (GLOVE) ×1 IMPLANT
GLOVE BIOGEL PI INDICATOR 9 (GLOVE) ×2
GLOVE SURG ORTHO 9.0 STRL STRW (GLOVE) ×3 IMPLANT
GOWN STRL REUS W/ TWL XL LVL3 (GOWN DISPOSABLE) ×2 IMPLANT
GOWN STRL REUS W/TWL XL LVL3 (GOWN DISPOSABLE) ×4
KIT BASIN OR (CUSTOM PROCEDURE TRAY) ×3 IMPLANT
KIT TURNOVER KIT B (KITS) ×3 IMPLANT
NS IRRIG 1000ML POUR BTL (IV SOLUTION) ×3 IMPLANT
PACK ORTHO EXTREMITY (CUSTOM PROCEDURE TRAY) ×3 IMPLANT
PAD ARMBOARD 7.5X6 YLW CONV (MISCELLANEOUS) ×4 IMPLANT
STOCKINETTE IMPERVIOUS LG (DRAPES) IMPLANT
SUT ETHILON 2 0 PSLX (SUTURE) ×3 IMPLANT
TOWEL OR 17X26 10 PK STRL BLUE (TOWEL DISPOSABLE) ×3 IMPLANT
TUBE CONNECTING 12'X1/4 (SUCTIONS)
TUBE CONNECTING 12X1/4 (SUCTIONS) ×1 IMPLANT
YANKAUER SUCT BULB TIP NO VENT (SUCTIONS) ×1 IMPLANT

## 2017-09-18 NOTE — Anesthesia Preprocedure Evaluation (Addendum)
Anesthesia Evaluation  Patient identified by MRN, date of birth, ID band Patient awake    Reviewed: Allergy & Precautions, NPO status , Patient's Chart, lab work & pertinent test results  Airway Mallampati: I       Dental no notable dental hx. (+) Teeth Intact, Dental Advisory Given   Pulmonary former smoker,    Pulmonary exam normal breath sounds clear to auscultation       Cardiovascular negative cardio ROS Normal cardiovascular exam Rhythm:Regular Rate:Normal     Neuro/Psych    GI/Hepatic negative GI ROS,   Endo/Other  diabetes, Type 2, Insulin Dependent  Renal/GU Renal InsufficiencyRenal disease  negative genitourinary   Musculoskeletal   Abdominal (+) + obese,   Peds  Hematology  (+) Blood dyscrasia, anemia ,   Anesthesia Other Findings   Reproductive/Obstetrics                            Anesthesia Physical Anesthesia Plan  ASA: II  Anesthesia Plan: General   Post-op Pain Management:    Induction:   PONV Risk Score and Plan: 3 and Ondansetron and Midazolam  Airway Management Planned: LMA  Additional Equipment:   Intra-op Plan:   Post-operative Plan:   Informed Consent: I have reviewed the patients History and Physical, chart, labs and discussed the procedure including the risks, benefits and alternatives for the proposed anesthesia with the patient or authorized representative who has indicated his/her understanding and acceptance.   Dental advisory given  Plan Discussed with: CRNA and Surgeon  Anesthesia Plan Comments:         Anesthesia Quick Evaluation

## 2017-09-18 NOTE — Transfer of Care (Signed)
Immediate Anesthesia Transfer of Care Note  Patient: Ronnie Shaw  Procedure(s) Performed: RIGHT FOOT 5TH RAY AMPUTATION (Right Toe)  Patient Location: PACU  Anesthesia Type:General  Level of Consciousness: awake, alert  and oriented  Airway & Oxygen Therapy: Patient Spontanous Breathing and Patient connected to nasal cannula oxygen  Post-op Assessment: Report given to RN and Post -op Vital signs reviewed and stable  Post vital signs: Reviewed and stable  Last Vitals:  Vitals Value Taken Time  BP 141/92 09/18/2017  3:54 PM  Temp    Pulse 93 09/18/2017  3:55 PM  Resp 18 09/18/2017  3:55 PM  SpO2 98 % 09/18/2017  3:55 PM  Vitals shown include unvalidated device data.  Last Pain:  Vitals:   09/18/17 0700  TempSrc:   PainSc: 0-No pain         Complications: No apparent anesthesia complications

## 2017-09-18 NOTE — Progress Notes (Signed)
Orthopedic Tech Progress Note Patient Details:  Ronnie Shaw 05/15/1980 [id number]  Ortho Devices Type of Ortho Device: Postop shoe/boot Ortho Device/Splint Location: RLE Ortho Device/Splint Interventions: Ordered   Post Interventions Patient Tolerated: Well Instructions Provided: Care of device   [name], [name] 09/18/2017, 6:25 PM

## 2017-09-18 NOTE — Progress Notes (Signed)
Pt for right foot 5th metatarsal amputation with consent, NPO midnight, PCR done pending, CHG completed with family at the bedside.

## 2017-09-18 NOTE — Progress Notes (Signed)
Pt transfer to OR latest CBG 99, alert and oriented.

## 2017-09-18 NOTE — Progress Notes (Signed)
Patient c/o severe pain to right foot incision site 8/10,oxycodone 5mg  PRN given at 1643, Tylenol and Robaxin given at 2001 with no effect. Md on call notified.

## 2017-09-18 NOTE — Anesthesia Postprocedure Evaluation (Signed)
Anesthesia Post Note  Patient: Ronnie Shaw  Procedure(s) Performed: RIGHT FOOT 5TH RAY AMPUTATION (Right Toe)     Patient location during evaluation: PACU Anesthesia Type: General Level of consciousness: awake Pain management: pain level controlled Vital Signs Assessment: post-procedure vital signs reviewed and stable Respiratory status: spontaneous breathing Cardiovascular status: stable Postop Assessment: no apparent nausea or vomiting Anesthetic complications: no    Last Vitals:  Vitals:   09/18/17 1552 09/18/17 1619  BP: (!) 141/92 (!) 154/89  Pulse: 89 85  Resp: 15   Temp: 36.4 C 36.9 C  SpO2: 95%     Last Pain:  Vitals:   09/18/17 1619  TempSrc: Oral  PainSc:    Pain Goal:                 Ezinne Yogi JR,JOHN Deionte Spivack

## 2017-09-18 NOTE — Progress Notes (Signed)
Pharmacy Antibiotic Note  Ronnie Shaw is a 37 y.o. male with right foot ulceration presented to the ED on 09/16/2017 with c/o right foot pain and discharge.  MRI confirmed osteomyelitis.  Pharmacy has been consulted to dose vancomycin and Zosyn.  Renal function is worsening and will be unable to obtain a vanc trough today as patient will be in the OR.  Afebrile, WBC WNL.   Plan: Change vanc to 1gm IV Q12H Continue Zosyn EID 3.375gm IV Q8H Monitor renal fxn, clinical progress, abx LOT post amputation to decide on vanc trough   Height: 6\' 1"  (185.4 cm) Weight: 285 lb 15 oz (129.7 kg) IBW/kg (Calculated) : 79.9  Temp (24hrs), Avg:98.7 F (37.1 C), Min:98.4 F (36.9 C), Max:98.9 F (37.2 C)  Recent Labs  Lab 09/16/17 0326 09/16/17 0337 09/18/17 0605  WBC 14.6*  --  9.5  CREATININE 0.96  --  1.36*  LATICACIDVEN  --  1.25  --     Estimated Creatinine Clearance: 107 mL/min (A) (by C-G formula based on SCr of 1.36 mg/dL (H)).    Allergies  Allergen Reactions  . Prednisone Other (See Comments)    "makes me go blind", "that's how I got cataracts".     Vanc 6/3 >> Zosyn 6/3 >>  6/3 BCx - NGTD 6/3 HIV - negative   Audra Kagel D. Laney Potashang, PharmD, BCPS, BCCCP Pager:  [phone] 09/18/2017, 10:05 AM

## 2017-09-18 NOTE — Op Note (Signed)
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 With application of Praveena wound VAC    SURGEON:  Nadara MustardMarcus V Shandrea Lusk, MD  PHYSICIAN ASSISTANT:None ANESTHESIA:   General  PREOPERATIVE INDICATIONS:  Ronnie BurlyMichael Janczak is a  37 y.o. male with a diagnosis of Osteomyelitis Right 5th Metatarsal who failed conservative measures and elected for surgical management.    The risks benefits and alternatives were discussed with the patient preoperatively including but not limited to the risks of infection, bleeding, nerve injury, cardiopulmonary complications, the need for revision surgery, among others, and the patient was willing to proceed.  OPERATIVE IMPLANTS: Prevena   @ENCIMAGES @  OPERATIVE FINDINGS: abscess of toe, cultures sent  OPERATIVE PROCEDURE: Patient was brought to the operating room and underwent a general anesthetic.  After adequate levels of anesthesia were obtained patient's right lower extremity was prepped using DuraPrep draped into a sterile field a timeout was called.  Elliptical incision was made around the toe and the ulcerative tissue.  The ray was resected through the base of the fifth metatarsal.  There was further abscess extending dorsally and this was debrided and all soft tissue that was in contact with the abscess was excised.  The wound was irrigated with normal saline remaining tissue was healthy and viable.  Cultures were obtained from the toe.  Further irrigation and debridement were performed with electrocautery for hemostasis.  Local tissue rearrangement was used to close the wound that was 7 x 3 cm.  This was closed using 2-0 nylon.  The wound edges approximated well.  A Praveena wound VAC was applied this had a good suction fit.   DISCHARGE PLANNING:  Antibiotic  duration:72 hours IV antibiotics post op, then oral antibiotics for 4 weeks at discharge  Weightbearing: non weight bearing right foot  Pain medication: ordered  Dressing care/ Wound OZD:GUYQIVAC:wound VAC to remain in place for 1 week  Ambulatory devices:walker or crutches  Discharge to: home  Follow-up: In the office 1 week post operative.

## 2017-09-18 NOTE — Progress Notes (Signed)
PROGRESS NOTE    Ronnie Shaw  ZOX:[id number] DOB: [birth year] DOA: 09/16/2017 PCP: Patient, No Pcp Per      Brief Narrative:  [name] is a 37 y.o. M with IDDM poorly controlled and sarcoidosis who presents with foot pain, found to have osteomyelitis of the right 5th digit.     Assessment & Plan:  Osteomyelitis -Continue IV antbiotics -To OR today  Diabetes Blood sugars actually quite good -Continue 70/30 -Consult nutrtion -Consult Diabetes ed  Anemia of chronic disease Stable Hgb today  Hematuria -Repeat UA in 6 weeks  HTN -Continue PRN hydralazine   DVT prophylaxis:  Code Status: FULL Family Communication: none present MDM and disposition Plan: The below labs and imaging reports were reviewed and summarized above.    The patient was admitted with osteomyelitis. To the OR today, then transition to oral antibiotisc and home within 2-3 days, per ortho   Consultants:   Orthopedics  Procedures:   5th ray amutation  Antimicrobials:   Vancomycin/Zosyn    Subjective: Feeling well.  No fever, nausea, vomiting.  No cough, sputum.  No chest pain, dyspnea.   Objective: Vitals:   09/17/17 2059 09/18/17 0459 09/18/17 1552 09/18/17 1619  BP: (!) 164/90 128/77 (!) 141/92 (!) 154/89  Pulse: 91 87 89 85  Resp: 16 18 15    Temp: 98.9 F (37.2 C) 98.4 F (36.9 C) 97.6 F (36.4 C) 98.5 F (36.9 C)  TempSrc: Oral Oral  Oral  SpO2: 100% 100% 95%   Weight:      Height:        Intake/Output Summary (Last 24 hours) at 09/18/2017 1952 Last data filed at 09/18/2017 1700 Gross per 24 hour  Intake 753.5 ml  Output 20 ml  Net 733.5 ml   Filed Weights   09/16/17 0228 09/16/17 0504 09/16/17 2109  Weight: 127 kg (280 lb) 127 kg (280 lb) 129.7 kg (285 lb 15 oz)    Examination: General appearance:  adult male, alert and in no acute distress.   HEENT: Anicteric, conjunctiva pink, lids and lashes normal. No nasal deformity, discharge, epistaxis.  Lips moist.     Skin: Warm and dry.  no jaundice.  No suspicious rashes or lesions. Cardiac: RRR, nl S1-S2, no murmurs appreciated.  Capillary refill is brisk.  JVPnot visible.  No LE edema.  Radia  pulses 2+ and symmetric. Respiratory: Normal respiratory rate and rhythm.  CTAB without rales or wheezes. Abdomen: Abdomen soft.  no TTP. No ascites, distension, hepatosplenomegaly.   MSK: No deformities or effusions.  The right foot is bandaged. Neuro: Awake and alert.  EOMI, moves all extremities. Speech fluent.    Psych: Sensorium intact and responding to questions, attention normal. Affect normal.  Judgment and insight appear normal.    Data Reviewed: I have personally reviewed following labs and imaging studies:  CBC: Recent Labs  Lab 09/16/17 0326 09/18/17 0605  WBC 14.6* 9.5  NEUTROABS 11.1* 6.7  HGB 12.4* 10.6*  HCT 36.0* 32.8*  MCV 85.1 87.0  PLT 334 309   Basic Metabolic Panel: Recent Labs  Lab 09/16/17 0326 09/18/17 0605  NA 127* 132*  K 4.3 3.7  CL 90* 99*  CO2 23 25  GLUCOSE 391* 194*  BUN 12 16  CREATININE 0.96 1.36*  CALCIUM 9.0 8.5*  MG  --  2.2   GFR: Estimated Creatinine Clearance: 107 mL/min (A) (by C-G formula based on SCr of 1.36 mg/dL (H)). Liver Function Tests: No results for input(s): AST, ALT, ALKPHOS,  BILITOT, PROT, ALBUMIN in the last 168 hours. No results for input(s): LIPASE, AMYLASE in the last 168 hours. No results for input(s): AMMONIA in the last 168 hours. Coagulation Profile: No results for input(s): INR, PROTIME in the last 168 hours. Cardiac Enzymes: No results for input(s): CKTOTAL, CKMB, CKMBINDEX, TROPONINI in the last 168 hours. BNP (last 3 results) No results for input(s): PROBNP in the last 8760 hours. HbA1C: Recent Labs    09/16/17 0326  HGBA1C 13.0*   CBG: Recent Labs  Lab 09/18/17 0753 09/18/17 1213 09/18/17 1422 09/18/17 1558 09/18/17 1702  GLUCAP 173* 74 99 102* 112*   Lipid Profile: No results for input(s): CHOL, HDL,  LDLCALC, TRIG, CHOLHDL, LDLDIRECT in the last 72 hours. Thyroid Function Tests: No results for input(s): TSH, T4TOTAL, FREET4, T3FREE, THYROIDAB in the last 72 hours. Anemia Panel: Recent Labs    09/16/17 0557 09/16/17 0702  VITAMINB12 371  --   FOLATE 11.7  --   FERRITIN 693*  --   TIBC 237*  --   IRON 23*  --   RETICCTPCT  --  1.2   Urine analysis:    Component Value Date/Time   COLORURINE YELLOW 09/16/2017 0249   APPEARANCEUR CLEAR 09/16/2017 0249   LABSPEC 1.031 (H) 09/16/2017 0249   PHURINE 6.0 09/16/2017 0249   GLUCOSEU >=500 (A) 09/16/2017 0249   HGBUR MODERATE (A) 09/16/2017 0249   BILIRUBINUR NEGATIVE 09/16/2017 0249   KETONESUR 20 (A) 09/16/2017 0249   PROTEINUR NEGATIVE 09/16/2017 0249   UROBILINOGEN 1.0 12/18/2014 0533   NITRITE NEGATIVE 09/16/2017 0249   LEUKOCYTESUR NEGATIVE 09/16/2017 0249   Sepsis Labs: @LABRCNTIP (procalcitonin:4,lacticacidven:4)  ) Recent Results (from the past 240 hour(s))  Blood culture (routine x 2)     Status: None (Preliminary result)   Collection Time: 09/16/17  2:54 AM  Result Value Ref Range Status   Specimen Description   Final    RIGHT ANTECUBITAL Performed at [facility], [address] [place], [place] [address]    Special Requests   Final    BOTTLES DRAWN AEROBIC AND ANAEROBIC Blood Culture adequate volume Performed at [facility], [address], [place], [place] [address]    Culture   Final    NO GROWTH 2 DAYS Performed at [facility], [address] [place], [place] [address]    Report Status PENDING  Incomplete  Blood culture (routine x 2)     Status: None (Preliminary result)   Collection Time: 09/16/17  3:27 AM  Result Value Ref Range Status   Specimen Description   Final    RIGHT ANTECUBITAL Performed at [facility], [address], [place], [place] [address]    Special Requests   Final    BOTTLES DRAWN AEROBIC AND ANAEROBIC Blood  Culture adequate volume Performed at [facility], [address] [place], [place] [address]    Culture   Final    NO GROWTH 2 DAYS Performed at [facility], [address], [place], [place] [address]    Report Status PENDING  Incomplete  Surgical pcr screen     Status: None   Collection Time: 09/18/17  1:28 PM  Result Value Ref Range Status   MRSA, PCR NEGATIVE NEGATIVE Final   Staphylococcus aureus NEGATIVE NEGATIVE Final    Comment: (NOTE) The Xpert SA Assay (FDA approved for NASAL specimens in patients 67 years of age and older), is one component of a comprehensive surveillance  program. It is not intended to diagnose infection nor to guide or monitor treatment. Performed at [facility], [address] [place], [place] [address]          Radiology Studies: No results found.      Scheduled Meds: . docusate sodium  100 mg Oral BID  . insulin aspart  0-20 Units Subcutaneous TID WC  . insulin aspart  0-5 Units Subcutaneous QHS  . insulin aspart protamine- aspart  40 Units Subcutaneous BID WC  . [START ON 09/19/2017] nutrition supplement (JUVEN)  1 packet Oral BID BM   Continuous Infusions: . sodium chloride 1 mL (09/18/17 1756)  . lactated ringers 10 mL/hr at 09/18/17 1443  . methocarbamol (ROBAXIN)  IV    . piperacillin-tazobactam (ZOSYN)  IV Stopped (09/18/17 1654)  . vancomycin 1,000 mg (09/18/17 1755)     LOS: 2 days    Time spent: 25 minutes    Alberteen Samhristopher P Danford, MD [facility] 09/18/2017, 7:52 PM     Pager [phone] --- please page though AMION:  [email or web address] Password [email or web address] If 7PM-7AM, please contact night-coverage

## 2017-09-18 NOTE — Progress Notes (Addendum)
Initial Nutrition Assessment  DOCUMENTATION CODES:   Obesity unspecified  INTERVENTION:   -1 packet Juven BID, each packet provides 80 calories, 8 grams of carbohydrate, and 14 grams of amino acids; supplement contains CaHMB, glutamine, and arginine, to promote wound healing  NUTRITION DIAGNOSIS:   Increased nutrient needs related to wound healing as evidenced by estimated needs.  GOAL:   Patient will meet greater than or equal to 90% of their needs  MONITOR:   PO intake, Supplement acceptance, Labs, Weight trends, Skin, I & O's  REASON FOR ASSESSMENT:   Consult Diet education  ASSESSMENT:   Ronnie Shaw is a 37 y.o. M with IDDM poorly controlled and sarcoidosis who presents with foot pain, found to have osteomyelitis of the right 5th digit.    Pt admitted with rt foot osteomyelitis.    6/5- S/p PROCEDURE:  RIGHT FOOT 5TH RAY AMPUTATION Local tissue rearrangement for wound closure 3 x 7 cm. With application of Praveena wound VAC   Case discussed with RN; pt preparing to go down for surgery (rt foot 5th ray amputation). Per RN, pt complains of being hungry due to NPO status.   Pt with good appetite; noted meal completion 100%.   Last Hgb A1c: 13 (09/16/17). PTA DM medications 50 units insulin aspart protamine-aspart BID). Per DM coordinator note, pt does not self-test and has not been administering insulin doses correctly.   Labs reviewed: Na: 132, CBGS: 74-191 (inaptient orders for glycemic control are 0-20 units insulin aspart TID with meals, 0-5 units insulin aspart q HS, and 40 units insulin aspart protamine-aspart BID).   Diet Order:   Diet Order           Diet Carb Modified Fluid consistency: Thin; Room service appropriate? Yes  Diet effective now          EDUCATION NEEDS:   No education needs have been identified at this time  Skin:  Skin Assessment: Skin Integrity Issues: Skin Integrity Issues:: Wound VAC Wound Vac: rt foot  Last BM:   09/14/17  Height:   Ht Readings from Last 1 Encounters:  09/16/17 6\' 1"  (1.854 m)    Weight:   Wt Readings from Last 1 Encounters:  09/16/17 285 lb 15 oz (129.7 kg)    Ideal Body Weight:  83.6 kg  BMI:  Body mass index is 37.72 kg/m.  Estimated Nutritional Needs:   Kcal:  2000-2200  Protein:  125-140 grams  Fluid:  > 2.0 L    Fynn Adel A. Mayford KnifeWilliams, RD, LDN, CDE Pager: [phone] After hours Pager: [phone]

## 2017-09-18 NOTE — Progress Notes (Signed)
Pt back from OR s/p right 5th metatarsal amputation with wound vac 125, wound site dry and intact, pt alert and oriented , given pain meds oxy IR as ordered.

## 2017-09-18 NOTE — Interval H&P Note (Signed)
History and Physical Interval Note:  09/18/2017 6:37 AM  Karren BurlyMichael Hosie  has presented today for surgery, with the diagnosis of Osteomyelitis Right 5th Metatarsal  The various methods of treatment have been discussed with the patient and family. After consideration of risks, benefits and other options for treatment, the patient has consented to  Procedure(s): RIGHT FOOT 5TH RAY AMPUTATION (Right) as a surgical intervention .  The patient's history has been reviewed, patient examined, no change in status, stable for surgery.  I have reviewed the patient's chart and labs.  Questions were answered to the patient's satisfaction.     Nadara MustardMarcus V Duda

## 2017-09-18 NOTE — Anesthesia Procedure Notes (Signed)
Procedure Name: LMA Insertion Date/Time: 09/18/2017 3:11 PM Performed by: Jed LimerickHarder, Maston Wight S, CRNA Pre-anesthesia Checklist: Patient identified, Emergency Drugs available, Suction available and Patient being monitored Patient Re-evaluated:Patient Re-evaluated prior to induction Oxygen Delivery Method: Circle System Utilized Preoxygenation: Pre-oxygenation with 100% oxygen Induction Type: IV induction Ventilation: Mask ventilation without difficulty LMA: LMA inserted LMA Size: 5.0 Number of attempts: 1 Placement Confirmation: positive ETCO2 Tube secured with: Tape Dental Injury: Teeth and Oropharynx as per pre-operative assessment

## 2017-09-19 ENCOUNTER — Encounter (HOSPITAL_COMMUNITY): Payer: Self-pay | Admitting: Orthopedic Surgery

## 2017-09-19 LAB — CBC WITH DIFFERENTIAL/PLATELET
ABS IMMATURE GRANULOCYTES: 0.1 10*3/uL (ref 0.0–0.1)
BASOS PCT: 0 %
Basophils Absolute: 0 10*3/uL (ref 0.0–0.1)
EOS ABS: 0.1 10*3/uL (ref 0.0–0.7)
Eosinophils Relative: 2 %
HCT: 30.5 % — ABNORMAL LOW (ref 39.0–52.0)
Hemoglobin: 9.9 g/dL — ABNORMAL LOW (ref 13.0–17.0)
IMMATURE GRANULOCYTES: 1 %
Lymphocytes Relative: 20 %
Lymphs Abs: 1.8 10*3/uL (ref 0.7–4.0)
MCH: 28.4 pg (ref 26.0–34.0)
MCHC: 32.5 g/dL (ref 30.0–36.0)
MCV: 87.6 fL (ref 78.0–100.0)
Monocytes Absolute: 0.8 10*3/uL (ref 0.1–1.0)
Monocytes Relative: 9 %
NEUTROS ABS: 6.3 10*3/uL (ref 1.7–7.7)
Neutrophils Relative %: 68 %
PLATELETS: 336 10*3/uL (ref 150–400)
RBC: 3.48 MIL/uL — AB (ref 4.22–5.81)
RDW: 12.6 % (ref 11.5–15.5)
WBC: 9.2 10*3/uL (ref 4.0–10.5)

## 2017-09-19 LAB — BASIC METABOLIC PANEL
ANION GAP: 9 (ref 5–15)
BUN: 10 mg/dL (ref 6–20)
CALCIUM: 8.4 mg/dL — AB (ref 8.9–10.3)
CO2: 25 mmol/L (ref 22–32)
Chloride: 99 mmol/L — ABNORMAL LOW (ref 101–111)
Creatinine, Ser: 1.39 mg/dL — ABNORMAL HIGH (ref 0.61–1.24)
Glucose, Bld: 148 mg/dL — ABNORMAL HIGH (ref 65–99)
POTASSIUM: 3.7 mmol/L (ref 3.5–5.1)
SODIUM: 133 mmol/L — AB (ref 135–145)

## 2017-09-19 LAB — GLUCOSE, CAPILLARY
GLUCOSE-CAPILLARY: 152 mg/dL — AB (ref 65–99)
GLUCOSE-CAPILLARY: 82 mg/dL (ref 65–99)
Glucose-Capillary: 109 mg/dL — ABNORMAL HIGH (ref 65–99)
Glucose-Capillary: 144 mg/dL — ABNORMAL HIGH (ref 65–99)

## 2017-09-19 MED ORDER — MORPHINE SULFATE (PF) 4 MG/ML IV SOLN
4.0000 mg | INTRAVENOUS | Status: DC | PRN
  Administered 2017-09-19: 4 mg via INTRAVENOUS
  Filled 2017-09-19: qty 1

## 2017-09-19 MED ORDER — ADULT MULTIVITAMIN W/MINERALS CH
1.0000 | ORAL_TABLET | Freq: Every day | ORAL | Status: DC
  Administered 2017-09-19 – 2017-09-21 (×3): 1 via ORAL
  Filled 2017-09-19 (×3): qty 1

## 2017-09-19 MED ORDER — GLUCERNA SHAKE PO LIQD
237.0000 mL | Freq: Three times a day (TID) | ORAL | Status: DC
  Administered 2017-09-19 – 2017-09-21 (×6): 237 mL via ORAL

## 2017-09-19 MED ORDER — MORPHINE SULFATE (PF) 2 MG/ML IV SOLN: 2.0000 mg | INTRAVENOUS | Status: DC | PRN

## 2017-09-19 NOTE — Progress Notes (Signed)
Patient ID: Ronnie Shaw, male   DOB: 04/19/1980, 37 y.o.   MRN: [id number] Postoperative day 1 right foot fifth ray amputation.  Patient did have an extensive abscess that was debrided.  Patient should have at least 3 days IV antibiotics postoperatively and discharged on oral antibiotics.  Told patient that ideally he should be nonweightbearing on the right lower extremity but occasional touchdown weightbearing is okay.  The wound VAC will remain in place for 1 week.

## 2017-09-19 NOTE — Progress Notes (Addendum)
PROGRESS NOTE    Ronnie Shaw  ZOX:[id number] DOB: 07/27/1980 DOA: 09/16/2017 PCP: Patient, No Pcp Per      Brief Narrative:  Ronnie Shaw is a 37 y.o. M with IDDM poorly controlled and sarcoidosis who presents with foot pain, found to have osteomyelitis of the right 5th digit.  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     Assessment & Plan:  Osteomyelitis and abscess of the right 5th ray To the OR on 09/18/17.  Abscess found.  Cultures sent, gram stain with both GPCs and GVRs.  Blood cultures negative. -Continue IV vancomycin, Zosyn -Wound vac in place, CM consult for [facility] RN -PT eval  -Continue oxycodone, acetaminophen and morphine 4 mg q3hrs for pain   Diabetes Glucose control good. -Continue insulin 70/30 BID -Continue SSI  -Consult nutrtion -Consult Diabetes ed  Anemia of chronic disease Slight drop in Hgb, appropriate for surgery. -Trend Hgb  Hematuria -Repeat UA in 6 weeks  HTN -Continue PRN hydralazine, none given so far  Hyponatremia Mild.      DVT prophylaxis: SCDs Code Status: FULL Family Communication: Brother sleeping on couch. MDM and disposition Plan: The below labs and imaging reports were reviewed and summarized above.  He will continue on IV morphine q3hrs for the next 24 hours, then transition to oral pain medicaiton.  The patient was admitted with osteomyelitis, went to the OR on 6/5, found to have abscess,.  We will continue for 72 hours IV antibiotics, and transition home with 4 more weeks oral antibiotics.  Will need PT eval today given he has nonweightbearing on the right lower extremity.  He will also need case management consultation for home health care for his wound VAC which will help for 1 week.   Consultants:   Orthopedics  Procedures:   5th ray amutation 09/18/17  Antimicrobials:   Vancomycin 6/2 >>  Zosyn  6/2 >>   Subjective: Pain in his right foot is severe but tolerated with morphine.  No fever, nausea,  vomiting.  No new cough, sputum, dyspnea, chest pain.  Objective: Vitals:   09/18/17 1619 09/18/17 2114 09/19/17 0106 09/19/17 0559  BP: (!) 154/89 130/79 138/88 132/86  Pulse: 85 87 81 77  Resp:  18 16 17   Temp: 98.5 F (36.9 C) 99.1 F (37.3 C) 98.2 F (36.8 C) 98.2 F (36.8 C)  TempSrc: Oral Oral Oral Oral  SpO2:  99% 98% 98%  Weight:      Height:        Intake/Output Summary (Last 24 hours) at 09/19/2017 0824 Last data filed at 09/19/2017 0615 Gross per 24 hour  Intake 1743.5 ml  Output 720 ml  Net 1023.5 ml   Filed Weights   09/16/17 0228 09/16/17 0504 09/16/17 2109  Weight: 127 kg (280 lb) 127 kg (280 lb) 129.7 kg (285 lb 15 oz)    Examination: General appearance: Adult male, lying in bed, arouses easily from sleep, interactive.   HEENT: Anicteric, conjunctival pink, lids and lashes normal.  No nasal deformity, discharge, epistaxis.  Lips moist, teeth normal, oropharynx moist, no oral lesions.   Skin: Skin warm and dry without suspicious rashes or lesions.  There is no induration or swelling around the wound VAC on the right amputation site. Cardiac: Regular rate and rhythm, no murmurs appreciated.  No lower extremity edema.Marland Kitchen Respiratory: Normal respiratory rate and rhythm, no rales or wheezes. Abdomen: Abdomen soft, no tenderness to palpation, no guarding, no ascites or hepatosplenomegaly. MSK: The right fifth digit has  been amputated, there is a wound VAC in place. Neuro: Awake and alert, cranial nerves normal, extraocular movements intact, moves all extremities with normal strength and coordination.  Speech fluent. Psych: Sensorium intact and responding to questions, intention normal, affect normal, judgment and insight appear normal.    Data Reviewed: I have personally reviewed following labs and imaging studies:  CBC: Recent Labs  Lab 09/16/17 0326 09/18/17 0605 09/19/17 0734  WBC 14.6* 9.5 9.2  NEUTROABS 11.1* 6.7 6.3  HGB 12.4* 10.6* 9.9*  HCT 36.0*  32.8* 30.5*  MCV 85.1 87.0 87.6  PLT 334 309 336   Basic Metabolic Panel: Recent Labs  Lab 09/16/17 0326 09/18/17 0605  NA 127* 132*  K 4.3 3.7  CL 90* 99*  CO2 23 25  GLUCOSE 391* 194*  BUN 12 16  CREATININE 0.96 1.36*  CALCIUM 9.0 8.5*  MG  --  2.2   GFR: Estimated Creatinine Clearance: 107 mL/min (A) (by C-G formula based on SCr of 1.36 mg/dL (H)). Liver Function Tests: No results for input(s): AST, ALT, ALKPHOS, BILITOT, PROT, ALBUMIN in the last 168 hours. No results for input(s): LIPASE, AMYLASE in the last 168 hours. No results for input(s): AMMONIA in the last 168 hours. Coagulation Profile: No results for input(s): INR, PROTIME in the last 168 hours. Cardiac Enzymes: No results for input(s): CKTOTAL, CKMB, CKMBINDEX, TROPONINI in the last 168 hours. BNP (last 3 results) No results for input(s): PROBNP in the last 8760 hours. HbA1C: No results for input(s): HGBA1C in the last 72 hours. CBG: Recent Labs  Lab 09/18/17 1422 09/18/17 1558 09/18/17 1702 09/18/17 2114 09/19/17 0730  GLUCAP 99 102* 112* 129* 152*   Lipid Profile: No results for input(s): CHOL, HDL, LDLCALC, TRIG, CHOLHDL, LDLDIRECT in the last 72 hours. Thyroid Function Tests: No results for input(s): TSH, T4TOTAL, FREET4, T3FREE, THYROIDAB in the last 72 hours. Anemia Panel: No results for input(s): VITAMINB12, FOLATE, FERRITIN, TIBC, IRON, RETICCTPCT in the last 72 hours. Urine analysis:    Component Value Date/Time   COLORURINE YELLOW 09/16/2017 0249   APPEARANCEUR CLEAR 09/16/2017 0249   LABSPEC 1.031 (H) 09/16/2017 0249   PHURINE 6.0 09/16/2017 0249   GLUCOSEU >=500 (A) 09/16/2017 0249   HGBUR MODERATE (A) 09/16/2017 0249   BILIRUBINUR NEGATIVE 09/16/2017 0249   KETONESUR 20 (A) 09/16/2017 0249   PROTEINUR NEGATIVE 09/16/2017 0249   UROBILINOGEN 1.0 12/18/2014 0533   NITRITE NEGATIVE 09/16/2017 0249   LEUKOCYTESUR NEGATIVE 09/16/2017 0249   Sepsis  Labs: @LABRCNTIP (procalcitonin:4,lacticacidven:4)  ) Recent Results (from the past 240 hour(s))  Blood culture (routine x 2)     Status: None (Preliminary result)   Collection Time: 09/16/17  2:54 AM  Result Value Ref Range Status   Specimen Description   Final    RIGHT ANTECUBITAL Performed at [facility], [address] [place], [place] [address]    Special Requests   Final    BOTTLES DRAWN AEROBIC AND ANAEROBIC Blood Culture adequate volume Performed at [facility], [address] [place], [place] [address]    Culture   Final    NO GROWTH 2 DAYS Performed at [facility], [address] [place], [place] [address]    Report Status PENDING  Incomplete  Blood culture (routine x 2)     Status: None (Preliminary result)   Collection Time: 09/16/17  3:27 AM  Result Value Ref Range Status   Specimen Description   Final    RIGHT ANTECUBITAL  Performed at [facility], [address], [place], [place] [address]    Special Requests   Final    BOTTLES DRAWN AEROBIC AND ANAEROBIC Blood Culture adequate volume Performed at [facility], [address], [place], [place] [address]    Culture   Final    NO GROWTH 2 DAYS Performed at [facility], [address] [place], [place] [address]    Report Status PENDING  Incomplete  Surgical pcr screen     Status: None   Collection Time: 09/18/17  1:28 PM  Result Value Ref Range Status   MRSA, PCR NEGATIVE NEGATIVE Final   Staphylococcus aureus NEGATIVE NEGATIVE Final    Comment: (NOTE) The Xpert SA Assay (FDA approved for NASAL specimens in patients 54 years of age and older), is one component of a comprehensive surveillance program. It is not intended to diagnose infection nor to guide or monitor treatment. Performed at [facility], [address] [place], [place] [address]   Aerobic/Anaerobic Culture (surgical/deep wound)     Status:  None (Preliminary result)   Collection Time: 09/18/17  3:18 PM  Result Value Ref Range Status   Specimen Description ABSCESS TOE 5TH  Final   Special Requests NONE  Final   Gram Stain   Final    ABUNDANT WBC PRESENT,BOTH PMN AND MONONUCLEAR RARE GRAM POSITIVE COCCI RARE GRAM VARIABLE ROD Performed at [facility], [address], [place], [place] [address]    Culture   Final    NO ANAEROBES ISOLATED; CULTURE IN PROGRESS FOR 5 DAYS   Report Status PENDING  Incomplete         Radiology Studies: No results found.      Scheduled Meds: . docusate sodium  100 mg Oral BID  . insulin aspart  0-20 Units Subcutaneous TID WC  . insulin aspart  0-5 Units Subcutaneous QHS  . insulin aspart protamine- aspart  40 Units Subcutaneous BID WC  . nutrition supplement (JUVEN)  1 packet Oral BID BM   Continuous Infusions: . sodium chloride 10 mL/hr at 09/19/17 0615  . lactated ringers 10 mL/hr at 09/18/17 1443  . methocarbamol (ROBAXIN)  IV    . piperacillin-tazobactam (ZOSYN)  IV 3.375 g (09/19/17 0359)  . vancomycin Stopped (09/19/17 1308)     LOS: 3 days    Time spent: 25 minutes    Alberteen Sam, MD [facility] 09/19/2017, 8:24 AM     Pager [phone] --- please page though AMION:  [email or web address] Password [email or web address] If 7PM-7AM, please contact night-coverage

## 2017-09-19 NOTE — Care Management Note (Signed)
Case Management Note  Patient Details  Name: Karren BurlyMichael Runner MRN: [id number] Date of Birth: 02/05/1981  Subjective/Objective:                    Action/Plan: See previous note. Patient has Praveena wound VAC . Will await PT recommendations.     Expected Discharge Date:  (unknown)               Expected Discharge Plan:  Home/Self Care  In-House Referral:  Nutrition, Financial Counselor  Discharge planning Services  CM Consult, Indigent Health Clinic  Post Acute Care Choice:  NA Choice offered to:  Patient  DME Arranged:  N/A DME Agency:  NA  HH Arranged:  NA HH Agency:  NA  Status of Service:  Completed, signed off  If discussed at Long Length of Stay Meetings, dates discussed:    Additional Comments:  Kingsley PlanWile, Hilja Kintzel Marie, RN 09/19/2017, 11:40 AM

## 2017-09-19 NOTE — Evaluation (Signed)
Physical Therapy Evaluation Patient Details Name: Ronnie Shaw MRN: [id number] DOB: February 04, 1981 Today's Date: 09/19/2017   History of Present Illness  Pt is a 37 y/o male s/p R 5th ray amputation. PMH including but not limited to DM and Sarcoidosis.  Clinical Impression  Pt presented supine in bed with HOB elevated, awake and willing to participate in therapy session. Prior to admission, pt reported that he was independent with all functional mobility and ADLs. Pt currently requires supervision for bed mobility, transfers with min guard and ambulated a short distance within his room with RW and min guard for safety. Pt able to maintain NWB R LE throughout independently. PT will continue to follow acutely to progress mobility as tolerated.    Follow Up Recommendations Supervision/Assistance - 24 hour    Equipment Recommendations  Rolling walker with 5" wheels    Recommendations for Other Services       Precautions / Restrictions Precautions Precautions: Fall Precaution Comments: wound VAC Restrictions Weight Bearing Restrictions: Yes RLE Weight Bearing: Non weight bearing      Mobility  Bed Mobility Overal bed mobility: Needs Assistance Bed Mobility: Supine to Sit     Supine to sit: Supervision     General bed mobility comments: supervision for safety, increased time and effort  Transfers Overall transfer level: Needs assistance Equipment used: Rolling walker (2 wheeled) Transfers: Sit to/from Stand Sit to Stand: Min guard         General transfer comment: min guard and cueing for safety  Ambulation/Gait Ambulation/Gait assistance: Min guard Ambulation Distance (Feet): 20 Feet Assistive device: Rolling walker (2 wheeled) Gait Pattern/deviations: (hop-to on L LE) Gait velocity: decreased Gait velocity interpretation: <1.8 ft/sec, indicate of risk for recurrent falls General Gait Details: pt steady with RW, ambulated from bed to toilet in bathroom  Stairs            Wheelchair Mobility    Modified Rankin (Stroke Patients Only)       Balance Overall balance assessment: Needs assistance Sitting-balance support: No upper extremity supported Sitting balance-Leahy Scale: Fair     Standing balance support: During functional activity;Bilateral upper extremity supported Standing balance-Leahy Scale: Poor                               Pertinent Vitals/Pain Pain Assessment: No/denies pain    Home Living Family/patient expects to be discharged to:: Private residence Living Arrangements: Other relatives Available Help at Discharge: Family;Friend(s);Available 24 hours/day   Home Access: Level entry     Home Layout: One level Home Equipment: None      Prior Function Level of Independence: Independent               Hand Dominance        Extremity/Trunk Assessment   Upper Extremity Assessment Upper Extremity Assessment: Overall WFL for tasks assessed    Lower Extremity Assessment Lower Extremity Assessment: Overall WFL for tasks assessed;RLE deficits/detail RLE Deficits / Details: able to maintain NWB R LE independently throughout    Cervical / Trunk Assessment Cervical / Trunk Assessment: Normal  Communication   Communication: No difficulties  Cognition Arousal/Alertness: Awake/alert Behavior During Therapy: WFL for tasks assessed/performed;Impulsive Overall Cognitive Status: Within Functional Limits for tasks assessed  General Comments      Exercises     Assessment/Plan    PT Assessment Patient needs continued PT services  PT Problem List Decreased balance;Decreased mobility;Decreased coordination;Decreased knowledge of use of DME;Decreased safety awareness;Decreased knowledge of precautions       PT Treatment Interventions DME instruction;Gait training;Stair training;Functional mobility training;Therapeutic exercise;Therapeutic  activities;Balance training;Neuromuscular re-education;Patient/family education    PT Goals (Current goals can be found in the Care Plan section)  Acute Rehab PT Goals Patient Stated Goal: return home  PT Goal Formulation: With patient Time For Goal Achievement: 10/03/17 Potential to Achieve Goals: Good    Frequency Min 5X/week   Barriers to discharge        Co-evaluation               AM-PAC PT "6 Clicks" Daily Activity  Outcome Measure Difficulty turning over in bed (including adjusting bedclothes, sheets and blankets)?: None Difficulty moving from lying on back to sitting on the side of the bed? : None Difficulty sitting down on and standing up from a chair with arms (e.g., wheelchair, bedside commode, etc,.)?: Unable Help needed moving to and from a bed to chair (including a wheelchair)?: A Little Help needed walking in hospital room?: A Little Help needed climbing 3-5 steps with a railing? : A Little 6 Click Score: 18    End of Session Equipment Utilized During Treatment: (pt refused gait belt) Activity Tolerance: Patient limited by pain;Patient limited by fatigue Patient left: with call bell/phone within reach;with family/visitor present;Other (comment)(sitting on toilet attempting to have a BM) Nurse Communication: Mobility status PT Visit Diagnosis: Other abnormalities of gait and mobility (R26.89)    Time: 1545-1600 PT Time Calculation (min) (ACUTE ONLY): 15 min   Charges:   PT Evaluation $PT Eval Moderate Complexity: 1 Mod     PT G Codes:        [place] ValleyJennifer Demone Lyles, PT, DPT [phone]   Alessandra BevelsJennifer M Luverne Farone 09/19/2017, 5:30 PM

## 2017-09-19 NOTE — Plan of Care (Signed)
  Problem: Pain Managment: Goal: General experience of comfort will improve Outcome: Progressing   Problem: Education: Goal: Knowledge of the prescribed therapeutic regimen will improve Outcome: Progressing Goal: Ability to verbalize activity precautions or restrictions will improve Outcome: Progressing   Problem: Clinical Measurements: Goal: Postoperative complications will be avoided or minimized Outcome: Progressing   Problem: Pain Management: Goal: Pain level will decrease with appropriate interventions Outcome: Progressing

## 2017-09-19 NOTE — Progress Notes (Signed)
Nutrition Follow-up  DOCUMENTATION CODES:   Obesity unspecified  INTERVENTION:   -D/c Juven -MVI with minerals daily -Glucerna Shake po TID, each supplement provides 220 kcal and 10 grams of protein  NUTRITION DIAGNOSIS:   Increased nutrient needs related to wound healing as evidenced by estimated needs.  Ongoing  GOAL:   Patient will meet greater than or equal to 90% of their needs  Progressing  MONITOR:   PO intake, Supplement acceptance, Labs, Weight trends, Skin, I & O's  REASON FOR ASSESSMENT:   Consult Diet education  ASSESSMENT:   Ronnie Shaw is a 37 y.o. M with IDDM poorly controlled and sarcoidosis who presents with foot pain, found to have osteomyelitis of the right 5th digit.    6/5- S/p PROCEDURE: RIGHT FOOT 5TH RAY AMPUTATION Local tissue rearrangement for wound closure 3 x 7 cm. With application of Praveena wound VAC  Unable to arouse pt at time of visit (pt asleep with headphones on). No family present to obtain further hx.   Noted meal completion has declined since surgery. Observed lunch tray on table- pt consumed only 25% of meal. Noted overall meal completion 20-75%.   Pt has been refusing Juven supplement.   Labs reviewed: CBGS: 102-152 (inpatient orders for glycemic control are 0-20 units insulin aspart TID with meals, 0-5 units insulin aspart q HS, and 40 units insulin aspart protamine-aspart BID).   NUTRITION - FOCUSED PHYSICAL EXAM:    Most Recent Value  Orbital Region  No depletion  Upper Arm Region  No depletion  Thoracic and Lumbar Region  No depletion  Buccal Region  No depletion  Temple Region  No depletion  Clavicle Bone Region  No depletion  Clavicle and Acromion Bone Region  No depletion  Scapular Bone Region  No depletion  Dorsal Hand  No depletion  Patellar Region  No depletion  Anterior Thigh Region  No depletion  Posterior Calf Region  No depletion  Edema (RD Assessment)  Mild  Hair  Reviewed  Eyes  Reviewed   Mouth  Reviewed  Skin  Reviewed  Nails  Reviewed       Diet Order:   Diet Order           Diet Carb Modified Fluid consistency: Thin; Room service appropriate? Yes  Diet effective now          EDUCATION NEEDS:   No education needs have been identified at this time  Skin:  Skin Assessment: Skin Integrity Issues: Skin Integrity Issues:: Wound VAC Wound Vac: rt foot  Last BM:  09/17/17  Height:   Ht Readings from Last 1 Encounters:  09/16/17 6\' 1"  (1.854 m)    Weight:   Wt Readings from Last 1 Encounters:  09/16/17 285 lb 15 oz (129.7 kg)    Ideal Body Weight:  83.6 kg  BMI:  Body mass index is 37.72 kg/m.  Estimated Nutritional Needs:   Kcal:  2000-2200  Protein:  125-140 grams  Fluid:  > 2.0 L    Ronnie Shaw, RD, LDN, CDE Pager: [phone] After hours Pager: [phone]

## 2017-09-20 LAB — CBC WITH DIFFERENTIAL/PLATELET
ABS IMMATURE GRANULOCYTES: 0 10*3/uL (ref 0.0–0.1)
BASOS PCT: 0 %
Basophils Absolute: 0 10*3/uL (ref 0.0–0.1)
Eosinophils Absolute: 0.1 10*3/uL (ref 0.0–0.7)
Eosinophils Relative: 1 %
HCT: 31.1 % — ABNORMAL LOW (ref 39.0–52.0)
Hemoglobin: 10.1 g/dL — ABNORMAL LOW (ref 13.0–17.0)
IMMATURE GRANULOCYTES: 0 %
LYMPHS ABS: 1.8 10*3/uL (ref 0.7–4.0)
Lymphocytes Relative: 21 %
MCH: 28.3 pg (ref 26.0–34.0)
MCHC: 32.5 g/dL (ref 30.0–36.0)
MCV: 87.1 fL (ref 78.0–100.0)
MONOS PCT: 9 %
Monocytes Absolute: 0.8 10*3/uL (ref 0.1–1.0)
NEUTROS ABS: 5.8 10*3/uL (ref 1.7–7.7)
NEUTROS PCT: 69 %
PLATELETS: 394 10*3/uL (ref 150–400)
RBC: 3.57 MIL/uL — ABNORMAL LOW (ref 4.22–5.81)
RDW: 12.6 % (ref 11.5–15.5)
WBC: 8.6 10*3/uL (ref 4.0–10.5)

## 2017-09-20 LAB — BASIC METABOLIC PANEL
Anion gap: 7 (ref 5–15)
BUN: 10 mg/dL (ref 6–20)
CO2: 27 mmol/L (ref 22–32)
Calcium: 8.7 mg/dL — ABNORMAL LOW (ref 8.9–10.3)
Chloride: 100 mmol/L — ABNORMAL LOW (ref 101–111)
Creatinine, Ser: 1.46 mg/dL — ABNORMAL HIGH (ref 0.61–1.24)
GFR calc Af Amer: >60 mL/min (ref >60)
GLUCOSE: 114 mg/dL — AB (ref 65–99)
POTASSIUM: 4 mmol/L (ref 3.5–5.1)
Sodium: 134 mmol/L — ABNORMAL LOW (ref 135–145)

## 2017-09-20 LAB — URINALYSIS, ROUTINE W REFLEX MICROSCOPIC
BILIRUBIN URINE: NEGATIVE
Glucose, UA: NEGATIVE mg/dL
Ketones, ur: NEGATIVE mg/dL
NITRITE: NEGATIVE
PH: 6 (ref 5.0–8.0)
Protein, ur: NEGATIVE mg/dL
SPECIFIC GRAVITY, URINE: 1.005 (ref 1.005–1.030)

## 2017-09-20 LAB — CREATININE, URINE, RANDOM: Creatinine, Urine: 49.96 mg/dL

## 2017-09-20 LAB — GLUCOSE, CAPILLARY
GLUCOSE-CAPILLARY: 149 mg/dL — AB (ref 65–99)
GLUCOSE-CAPILLARY: 120 mg/dL — AB (ref 65–99)
GLUCOSE-CAPILLARY: 146 mg/dL — AB (ref 65–99)
Glucose-Capillary: 161 mg/dL — ABNORMAL HIGH (ref 65–99)

## 2017-09-20 LAB — SODIUM, URINE, RANDOM: SODIUM UR: 24 mmol/L

## 2017-09-20 LAB — VANCOMYCIN, TROUGH: Vancomycin Tr: 14 ug/mL — ABNORMAL LOW (ref 15–20)

## 2017-09-20 MED ORDER — SODIUM CHLORIDE 0.9 % IV BOLUS
1000.0000 mL | Freq: Once | INTRAVENOUS | Status: AC
  Administered 2017-09-20: 1000 mL via INTRAVENOUS

## 2017-09-20 MED ORDER — SODIUM CHLORIDE 0.9 % IV SOLN
2.0000 g | INTRAVENOUS | Status: DC
  Administered 2017-09-20 – 2017-09-21 (×2): 2 g via INTRAVENOUS
  Filled 2017-09-20 (×2): qty 20

## 2017-09-20 MED ORDER — METRONIDAZOLE IN NACL 5-0.79 MG/ML-% IV SOLN
500.0000 mg | Freq: Three times a day (TID) | INTRAVENOUS | Status: DC
  Administered 2017-09-20 – 2017-09-21 (×4): 500 mg via INTRAVENOUS
  Filled 2017-09-20 (×4): qty 100

## 2017-09-20 NOTE — Progress Notes (Signed)
Pharmacy Antibiotic Note  Ronnie BurlyMichael Shaw is a 37 y.o. male with right foot ulceration presented to the ED on 09/16/2017 with c/o right foot pain and discharge.  MRI confirmed osteomyelitis.  Pharmacy has been consulted to dose Vancomycin and Zosyn.  Vancomycin trough this AM is 14, will not increase dose given small bump in Scr again today 1.39>>1.46   Plan: Cont vancomycin 1000 mg IV q12h Zosyn 3.375G IV q8h to be infused over 4 hours Trend WBC, temp, renal function  F/U infectious work-up Drug levels as indicated   Height: 6\' 1"  (185.4 cm) Weight: 285 lb 15 oz (129.7 kg) IBW/kg (Calculated) : 79.9  Temp (24hrs), Avg:98.1 F (36.7 C), Min:97.7 F (36.5 C), Max:98.3 F (36.8 C)  Recent Labs  Lab 09/16/17 0326 09/16/17 0337 09/18/17 0605 09/19/17 0734 09/20/17 0533  WBC 14.6*  --  9.5 9.2 8.6  CREATININE 0.96  --  1.36* 1.39* 1.46*  LATICACIDVEN  --  1.25  --   --   --   VANCOTROUGH  --   --   --   --  14*    Estimated Creatinine Clearance: 99.7 mL/min (A) (by C-G formula based on SCr of 1.46 mg/dL (H)).    Allergies  Allergen Reactions  . Prednisone Other (See Comments)    "makes me go blind", "that's how I got cataracts".    Abran DukeJames Angelino Rumery, PharmD, BCPS Clinical Pharmacist Phone: [phone]

## 2017-09-20 NOTE — Progress Notes (Signed)
Physical Therapy Treatment Patient Details Name: Ronnie Shaw MRN: [id number] DOB: [birth year] Today's Date: 09/20/2017    History of Present Illness Pt is a 37 y/o male s/p R 5th ray amputation. PMH including but not limited to DM and Sarcoidosis.    PT Comments    Pt making steady progress with functional mobility but limited with ambulation secondary to needing to have a BM. Pt reported plan is for him to d/c home on Sunday. Plan to practice ascending/descending one step at next session if appropriate.    Follow Up Recommendations  Supervision/Assistance - 24 hour     Equipment Recommendations  Rolling walker with 5" wheels    Recommendations for Other Services       Precautions / Restrictions Precautions Precautions: Fall Precaution Comments: wound VAC Restrictions Weight Bearing Restrictions: Yes RLE Weight Bearing: Non weight bearing    Mobility  Bed Mobility Overal bed mobility: Needs Assistance Bed Mobility: Supine to Sit     Supine to sit: Supervision     General bed mobility comments: supervision for safety, increased time and effort  Transfers Overall transfer level: Needs assistance Equipment used: Rolling walker (2 wheeled) Transfers: Sit to/from Stand Sit to Stand: Min guard         General transfer comment: min guard and cueing for safety  Ambulation/Gait Ambulation/Gait assistance: Min guard Ambulation Distance (Feet): 40 Feet Assistive device: Rolling walker (2 wheeled) Gait Pattern/deviations: (hop-to on L LE) Gait velocity: decreased Gait velocity interpretation: <1.8 ft/sec, indicate of risk for recurrent falls General Gait Details: pt steady with RW, ambulated in room, limited secondary to needing to have a BM (pt was given laxative)   Stairs             Wheelchair Mobility    Modified Rankin (Stroke Patients Only)       Balance Overall balance assessment: Needs assistance Sitting-balance support: No upper extremity  supported Sitting balance-Leahy Scale: Fair     Standing balance support: During functional activity;Bilateral upper extremity supported Standing balance-Leahy Scale: Poor                              Cognition Arousal/Alertness: Awake/alert Behavior During Therapy: WFL for tasks assessed/performed;Impulsive;Flat affect Overall Cognitive Status: Within Functional Limits for tasks assessed                                        Exercises      General Comments        Pertinent Vitals/Pain Pain Assessment: No/denies pain    Home Living                      Prior Function            PT Goals (current goals can now be found in the care plan section) Acute Rehab PT Goals PT Goal Formulation: With patient Time For Goal Achievement: 10/03/17 Potential to Achieve Goals: Good Progress towards PT goals: Progressing toward goals    Frequency    Min 5X/week      PT Plan Current plan remains appropriate    Co-evaluation              AM-PAC PT "6 Clicks" Daily Activity  Outcome Measure  Difficulty turning over in bed (including adjusting bedclothes, sheets and blankets)?: None Difficulty moving from lying  on back to sitting on the side of the bed? : None Difficulty sitting down on and standing up from a chair with arms (e.g., wheelchair, bedside commode, etc,.)?: Unable Help needed moving to and from a bed to chair (including a wheelchair)?: None Help needed walking in hospital room?: A Little Help needed climbing 3-5 steps with a railing? : A Little 6 Click Score: 19    End of Session   Activity Tolerance: Patient tolerated treatment well Patient left: with call bell/phone within reach;Other (comment)(sitting on toilet attempting to have a BM) Nurse Communication: Mobility status PT Visit Diagnosis: Other abnormalities of gait and mobility (R26.89)     Time: [id number] PT Time Calculation (min) (ACUTE ONLY): 14  min  Charges:  $Gait Training: 8-22 mins                    G Codes:       Cop[place] BeachJennifer Janiel Crisostomo, [place] CarolinaPT, [place]DPT [phone]    Alessandra BevelsJennifer M Cami Delawder 09/20/2017, 3:58 PM

## 2017-09-20 NOTE — Progress Notes (Signed)
PROGRESS NOTE    Justin Meisenheimer  ZOX:[id number] DOB: 11/15/1980 DOA: 09/16/2017 PCP: Patient, No Pcp Per      Brief Narrative:  Mr. Rao is a 37 y.o. M with IDDM poorly controlled and sarcoidosis who presents with foot pain, found to have osteomyelitis of the right 5th digit.  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     Assessment & Plan:  Osteomyelitis and abscess of the right 5th ray To the OR on 09/18/17.  Abscess found.  Cultures growing mycoplasma, group B strep.  Blood cultures negative.  Renal function worsening, will stop Vancomycin and Zosyn. -Stop vancomycin, Zosyn -We will start ceftriaxone, Flagyl -Wound vac in place  -PT eval  -Continue oxycodone, acetaminophen and morphine 4 mg q3hrs for pain   AKI Baseline Cr 0.8.  Now worsening to 1.5.   -Stop  Vancomycin and Zosyn -IV fluid challenge -Check FeNA -Trend BMP  Diabetes Excellent glucose control  -Continue insulin mix -Continue SSI  -Consult nutrtion -Consult Diabetes ed  Anemia of chronic disease Slight drop in Hgb, appropriate for surgery. -Trend hemoglobin  Hematuria -Repeat UA in 6 weeks  HTN -Continue PRN hydralazine, none given so far  Hyponatremia Mild.      DVT prophylaxis: SCDs Code Status: FULL Family Communication: None present MDM and disposition Plan: Below labs and imaging reports reviewed and summarized above.   Patient was admitted with osteomyelitis, went to the OR on 6/5, found to have abscess.  He will continue 24 hours more of antibiotics, then transition home with 4 more weeks oral antibiotics.     Consultants:   Orthopedics  Procedures:   5th ray amutation 09/18/17  Antimicrobials:   Vancomycin 6/2 >>  Zosyn  6/2 >>   Subjective: Pain in his right foot is tolerable.  No fever, nausea, vomiting.  No cough, sputum, dyspnea, chest pain.  No dysuria, hematuria, decreased urine output, flank pain.  Objective: Vitals:   09/19/17 1337 09/19/17 2012  09/20/17 0559 09/20/17 1429  BP: (!) 150/100 (!) 163/90 (!) 144/103 105/82  Pulse: 79 90 87 86  Resp:  18 17 17   Temp: 98.2 F (36.8 C) 98.3 F (36.8 C) 98.3 F (36.8 C) 98.4 F (36.9 C)  TempSrc: Oral Oral  Oral  SpO2: 100% 98% 99% 100%  Weight:      Height:        Intake/Output Summary (Last 24 hours) at 09/20/2017 1554 Last data filed at 09/19/2017 1700 Gross per 24 hour  Intake 505 ml  Output 0 ml  Net 505 ml   Filed Weights   09/16/17 0228 09/16/17 0504 09/16/17 2109  Weight: 127 kg (280 lb) 127 kg (280 lb) 129.7 kg (285 lb 15 oz)    Examination: General appearance: Adult male, lying in bed, interactive, watching television. HEENT: Anicteric, conjunctive are pink, lids and lashes normal.  No nasal deformity, discharge, or epistaxis.  Lips moist, teeth normal, oropharynx moist, no oral lesions.   Skin: Skin is warm and dry without suspicious rashes or lesions.  There is no induration or swelling around the wound VAC site on his right foot. Cardiac: Regular rate and rhythm, no murmurs, no lower extremity edema. Respiratory: Respiratory rate and rhythm without rales or wheezes. Abdomen: Soft without tenderness to palpation, guarding, ascites, or hepatospleno megaly MSK: The right fifth digit has been amputated and there is a wound VAC in place. Neuro: Awake and alert, cranial nerves normal, EOMI, moves all extremities with normal strength and coordination, speech fluent.  Psych: Sensorium intact and responding to questions, attention normal, affect normal, judgment insight normal.     Data Reviewed: I have personally reviewed following labs and imaging studies:  CBC: Recent Labs  Lab 09/16/17 0326 09/18/17 0605 09/19/17 0734 09/20/17 0533  WBC 14.6* 9.5 9.2 8.6  NEUTROABS 11.1* 6.7 6.3 5.8  HGB 12.4* 10.6* 9.9* 10.1*  HCT 36.0* 32.8* 30.5* 31.1*  MCV 85.1 87.0 87.6 87.1  PLT 334 309 336 394   Basic Metabolic Panel: Recent Labs  Lab 09/16/17 0326 09/18/17 0605  09/19/17 0734 09/20/17 0533  NA 127* 132* 133* 134*  K 4.3 3.7 3.7 4.0  CL 90* 99* 99* 100*  CO2 23 25 25 27   GLUCOSE 391* 194* 148* 114*  BUN 12 16 10 10   CREATININE 0.96 1.36* 1.39* 1.46*  CALCIUM 9.0 8.5* 8.4* 8.7*  MG  --  2.2  --   --    GFR: Estimated Creatinine Clearance: 99.7 mL/min (A) (by C-G formula based on SCr of 1.46 mg/dL (H)). Liver Function Tests: No results for input(s): AST, ALT, ALKPHOS, BILITOT, PROT, ALBUMIN in the last 168 hours. No results for input(s): LIPASE, AMYLASE in the last 168 hours. No results for input(s): AMMONIA in the last 168 hours. Coagulation Profile: No results for input(s): INR, PROTIME in the last 168 hours. Cardiac Enzymes: No results for input(s): CKTOTAL, CKMB, CKMBINDEX, TROPONINI in the last 168 hours. BNP (last 3 results) No results for input(s): PROBNP in the last 8760 hours. HbA1C: No results for input(s): HGBA1C in the last 72 hours. CBG: Recent Labs  Lab 09/19/17 1202 09/19/17 1653 09/19/17 2014 09/20/17 0752 09/20/17 1228  GLUCAP 82 109* 144* 149* 120*   Lipid Profile: No results for input(s): CHOL, HDL, LDLCALC, TRIG, CHOLHDL, LDLDIRECT in the last 72 hours. Thyroid Function Tests: No results for input(s): TSH, T4TOTAL, FREET4, T3FREE, THYROIDAB in the last 72 hours. Anemia Panel: No results for input(s): VITAMINB12, FOLATE, FERRITIN, TIBC, IRON, RETICCTPCT in the last 72 hours. Urine analysis:    Component Value Date/Time   COLORURINE YELLOW 09/16/2017 0249   APPEARANCEUR CLEAR 09/16/2017 0249   LABSPEC 1.031 (H) 09/16/2017 0249   PHURINE 6.0 09/16/2017 0249   GLUCOSEU >=500 (A) 09/16/2017 0249   HGBUR MODERATE (A) 09/16/2017 0249   BILIRUBINUR NEGATIVE 09/16/2017 0249   KETONESUR 20 (A) 09/16/2017 0249   PROTEINUR NEGATIVE 09/16/2017 0249   UROBILINOGEN 1.0 12/18/2014 0533   NITRITE NEGATIVE 09/16/2017 0249   LEUKOCYTESUR NEGATIVE 09/16/2017 0249   Sepsis  Labs: @LABRCNTIP (procalcitonin:4,lacticacidven:4)  ) Recent Results (from the past 240 hour(s))  Blood culture (routine x 2)     Status: None (Preliminary result)   Collection Time: 09/16/17  2:54 AM  Result Value Ref Range Status   Specimen Description   Final    RIGHT ANTECUBITAL Performed at [facility], [address] [place], [place] [address]    Special Requests   Final    BOTTLES DRAWN AEROBIC AND ANAEROBIC Blood Culture adequate volume Performed at [facility], [address] [place], [place] [address]    Culture   Final    NO GROWTH 4 DAYS Performed at [facility], [address] [place], [place] [address]    Report Status PENDING  Incomplete  Blood culture (routine x 2)     Status: None (Preliminary result)   Collection Time: 09/16/17  3:27 AM  Result Value Ref Range Status   Specimen Description   Final  RIGHT ANTECUBITAL Performed at [facility], [address] [place], [place] [address]    Special Requests   Final    BOTTLES DRAWN AEROBIC AND ANAEROBIC Blood Culture adequate volume Performed at [facility], [address] [place], [place] [address]    Culture   Final    NO GROWTH 4 DAYS Performed at [facility], [address] [place], [place] [address]    Report Status PENDING  Incomplete  Surgical pcr screen     Status: None   Collection Time: 09/18/17  1:28 PM  Result Value Ref Range Status   MRSA, PCR NEGATIVE NEGATIVE Final   Staphylococcus aureus NEGATIVE NEGATIVE Final    Comment: (NOTE) The Xpert SA Assay (FDA approved for NASAL specimens in patients 922 years of age and older), is one component of a comprehensive surveillance program. It is not intended to diagnose infection nor to guide or monitor treatment. Performed at [facility], [address] [place], [place] [address]   Aerobic/Anaerobic Culture (surgical/deep wound)     Status:  None (Preliminary result)   Collection Time: 09/18/17  3:18 PM  Result Value Ref Range Status   Specimen Description ABSCESS TOE 5TH  Final   Special Requests NONE  Final   Gram Stain   Final    ABUNDANT WBC PRESENT,BOTH PMN AND MONONUCLEAR RARE GRAM POSITIVE COCCI RARE GRAM VARIABLE ROD Performed at [facility], [address] [place], [place] [address]    Culture   Final    MODERATE GROUP B STREP(S.AGALACTIAE)ISOLATED TESTING AGAINST S. AGALACTIAE NOT ROUTINELY PERFORMED DUE TO PREDICTABILITY OF AMP/PEN/VAN SUSCEPTIBILITY. NO ANAEROBES ISOLATED; CULTURE IN PROGRESS FOR 5 DAYS    Report Status PENDING  Incomplete         Radiology Studies: No results found.      Scheduled Meds: . docusate sodium  100 mg Oral BID  . feeding supplement (GLUCERNA SHAKE)  237 mL Oral TID BM  . insulin aspart  0-20 Units Subcutaneous TID WC  . insulin aspart  0-5 Units Subcutaneous QHS  . insulin aspart protamine- aspart  40 Units Subcutaneous BID WC  . multivitamin with minerals  1 tablet Oral Daily   Continuous Infusions: . sodium chloride 10 mL/hr at 09/19/17 0615  . cefTRIAXone (ROCEPHIN)  IV     And  . metronidazole    . methocarbamol (ROBAXIN)  IV       LOS: 4 days    Time spent: 25 minutes    Alberteen Samhristopher P Danford, MD [facility] 09/20/2017, 3:54 PM     Pager [phone] --- please page though AMION:  [email or web address] Password [email or web address] If 7PM-7AM, please contact night-coverage

## 2017-09-20 NOTE — Care Management Note (Signed)
Case Management Note  Patient Details  Name: Ronnie BurlyMichael Katayama MRN: [id number] Date of Birth: 04/23/1980  Subjective/Objective:                    Action/Plan:  PT recommending rolling walker. Patient asking about a knee scooter . Discussed both with patient . Can ask [facility] if he qualifies for rolling walker through charity or they can give him a cost .   For knee scooter he can go to a medical supply store and rent one.   [facility] he will bring rolling walker to patient's room today.  MATCH letter given and explained . Potential discharge for Sunday 09/22/17. Patient voiced understanding. Expected Discharge Date:  (unknown)               Expected Discharge Plan:  Home/Self Care  In-House Referral:  Nutrition, Financial Counselor  Discharge planning Services  CM Consult, Indigent Health Clinic, MATCH Program, Medication Assistance  Post Acute Care Choice:  NA Choice offered to:  Patient  DME Arranged:  Walker rolling DME Agency:  [facility]  HH Arranged:  NA HH Agency:  NA  Status of Service:  Completed, signed off  If discussed at Long Length of Stay Meetings, dates discussed:    Additional Comments:  Kingsley PlanWile, Zhania Shaheen Marie, RN 09/20/2017, 3:24 PM

## 2017-09-21 DIAGNOSIS — M869 Osteomyelitis, unspecified: Secondary | ICD-10-CM

## 2017-09-21 LAB — CBC
HCT: 32.4 % — ABNORMAL LOW (ref 39.0–52.0)
HEMOGLOBIN: 10.2 g/dL — AB (ref 13.0–17.0)
MCH: 28 pg (ref 26.0–34.0)
MCHC: 31.5 g/dL (ref 30.0–36.0)
MCV: 89 fL (ref 78.0–100.0)
Platelets: 416 10*3/uL — ABNORMAL HIGH (ref 150–400)
RBC: 3.64 MIL/uL — AB (ref 4.22–5.81)
RDW: 12.7 % (ref 11.5–15.5)
WBC: 9.1 10*3/uL (ref 4.0–10.5)

## 2017-09-21 LAB — CULTURE, BLOOD (ROUTINE X 2)
CULTURE: NO GROWTH
Culture: NO GROWTH
SPECIAL REQUESTS: ADEQUATE
SPECIAL REQUESTS: ADEQUATE

## 2017-09-21 LAB — BASIC METABOLIC PANEL
ANION GAP: 7 (ref 5–15)
BUN: 9 mg/dL (ref 6–20)
CALCIUM: 9.2 mg/dL (ref 8.9–10.3)
CHLORIDE: 102 mmol/L (ref 101–111)
CO2: 28 mmol/L (ref 22–32)
Creatinine, Ser: 1.3 mg/dL — ABNORMAL HIGH (ref 0.61–1.24)
GFR calc non Af Amer: >60 mL/min (ref >60)
Glucose, Bld: 145 mg/dL — ABNORMAL HIGH (ref 65–99)
Potassium: 4.4 mmol/L (ref 3.5–5.1)
SODIUM: 137 mmol/L (ref 135–145)

## 2017-09-21 LAB — GLUCOSE, CAPILLARY
GLUCOSE-CAPILLARY: 145 mg/dL — AB (ref 65–99)
Glucose-Capillary: 111 mg/dL — ABNORMAL HIGH (ref 65–99)

## 2017-09-21 MED ORDER — OXYCODONE HCL 5 MG PO TABS: 5.0000 mg | ORAL_TABLET | Freq: Four times a day (QID) | ORAL | 0 refills | Status: AC | PRN

## 2017-09-21 MED ORDER — ONDANSETRON HCL 4 MG PO TABS: 4.0000 mg | ORAL_TABLET | Freq: Every day | ORAL | 1 refills | Status: AC | PRN

## 2017-09-21 MED ORDER — AMOXICILLIN-POT CLAVULANATE 875-125 MG PO TABS: 1.0000 | ORAL_TABLET | Freq: Two times a day (BID) | ORAL | 0 refills | Status: AC

## 2017-09-21 MED ORDER — INSULIN ASPART PROT & ASPART (70-30 MIX) 100 UNIT/ML ~~LOC~~ SUSP: 40.0000 [IU] | Freq: Two times a day (BID) | SUBCUTANEOUS | 3 refills | Status: DC

## 2017-09-21 NOTE — Progress Notes (Signed)
Orthopedic Tech Progress Note Patient Details:  Ronnie Shaw 05/03/1980 [id number]  Ortho Devices Type of Ortho Device: Crutches Ortho Device/Splint Location: RLE Ortho Device/Splint Interventions: Ordered   Post Interventions Patient Tolerated: Well Instructions Provided: Care of device   [name] 09/21/2017, 1:35 PM

## 2017-09-21 NOTE — Discharge Planning (Addendum)
Patient IV removed.  RN assessment and VS revealed stability for DC to home with [facility] [place] in place .  Discharge papers given, explained and educated.  Also discussed simple usage of [place] and how to manage safely at home.  Patient has FU appt set for Dr. Lajoyce Cornersuda to re-assess continual [facility] or [facility].  Script for Novolog 70/30 given and other scripts e-scribed to [facility] in [place], [place]. Patient also received afternoon dose of IV anbx x2, prior to DC.  Patient [facility] [place] to get [employer] glucometer, strips and lancets. - Patient agreed. Once ready, will be wheeled to front and family transporting home via car.

## 2017-09-21 NOTE — Progress Notes (Signed)
Physical Therapy Treatment Patient Details Name: Ronnie BurlyMichael Mcluckie MRN: [id number] DOB: 04/16/1980 Today's Date: 09/21/2017    History of Present Illness Pt is a 37 y/o male s/p R 5th ray amputation. PMH including but not limited to DM and Sarcoidosis.    PT Comments    Pt with improved ambulation distance with RW. Trialed crutches, pt demonstrates safe stair negotiation. Pt with increased difficulty with ambulation due to wound vac cord at R foot and increased trip hazard.  Recommended using RW immediately until wound vac removed then transition to crutches. Can use crutches on step to enter house. Acute PT to continue to follow.   Follow Up Recommendations  No PT follow up;Supervision/Assistance - 24 hour     Equipment Recommendations  Rolling walker with 5" wheels;Crutches    Recommendations for Other Services       Precautions / Restrictions Precautions Precautions: Fall Precaution Comments: R food wound vac Restrictions Weight Bearing Restrictions: Yes RLE Weight Bearing: Non weight bearing    Mobility  Bed Mobility Overal bed mobility: Modified Independent Bed Mobility: Supine to Sit     Supine to sit: Modified independent (Device/Increase time)     General bed mobility comments: only assist for wound vac cord otherwise able to complete on own  Transfers Overall transfer level: Needs assistance Equipment used: Crutches Transfers: Sit to/from Stand Sit to Stand: Min assist         General transfer comment: minA to steady pt, pt used rocking motion, able to maintain R LE NWB, pt does better when pushing up from arm rests  Ambulation/Gait Ambulation/Gait assistance: Min guard;Supervision Ambulation Distance (Feet): 40 Feet(x1 with crutches, 50x1 with RW) Assistive device: Rolling walker (2 wheeled);Crutches Gait Pattern/deviations: Step-to pattern;Decreased stride length Gait velocity: dec Gait velocity interpretation: <1.31 ft/sec, indicative of household  ambulator General Gait Details: pt wanted to try crutches since he was used to them from prior injury. Pt fatigued quicker with crutches and had more difficulty not getting hung up in the wound vac cord. Pt more efficient and safe with RW.    Stairs Stairs: Yes Stairs assistance: Min guard Stair Management: No rails;Step to pattern;With crutches Number of Stairs: 1(to mimic home set up) General stair comments: used crutches since that's what he's used to, pt able to demonstrate safe technique   Wheelchair Mobility    Modified Rankin (Stroke Patients Only)       Balance Overall balance assessment: Needs assistance Sitting-balance support: No upper extremity supported Sitting balance-Leahy Scale: Good     Standing balance support: During functional activity;Bilateral upper extremity supported Standing balance-Leahy Scale: Poor Standing balance comment: needs AD due to R LE NWB                            Cognition Arousal/Alertness: Awake/alert Behavior During Therapy: WFL for tasks assessed/performed Overall Cognitive Status: Within Functional Limits for tasks assessed                                        Exercises      General Comments General comments (skin integrity, edema, etc.): wound vac in place      Pertinent Vitals/Pain Pain Assessment: No/denies pain    Home Living                      Prior Function  PT Goals (current goals can now be found in the care plan section) Acute Rehab PT Goals Patient Stated Goal: return home Progress towards PT goals: Progressing toward goals    Frequency    Min 5X/week      PT Plan Current plan remains appropriate    Co-evaluation              AM-PAC PT "6 Clicks" Daily Activity  Outcome Measure  Difficulty turning over in bed (including adjusting bedclothes, sheets and blankets)?: None Difficulty moving from lying on back to sitting on the side of the  bed? : None Difficulty sitting down on and standing up from a chair with arms (e.g., wheelchair, bedside commode, etc,.)?: A Little Help needed moving to and from a bed to chair (including a wheelchair)?: A Little Help needed walking in hospital room?: A Little Help needed climbing 3-5 steps with a railing? : A Little 6 Click Score: 20    End of Session Equipment Utilized During Treatment: Gait belt Activity Tolerance: Patient tolerated treatment well Patient left: in chair;with call bell/phone within reach Nurse Communication: Mobility status PT Visit Diagnosis: Unsteadiness on feet (R26.81)     Time: 1610-9604 PT Time Calculation (min) (ACUTE ONLY): 35 min  Charges:  $Gait Training: 23-37 mins                    G Codes:       Lewis Shock, PT, DPT Pager #: [phone] Office #: [phone]    Roma Bierlein M Lillyth Spong 09/21/2017, 12:10 PM

## 2017-09-21 NOTE — Discharge Summary (Signed)
Physician Discharge Summary  Kejuan Bekker XLK:[id number] DOB: [birth year] DOA: 09/16/2017  PCP: Patient, No Pcp Per  Admit date: 09/16/2017 Discharge date: 09/21/2017  Admitted From: Home  Disposition:  Home   Recommendations for Outpatient Follow-up:  1. Follow up with PCP in 1 week 2. Please obtain BMP in one week to re-evaluate creatinine 3. Please repeat UA in 6 weeks to re-eval for microscopic hematuria 4. Please follow up with Orthopedics in 1 week  Home Health: None  Equipment/Devices: Wheeled walker, wound vac  Discharge Condition: Good  CODE STATUS: FULL Diet recommendation: Diabetic  Brief/Interim Summary: Mr. Gilreath is a 37 y.o. M with IDDM poorly controlled and sarcoidosis who presents with foot pain, found to have osteomyelitis of the right 5th digit.       Diabetic foot infection Osteomyelitis and abscess of the right 5th ray Started on vancomycin and Zosyn empirically.  MRI obtained 6/3 showed "Osteomyelitis of the head of the fifth metatarsal and the base of the proximal phalanx of the little toe with adjacent cellulitis."  To the OR on 09/18/17.  Abscess found.  Cultures grew group B strep.  Blood cultures negative.     Will have Prevena wound vac, to be removed by Dr. Sharol Given in 1 week.  Completed 72 hours post-op antibiotics, then will have 4 weeks oral antibiotics.      AKI Baseline Cr 0.8.  Worsened to 1.5 mg/dL in the hospital.  Vancomycin and Zosyn stopped, fluids given, FeNA 0.6%, improved with fluids. -Repeat BMP in 1 week   Diabetes Excellent glucose control with 70/30 insulin twice daily.    Anemia of chronic disease Slight drop in Hgb, appropriate for surgery.  Hematuria Incidental finding.   -Repeat UA in 6 weeks          Discharge Diagnoses:  Principal Problem:   Osteomyelitis of right foot ([place]) Active Problems:   Diabetes mellitus type 2 in obese (HCC)   Normochromic normocytic anemia   Hyponatremia   Osteomyelitis  (HCC)   Osteomyelitis of foot, right, acute (HCC)   Diabetic polyneuropathy associated with type 2 diabetes mellitus [facility])    Discharge Instructions  Discharge Instructions    Diet - low sodium heart healthy   Complete by:  As directed    Discharge instructions   Complete by:  As directed    From Dr. Loleta Books: You were admitted for an infection and abscess in your right pinkie toe.  This had spread into the bone (bone infection is called "osteomyelitis"), and the only treatment was antibiotics and to cut it out.  Because of the large pus pocket (abscess) that Dr. Sharol Given found, we would like you to take antibiotic pills for a month after you leave the hospital.  Take Augmentin 875-125 mg twice daily for the next four weeks. Antibiotics can cause diarrhea, so consider taking a probiotic pill daily.  Ask the pharmacist to direct you where in the pharmacy the probiotics are kept.  It doesn't matter which one you take (acidophilus, lactobacillus, etc etc).  Just take 1 or 2 pills per day until you are done with the antibiotics.  Dispose of all unused oxycodone when you are done. While you are taking oxycodone, make sure you take Miralax probably once a day or every other day at least. Take acetaminophen 1000 mg three times a day for the next week (this will help oxycodone work better, and help you need less oxycodone), slowly reduce that as you stop needing the oxycodone.  Follow up with the [facility]:  You have an appointment there on Thursday Jun 13 at [facility] They are located at [facility] in [place]  The other clinic with a sliding scale that you should look into is: [facility]  [phone] --- [place], [address]. Jess Barters office on Monday at [phone] Double check with them the date and time of your appointment with Dr. Sharol Given Their office is located at [place] which is very close to the [facility] where you have your appointment at [place].   Increase activity slowly   Complete by:  As directed      Allergies as of 09/21/2017      Reactions   Prednisone Other (See Comments)   "makes me go blind", "that's how I got cataracts".      Medication List    STOP taking these medications   TRUE METRIX METER w/Device Kit     TAKE these medications   amoxicillin-clavulanate 875-125 MG tablet Commonly known as:  AUGMENTIN Take 1 tablet by mouth 2 (two) times daily for 28 days.   insulin aspart protamine- aspart (70-30) 100 UNIT/ML injection Commonly known as:  NOVOLOG MIX 70/30 Inject 0.4 mLs (40 Units total) into the skin 2 (two) times daily with a meal. What changed:  how much to take   ondansetron 4 MG tablet Commonly known as:  ZOFRAN Take 1 tablet (4 mg total) by mouth daily as needed for nausea or vomiting.   oxyCODONE 5 MG immediate release tablet Commonly known as:  Oxy IR/ROXICODONE Take 1 tablet (5 mg total) by mouth every 6 (six) hours as needed for up to 4 days for moderate pain, severe pain or breakthrough pain.            Durable Medical Equipment  (From admission, onward)        Start     Ordered   09/20/17 1524  For home use only DME Walker rolling  Once    Question:  Patient needs a walker to treat with the following condition  Answer:  Osteomyelitis ([place])   09/20/17 1523     Follow-up [place] Follow up.   Why:  September 26, 2017 at 9:10 am  Contact information: [place] [phone] [phone]       Newt Minion, MD In 2 weeks.   Specialty:  Orthopedic Surgery Contact information: [address] [place] [address] [phone]          Allergies  Allergen Reactions  . Prednisone Other (See Comments)    "makes me go blind", "that's how I got cataracts".     Consultations:  Orthopedics   Procedures/Studies: Mr Foot Right Wo Contrast  Result Date: 09/16/2017 CLINICAL DATA:  Soft tissue ulceration of the right forefoot at the fifth MTP joint. Swelling. EXAM: MRI OF THE RIGHT FOREFOOT WITHOUT CONTRAST TECHNIQUE: Multiplanar, multisequence MR imaging of the right forefoot was performed. No intravenous contrast was administered. COMPARISON:  Radiographs dated 09/16/2017 FINDINGS: Bones/Joint/Cartilage There is abnormal edema in the head of the fifth metatarsal and the base of the proximal phalanx of the little toe with adjacent soft tissue edema and soft tissue ulceration at the dorsal lateral aspect of the joint. Physiologic amount of joint fluid. No discrete cortical  destruction. The other bones of the forefoot are normal. Muscles and Tendons Normal. Soft tissues Soft tissue edema around the fifth MTP joint consistent with cellulitis. Focal 15 mm soft tissue ulceration at the plantar lateral aspect of fifth MTP joint. IMPRESSION: Osteomyelitis of the head of the fifth metatarsal and the base of the proximal phalanx of the little toe with adjacent cellulitis. Electronically Signed   By: Lorriane Shire M.D.   On: 09/16/2017 09:08   Dg Foot Complete Right  Result Date: 09/16/2017 CLINICAL DATA:  Nonhealing diabetic foot wound.  Infection. EXAM: RIGHT FOOT COMPLETE - 3+ VIEW COMPARISON:  None. FINDINGS: Ill-defined air in the soft tissues lateral to the fifth metatarsal phalangeal joint. Slight decreased density of the adjacent fifth metatarsal head. No frank bony destructive change. Truncation of the third digit distal phalanx with possible osseous fragment distally. Accessory ossicle versus sequela of remote prior injury but the medial cuneiform-metatarsal articulation. Diffuse dorsal soft tissue edema. No radiopaque foreign body. IMPRESSION: 1. Air in the soft tissues lateral to the fifth metatarsal phalangeal joint likely site of infection/wound. Slight  decreased density of the subjacent fifth metatarsal head suggests early osteomyelitis. 2. Abnormal appearance of the third digit distal phalanx with truncation, is likely chronic and may be postsurgical, recommend correlation with surgical history. Electronically Signed   By: Jeb Levering M.D.   On: 09/16/2017 03:45       Subjective: Feeling well.  Foot pain better.  Appetite good.  Sugars good.  No nausea.  No fever, chills.  Eager to go home.  Discharge Exam: Vitals:   09/21/17 0537 09/21/17 1404  BP: (!) 141/84 (!) 159/98  Pulse: 79 85  Resp: 18   Temp: 98.6 F (37 C) (!) 97.5 F (36.4 C)  SpO2: 100% 100%   Vitals:   09/20/17 1429 09/20/17 2211 09/21/17 0537 09/21/17 1404  BP: 105/82 (!) 140/102 (!) 141/84 (!) 159/98  Pulse: 86 93 79 85  Resp: '17 18 18   ' Temp: 98.4 F (36.9 C) 97.8 F (36.6 C) 98.6 F (37 C) (!) 97.5 F (36.4 C)  TempSrc: Oral Axillary Oral Oral  SpO2: 100% 100% 100% 100%  Weight:      Height:        General: Pt is alert, awake, not in acute distress Cardiovascular: RRR, S1/S2 +, no rubs, no gallops Respiratory: CTA bilaterally, no wheezing, no rhonchi Abdominal: Soft, NT, ND, bowel sounds + Extremities: no edema, no cyanosis, right foot with wound vac in place    The results of significant diagnostics from this hospitalization (including imaging, microbiology, ancillary and laboratory) are listed below for reference.     Microbiology: Recent Results (from the past 240 hour(s))  Blood culture (routine x 2)     Status: None   Collection Time: 09/16/17  2:54 AM  Result Value Ref Range Status   Specimen Description   Final    RIGHT ANTECUBITAL Performed at [facility] [address], [place], [place] [address]    Special Requests   Final    BOTTLES DRAWN AEROBIC AND ANAEROBIC Blood Culture adequate volume Performed at [facility] [address], [place], [place] [address]    Culture   Final     NO GROWTH 5 DAYS Performed at [facility], [place] [address] [place], [place] [address]    Report Status 09/21/2017 FINAL  Final  Blood culture (routine x 2)     Status: None   Collection  Time: 09/16/17  3:27 AM  Result Value Ref Range Status   Specimen Description   Final    RIGHT ANTECUBITAL Performed at [facility] [address], [place], [place] [address]    Special Requests   Final    BOTTLES DRAWN AEROBIC AND ANAEROBIC Blood Culture adequate volume Performed at [facility] [address], [place], [place] [address]    Culture   Final    NO GROWTH 5 DAYS Performed at [facility], [place] [address], [place], [place] [address]    Report Status 09/21/2017 FINAL  Final  Surgical pcr screen     Status: None   Collection Time: 09/18/17  1:28 PM  Result Value Ref Range Status   MRSA, PCR NEGATIVE NEGATIVE Final   Staphylococcus aureus NEGATIVE NEGATIVE Final    Comment: (NOTE) The Xpert SA Assay (FDA approved for NASAL specimens in patients 26 years of age and older), is one component of a comprehensive surveillance program. It is not intended to diagnose infection nor to guide or monitor treatment. Performed at [facility], [place] [address] [place], [place] [address]   Aerobic/Anaerobic Culture (surgical/deep wound)     Status: None (Preliminary result)   Collection Time: 09/18/17  3:18 PM  Result Value Ref Range Status   Specimen Description ABSCESS TOE 5TH  Final   Special Requests NONE  Final   Gram Stain   Final    ABUNDANT WBC PRESENT,BOTH PMN AND MONONUCLEAR RARE GRAM POSITIVE COCCI RARE GRAM VARIABLE ROD Performed at [facility], [address], [place], [place] [address]    Culture   Final    MODERATE GROUP B STREP(S.AGALACTIAE)ISOLATED TESTING AGAINST S. AGALACTIAE NOT ROUTINELY PERFORMED DUE TO PREDICTABILITY OF AMP/PEN/VAN SUSCEPTIBILITY. NO ANAEROBES ISOLATED; CULTURE IN PROGRESS FOR 5 DAYS     Report Status PENDING  Incomplete     Labs: BNP (last 3 results) No results for input(s): BNP in the last 8760 hours. Basic Metabolic Panel: Recent Labs  Lab 09/16/17 0326 09/18/17 0605 09/19/17 0734 09/20/17 0533 09/21/17 0529  NA 127* 132* 133* 134* 137  K 4.3 3.7 3.7 4.0 4.4  CL 90* 99* 99* 100* 102  CO2 '23 25 25 27 28  ' GLUCOSE 391* 194* 148* 114* 145*  BUN '12 16 10 10 9  ' CREATININE 0.96 1.36* 1.39* 1.46* 1.30*  CALCIUM 9.0 8.5* 8.4* 8.7* 9.2  MG  --  2.2  --   --   --    Liver Function Tests: No results for input(s): AST, ALT, ALKPHOS, BILITOT, PROT, ALBUMIN in the last 168 hours. No results for input(s): LIPASE, AMYLASE in the last 168 hours. No results for input(s): AMMONIA in the last 168 hours. CBC: Recent Labs  Lab 09/16/17 0326 09/18/17 0605 09/19/17 0734 09/20/17 0533 09/21/17 0529  WBC 14.6* 9.5 9.2 8.6 9.1  NEUTROABS 11.1* 6.7 6.3 5.8  --   HGB 12.4* 10.6* 9.9* 10.1* 10.2*  HCT 36.0* 32.8* 30.5* 31.1* 32.4*  MCV 85.1 87.0 87.6 87.1 89.0  PLT 334 309 336 394 416*   Cardiac Enzymes: No results for input(s): CKTOTAL, CKMB, CKMBINDEX, TROPONINI in the last 168 hours. BNP: Invalid input(s): POCBNP CBG: Recent Labs  Lab 09/20/17 1228 09/20/17 1654 09/20/17 2135 09/21/17 0745 09/21/17 1243  GLUCAP 120* 161* 146* 145* 111*   D-Dimer No results for input(s): DDIMER in the last 72 hours. Hgb A1c No results for input(s): HGBA1C in the last 72 hours.  Lipid Profile No results for input(s): CHOL, HDL, LDLCALC, TRIG, CHOLHDL, LDLDIRECT in the last 72 hours. Thyroid function studies No results for input(s): TSH, T4TOTAL, T3FREE, THYROIDAB in the last 72 hours.  Invalid input(s): FREET3 Anemia work up No results for input(s): VITAMINB12, FOLATE, FERRITIN, TIBC, IRON, RETICCTPCT in the last 72 hours. Urinalysis    Component Value Date/Time   COLORURINE STRAW (A) 09/20/2017 1607   APPEARANCEUR CLEAR 09/20/2017 1607   LABSPEC 1.005 09/20/2017 1607    PHURINE 6.0 09/20/2017 1607   GLUCOSEU NEGATIVE 09/20/2017 1607   HGBUR SMALL (A) 09/20/2017 1607   BILIRUBINUR NEGATIVE 09/20/2017 1607   KETONESUR NEGATIVE 09/20/2017 1607   PROTEINUR NEGATIVE 09/20/2017 1607   UROBILINOGEN 1.0 12/18/2014 0533   NITRITE NEGATIVE 09/20/2017 1607   LEUKOCYTESUR SMALL (A) 09/20/2017 1607   Sepsis Labs Invalid input(s): PROCALCITONIN,  WBC,  LACTICIDVEN Microbiology Recent Results (from the past 240 hour(s))  Blood culture (routine x 2)     Status: None   Collection Time: 09/16/17  2:54 AM  Result Value Ref Range Status   Specimen Description   Final    RIGHT ANTECUBITAL Performed at [facility], [place] [address] [place], [place] [address]    Special Requests   Final    BOTTLES DRAWN AEROBIC AND ANAEROBIC Blood Culture adequate volume Performed at [facility] [address], [place], [place] [address]    Culture   Final    NO GROWTH 5 DAYS Performed at [facility], [place] [address], [place], [place] [address]    Report Status 09/21/2017 FINAL  Final  Blood culture (routine x 2)     Status: None   Collection Time: 09/16/17  3:27 AM  Result Value Ref Range Status   Specimen Description   Final    RIGHT ANTECUBITAL Performed at [facility] [address], [place], [place] [address]    Special Requests   Final    BOTTLES DRAWN AEROBIC AND ANAEROBIC Blood Culture adequate volume Performed at [facility] [address] [place], [place] [address]    Culture   Final    NO GROWTH 5 DAYS Performed at [facility], [place] [address] [place], [place] [address]    Report Status 09/21/2017 FINAL  Final  Surgical pcr screen     Status: None   Collection Time: 09/18/17  1:28 PM  Result Value Ref Range Status   MRSA, PCR NEGATIVE NEGATIVE Final   Staphylococcus aureus NEGATIVE NEGATIVE Final    Comment: (NOTE) The Xpert SA Assay (FDA approved for NASAL  specimens in patients 64 years of age and older), is one component of a comprehensive surveillance program. It is not intended to diagnose infection nor to guide or monitor treatment. Performed at [facility], [place] [address], [place], [place] [address]   Aerobic/Anaerobic Culture (surgical/deep wound)     Status: None (Preliminary result)   Collection Time: 09/18/17  3:18 PM  Result Value Ref Range Status   Specimen Description ABSCESS TOE 5TH  Final   Special Requests NONE  Final   Gram Stain   Final    ABUNDANT WBC PRESENT,BOTH PMN AND MONONUCLEAR RARE GRAM POSITIVE COCCI RARE GRAM VARIABLE ROD Performed at [facility], [address], [place], [place] [address]    Culture   Final    MODERATE GROUP B STREP(S.AGALACTIAE)ISOLATED TESTING AGAINST S. AGALACTIAE NOT ROUTINELY PERFORMED DUE TO PREDICTABILITY OF AMP/PEN/VAN SUSCEPTIBILITY.  NO ANAEROBES ISOLATED; CULTURE IN PROGRESS FOR 5 DAYS    Report Status PENDING  Incomplete     Time coordinating discharge: 45 minutes The [place] controlled substances registry was reviewed for >12 months for this patient prior to filling the <5 days supply controlled substances script for surgical pain, no suspicious actiivty noted.      SIGNED:   Edwin Dada, MD  [facility] 09/21/2017, 5:27 PM

## 2017-09-25 NOTE — Progress Notes (Signed)
Patient ID: Ronnie Shaw, male   DOB: 13-Sep-1980, 37 y.o.   MRN: [id number]    Bannon Giammarco, is a 37 y.o. male  MVH:[id number]  [id number]:[id number]  DOB - [birth year]  Subjective:  Chief Complaint and HPI: Ronnie Shaw is a 37 y.o. male here today to establish care and for a follow up visit hospitalization 6/3-09/21/2017 and amputation of R 5th digit due to osteomyelitis and poorly controlled DM.  He is still taking the antibiotics.  He is doing well.  We called and got him an appt for this afternoon with Dr Lajoyce Corners.  No f/c.  Blood sugars running in the low 100s.  He is working hard to change his diet.  Admits to poor compliance and f/up in the past.    Social:  Single, aunt is with him,  Not working  From hospital discharge- Recommendations for Outpatient Follow-up:  1. Follow up with PCP in 1 week 2. Please obtain BMP in one week to re-evaluate creatinine 3. Please repeat UA in 6 weeks to re-eval for microscopic hematuria 4. Please follow up with Orthopedics in 1 week  Home Health: None  Equipment/Devices: Wheeled walker, wound vac  Discharge Condition: Good  CODE STATUS: FULL Diet recommendation: Diabetic  Brief/Interim Summary: Ronnie Shaw is a59 y.o.M with IDDM poorly controlled and sarcoidosis who presents with foot pain, found to have osteomyelitis of the right 5th digit.    Diabetic foot infection Osteomyelitis and abscess of the right 5th ray Started on vancomycin and Zosyn empirically.  MRI obtained 6/3 showed "Osteomyelitis of the head of the fifth metatarsal and the base of the proximal phalanx of the little toe with adjacent cellulitis."  To the OR on 09/18/17. Abscess found. Cultures grew group B strep.Blood cultures negative.   Will have Prevena wound vac, to be removed by Dr. Lajoyce Corners in 1 week.  Completed 72 hours post-op antibiotics, then will have 4 weeks oral antibiotics.     AKI Baseline Cr 0.8. Worsened to 1.5 mg/dL in the hospital.   Vancomycin and Zosyn stopped, fluids given, FeNA 0.6%, improved with fluids. -Repeat BMP in 1 week   Diabetes Excellent glucose controlwith 70/30 insulin twice daily.    Anemia of chronic disease Slight drop in Hgb, appropriate for surgery.  Hematuria Incidental finding.   -Repeat UA in 6 weeks    ED/Hospital notes reviewed and summarized above   ROS:   Constitutional:  No f/c, No night sweats, No unexplained weight loss. EENT:  No vision changes, No blurry vision, No hearing changes. No mouth, throat, or ear problems.  Respiratory: No cough, No SOB Cardiac: No CP, no palpitations GI:  No abd pain, No N/V/D. GU: No Urinary s/sx Musculoskeletal: +R foot pain Neuro: No headache, no dizziness, no motor weakness.  Skin: No rash Endocrine:  No polydipsia. No polyuria.  Psych: Denies SI/HI  No problems updated.  ALLERGIES: Allergies  Allergen Reactions  . Prednisone Other (See Comments)    "makes me go blind", "that's how I got cataracts".    PAST MEDICAL HISTORY: Past Medical History:  Diagnosis Date  . Cataract   . Diabetes mellitus   . Sarcoidosis     MEDICATIONS AT HOME: Prior to Admission medications   Medication Sig Start Date End Date Taking? Authorizing Provider  amoxicillin-clavulanate (AUGMENTIN) 875-125 MG tablet Take 1 tablet by mouth 2 (two) times daily for 28 days. 09/21/17 10/19/17  Danford, Earl Lites, MD  insulin aspart protamine- aspart (NOVOLOG MIX 70/30) (70-30) 100 UNIT/ML injection Inject  0.4 mLs (40 Units total) into the skin 2 (two) times daily with a meal. 09/26/17   Vernadine Coombs, Marzella SchleinAngela M, PA-C  ondansetron (ZOFRAN) 4 MG tablet Take 1 tablet (4 mg total) by mouth daily as needed for nausea or vomiting. 09/21/17 09/21/18  Alberteen Samanford, Christopher P, MD     Objective:  EXAM:   Vitals:   09/26/17 0949  BP: 115/77  Pulse: 89  Resp: 18  Temp: 98.7 F (37.1 C)  TempSrc: Oral  SpO2: 99%  Weight: 285 lb (129.3 kg)  Height: 6\' 1"  (1.854 m)     General appearance : A&OX3. NAD. Non-toxic-appearing, in a wheel chair.  Wound vac in place HEENT: Atraumatic and Normocephalic.  PERRLA. EOM intact.  TM clear B. Mouth-MMM, post pharynx WNL w/o erythema, No PND. Neck: supple, no JVD. No cervical lymphadenopathy. No thyromegaly Chest/Lungs:  Breathing-non-labored, Good air entry bilaterally, breath sounds normal without rales, rhonchi, or wheezing  CVS: S1 S2 regular, no murmurs, gallops, rubs  No erythema of R leg/foot surrounding tissue-  Dressing not removed(sees ortho today) Extremities: Bilateral Lower Ext shows no edema, both legs are warm to touch with = pulse throughout Neurology:  CN II-XII grossly intact, Non focal.   Psych:  TP linear. J/I WNL. Normal speech. Appropriate eye contact and affect.  Skin:  No Rash  Data Review Lab Results  Component Value Date   HGBA1C 13.0 (H) 09/16/2017     Assessment & Plan   1. Diabetes mellitus type 2 in obese [facility](HCC) Improving control-check suagar at meal times and record and bring to your next visit.  Continue current regimen and work on diet - Glucose (CBG).  New meter ordered - insulin aspart protamine- aspart (NOVOLOG MIX 70/30) (70-30) 100 UNIT/ML injection; Inject 0.4 mLs (40 Units total) into the skin 2 (two) times daily with a meal.  Dispense: 10 mL; Refill: 3 - Basic metabolic panel Lengthy discussion about proper diet  2. Osteomyelitis of foot, right, acute (HCC) Ortho today at 3:30  3. Hospital discharge follow-up Improving; continue antibiotics.    4. Abnormal creatinine clearance glomerular filtration - Basic metabolic panel     Patient have been counseled extensively about nutrition and exercise  Return in about 3 weeks (around 10/17/2017) [facility] and 2 months for PCP assignment..  The patient was given clear instructions to go to ER or return to medical center if symptoms don't improve, worsen or new problems develop. The patient verbalized  understanding. The patient was told to call to get lab results if they haven't heard anything in the next week.     Georgian CoAngela Rolin Schult, PA-C [facility] [place], [place] [phone]   09/26/2017, 9:56 AM

## 2017-09-26 ENCOUNTER — Ambulatory Visit: Payer: Self-pay | Attending: Family Medicine | Admitting: Physician Assistant

## 2017-09-26 ENCOUNTER — Encounter (INDEPENDENT_AMBULATORY_CARE_PROVIDER_SITE_OTHER): Payer: Self-pay | Admitting: Orthopedic Surgery

## 2017-09-26 ENCOUNTER — Ambulatory Visit (INDEPENDENT_AMBULATORY_CARE_PROVIDER_SITE_OTHER): Payer: Self-pay | Admitting: Orthopedic Surgery

## 2017-09-26 VITALS — BP 115/77 | HR 89 | Temp 98.7°F | Resp 18 | Ht 73.0 in | Wt 285.0 lb

## 2017-09-26 VITALS — Ht 73.0 in | Wt 285.0 lb

## 2017-09-26 DIAGNOSIS — Z6837 Body mass index (BMI) 37.0-37.9, adult: Secondary | ICD-10-CM | POA: Insufficient documentation

## 2017-09-26 DIAGNOSIS — E1165 Type 2 diabetes mellitus with hyperglycemia: Secondary | ICD-10-CM | POA: Insufficient documentation

## 2017-09-26 DIAGNOSIS — Z79899 Other long term (current) drug therapy: Secondary | ICD-10-CM | POA: Insufficient documentation

## 2017-09-26 DIAGNOSIS — M86171 Other acute osteomyelitis, right ankle and foot: Secondary | ICD-10-CM | POA: Insufficient documentation

## 2017-09-26 DIAGNOSIS — R319 Hematuria, unspecified: Secondary | ICD-10-CM | POA: Insufficient documentation

## 2017-09-26 DIAGNOSIS — E1169 Type 2 diabetes mellitus with other specified complication: Secondary | ICD-10-CM

## 2017-09-26 DIAGNOSIS — E11628 Type 2 diabetes mellitus with other skin complications: Secondary | ICD-10-CM | POA: Insufficient documentation

## 2017-09-26 DIAGNOSIS — D869 Sarcoidosis, unspecified: Secondary | ICD-10-CM | POA: Insufficient documentation

## 2017-09-26 DIAGNOSIS — Z794 Long term (current) use of insulin: Secondary | ICD-10-CM | POA: Insufficient documentation

## 2017-09-26 DIAGNOSIS — Z89421 Acquired absence of other right toe(s): Secondary | ICD-10-CM

## 2017-09-26 DIAGNOSIS — Z09 Encounter for follow-up examination after completed treatment for conditions other than malignant neoplasm: Secondary | ICD-10-CM | POA: Insufficient documentation

## 2017-09-26 DIAGNOSIS — N179 Acute kidney failure, unspecified: Secondary | ICD-10-CM | POA: Insufficient documentation

## 2017-09-26 DIAGNOSIS — Z792 Long term (current) use of antibiotics: Secondary | ICD-10-CM | POA: Insufficient documentation

## 2017-09-26 DIAGNOSIS — E669 Obesity, unspecified: Secondary | ICD-10-CM | POA: Insufficient documentation

## 2017-09-26 DIAGNOSIS — D638 Anemia in other chronic diseases classified elsewhere: Secondary | ICD-10-CM | POA: Insufficient documentation

## 2017-09-26 DIAGNOSIS — R944 Abnormal results of kidney function studies: Secondary | ICD-10-CM

## 2017-09-26 LAB — GLUCOSE, POCT (MANUAL RESULT ENTRY): POC Glucose: 121 mg/dl — AB (ref 70–99)

## 2017-09-26 MED ORDER — TRUE METRIX METER W/DEVICE KIT: 1.0000 | PACK | Freq: Three times a day (TID) | 0 refills | Status: DC

## 2017-09-26 MED ORDER — INSULIN ASPART PROT & ASPART (70-30 MIX) 100 UNIT/ML ~~LOC~~ SUSP: 40.0000 [IU] | Freq: Two times a day (BID) | SUBCUTANEOUS | 3 refills | Status: DC

## 2017-09-26 MED ORDER — GLUCOSE BLOOD VI STRP
ORAL_STRIP | 12 refills | Status: DC
Start: 2017-09-26 — End: 2017-11-27

## 2017-09-26 MED ORDER — TRUEPLUS LANCETS 28G MISC: 1.0000 | Freq: Three times a day (TID) | 12 refills | Status: DC

## 2017-09-26 MED FILL — !TRUE METRIX BLOOD GLUCOSE: 30 days supply | Qty: 1 | Fill #0

## 2017-09-26 MED FILL — NOVOLOG MIX 70/30 VIAL: (70-30) 100 | 24 days supply | Qty: 20 | Fill #0

## 2017-09-26 MED FILL — TRUE METRIX TEST STRIP: 30 days supply | Qty: 100 | Fill #0

## 2017-09-26 MED FILL — TRUEplus LANCETS 28G MISC: 30 days supply | Qty: 100 | Fill #0

## 2017-09-26 NOTE — Progress Notes (Signed)
Office Visit Note   Patient: Ronnie Shaw           Date of Birth: 04/12/1981           MRN: [id number] Visit Date: 09/26/2017              Requested by: No referring provider defined for this encounter. PCP: Patient, No Pcp Per  Chief Complaint  Patient presents with  . Right Foot - Routine Post Op    09/18/17 right foot 5th ray amputation       HPI: Patient is a 37 year old gentleman with diabetic insensate neuropathy status post right foot fifth ray amputation 1 week ago.  Patient states he left his postoperative shoe at the hospital.  Assessment & Plan: Visit Diagnoses:  1. 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  He is to use 4 x 4's and relieves the Ace wrap.  We will give her new postoperative shoe he is to keep the foot elevated and nonweightbearing.  Discussed risk of wound dehiscence with weightbearing.  Follow-Up Instructions: Return in about 2 weeks (around 10/10/2017).   Ortho Exam  Patient is alert, oriented, no adenopathy, well-dressed, normal affect, normal respiratory effort. Examination the incision is well approximated there is a small amount of clear serosanguineous drainage.  The wound VAC was removed.  There is some mild maceration around the surgical incision with no dehiscence no cellulitis no odor no signs of infection.  Imaging: No results found. No images are attached to the encounter.  Labs: Lab Results  Component Value Date   HGBA1C 13.0 (H) 09/16/2017   ESRSEDRATE 102 (H) 09/16/2017   REPTSTATUS PENDING 09/18/2017   GRAMSTAIN  09/18/2017    ABUNDANT WBC PRESENT,BOTH PMN AND MONONUCLEAR RARE GRAM POSITIVE COCCI RARE GRAM VARIABLE ROD    CULT  09/18/2017    MODERATE GROUP B STREP(S.AGALACTIAE)ISOLATED TESTING AGAINST S. AGALACTIAE NOT ROUTINELY PERFORMED DUE TO PREDICTABILITY OF AMP/PEN/VAN  SUSCEPTIBILITY. ABUNDANT FINEGOLDIA MAGNA CULTURE REINCUBATED FOR BETTER GROWTH Performed at [facility], [address] [place], [place] [address]      Lab Results  Component Value Date   ALBUMIN 3.8 12/18/2014   ALBUMIN 3.7 09/07/2010    Body mass index is 37.6 kg/m.  Orders:  No orders of the defined types were placed in this encounter.  No orders of the defined types were placed in this encounter.    Procedures: No procedures performed  Clinical Data: No additional findings.  ROS:  All other systems negative, except as noted in the HPI. Review of Systems  Objective: Vital Signs: Ht 6\' 1"  (1.854 m)   Wt 285 lb (129.3 kg)   BMI 37.60 kg/m   Specialty Comments:  No specialty comments available.  PMFS History: Patient Active Problem List   Diagnosis Date Noted  . 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 Diabetic polyneuropathy associated with type 2 diabetes mellitus (HCC)   . Osteomyelitis of right foot (HCC) 09/16/2017  . Diabetes mellitus type 2 in obese (HCC) 09/16/2017  . Normochromic normocytic anemia 09/16/2017  . Hyponatremia 09/16/2017  . Osteomyelitis (HCC) 09/16/2017  . Osteomyelitis of foot, right, acute (HCC) 09/16/2017   Past Medical History:  Diagnosis Date  . Cataract   . Diabetes mellitus   .  Sarcoidosis     History reviewed. No pertinent family history.  Past Surgical History:  Procedure Laterality Date  . AMPUTATION Right 09/18/2017   Procedure: RIGHT FOOT 5TH RAY AMPUTATION;  Surgeon: [name], MD;  Location: [facility] OR;  Service: Orthopedics;  Laterality: Right;  . CATARACT EXTRACTION W/ INTRAOCULAR LENS  IMPLANT, BILATERAL     Social History   Occupational History  . Not on file  Tobacco Use  . Smoking status: Former Smoker    Packs/day: 0.00  . Smokeless tobacco: Never Used  Substance and Sexual Activity  . Alcohol use: Yes    Comment: occasional  . Drug use: No  .  Sexual activity: Not on file

## 2017-09-26 NOTE — Patient Instructions (Signed)
Check blood sugars before meals and record and bring to your next visit.       Diabetes Mellitus and Nutrition When you have diabetes (diabetes mellitus), it is very important to have healthy eating habits because your blood sugar (glucose) levels are greatly affected by what you eat and drink. Eating healthy foods in the appropriate amounts, at about the same times every day, can help you:  Control your blood glucose.  Lower your risk of heart disease.  Improve your blood pressure.  Reach or maintain a healthy weight.  Every person with diabetes is different, and each person has different needs for a meal plan. Your health care provider may recommend that you work with a diet and nutrition specialist (dietitian) to make a meal plan that is best for you. Your meal plan may vary depending on factors such as:  The calories you need.  The medicines you take.  Your weight.  Your blood glucose, blood pressure, and cholesterol levels.  Your activity level.  Other health conditions you have, such as heart or kidney disease.  How do carbohydrates affect me? Carbohydrates affect your blood glucose level more than any other type of food. Eating carbohydrates naturally increases the amount of glucose in your blood. Carbohydrate counting is a method for keeping track of how many carbohydrates you eat. Counting carbohydrates is important to keep your blood glucose at a healthy level, especially if you use insulin or take certain oral diabetes medicines. It is important to know how many carbohydrates you can safely have in each meal. This is different for every person. Your dietitian can help you calculate how many carbohydrates you should have at each meal and for snack. Foods that contain carbohydrates include:  Bread, cereal, rice, pasta, and crackers.  Potatoes and corn.  Peas, beans, and lentils.  Milk and yogurt.  Fruit and juice.  Desserts, such as cakes, cookies, ice cream, and  candy.  How does alcohol affect me? Alcohol can cause a sudden decrease in blood glucose (hypoglycemia), especially if you use insulin or take certain oral diabetes medicines. Hypoglycemia can be a life-threatening condition. Symptoms of hypoglycemia (sleepiness, dizziness, and confusion) are similar to symptoms of having too much alcohol. If your health care provider says that alcohol is safe for you, follow these guidelines:  Limit alcohol intake to no more than 1 drink per day for nonpregnant women and 2 drinks per day for men. One drink equals 12 oz of beer, 5 oz of wine, or 1 oz of hard liquor.  Do not drink on an empty stomach.  Keep yourself hydrated with water, diet soda, or unsweetened iced tea.  Keep in mind that regular soda, juice, and other mixers may contain a lot of sugar and must be counted as carbohydrates.  What are tips for following this plan? Reading food labels  Start by checking the serving size on the label. The amount of calories, carbohydrates, fats, and other nutrients listed on the label are based on one serving of the food. Many foods contain more than one serving per package.  Check the total grams (g) of carbohydrates in one serving. You can calculate the number of servings of carbohydrates in one serving by dividing the total carbohydrates by 15. For example, if a food has 30 g of total carbohydrates, it would be equal to 2 servings of carbohydrates.  Check the number of grams (g) of saturated and trans fats in one serving. Choose foods that have low or  no amount of these fats.  Check the number of milligrams (mg) of sodium in one serving. Most people should limit total sodium intake to less than 2,300 mg per day.  Always check the nutrition information of foods labeled as "low-fat" or "nonfat". These foods may be higher in added sugar or refined carbohydrates and should be avoided.  Talk to your dietitian to identify your daily goals for nutrients listed  on the label. Shopping  Avoid buying canned, premade, or processed foods. These foods tend to be high in fat, sodium, and added sugar.  Shop around the outside edge of the grocery store. This includes fresh fruits and vegetables, bulk grains, fresh meats, and fresh dairy. Cooking  Use low-heat cooking methods, such as baking, instead of high-heat cooking methods like deep frying.  Cook using healthy oils, such as olive, canola, or sunflower oil.  Avoid cooking with butter, cream, or high-fat meats. Meal planning  Eat meals and snacks regularly, preferably at the same times every day. Avoid going long periods of time without eating.  Eat foods high in fiber, such as fresh fruits, vegetables, beans, and whole grains. Talk to your dietitian about how many servings of carbohydrates you can eat at each meal.  Eat 4-6 ounces of lean protein each day, such as lean meat, chicken, fish, eggs, or tofu. 1 ounce is equal to 1 ounce of meat, chicken, or fish, 1 egg, or 1/4 cup of tofu.  Eat some foods each day that contain healthy fats, such as avocado, nuts, seeds, and fish. Lifestyle   Check your blood glucose regularly.  Exercise at least 30 minutes 5 or more days each week, or as told by your health care provider.  Take medicines as told by your health care provider.  Do not use any products that contain nicotine or tobacco, such as cigarettes and e-cigarettes. If you need help quitting, ask your health care provider.  Work with a Veterinary surgeon or diabetes educator to identify strategies to manage stress and any emotional and social challenges. What are some questions to ask my health care provider?  Do I need to meet with a diabetes educator?  Do I need to meet with a dietitian?  What number can I call if I have questions?  When are the best times to check my blood glucose? Where to find more information:  American Diabetes Association: diabetes.org/food-and-fitness/food  Academy  of Nutrition and Dietetics: https://www.vargas.com/  [employer] of Diabetes and Digestive and Kidney Diseases (NIH): FindJewelers.cz Summary  A healthy meal plan will help you control your blood glucose and maintain a healthy lifestyle.  Working with a diet and nutrition specialist (dietitian) can help you make a meal plan that is best for you.  Keep in mind that carbohydrates and alcohol have immediate effects on your blood glucose levels. It is important to count carbohydrates and to use alcohol carefully. This information is not intended to replace advice given to you by your health care provider. Make sure you discuss any questions you have with your health care provider. Document Released: 12/28/2004 Document Revised: 05/07/2016 Document Reviewed: 05/07/2016 Elsevier Interactive Patient Education  [employer].

## 2017-09-27 LAB — BASIC METABOLIC PANEL
BUN/Creatinine Ratio: 12 (ref 9–20)
BUN: 19 mg/dL (ref 6–20)
CALCIUM: 9.8 mg/dL (ref 8.7–10.2)
CHLORIDE: 98 mmol/L (ref 96–106)
CO2: 26 mmol/L (ref 20–29)
Creatinine, Ser: 1.61 mg/dL — ABNORMAL HIGH (ref 0.76–1.27)
GFR calc Af Amer: 63 mL/min/{1.73_m2} (ref >59)
GFR calc non Af Amer: 55 mL/min/{1.73_m2} — ABNORMAL LOW (ref >59)
GLUCOSE: 120 mg/dL — AB (ref 65–99)
POTASSIUM: 5.2 mmol/L (ref 3.5–5.2)
Sodium: 138 mmol/L (ref 134–144)

## 2017-09-27 LAB — AEROBIC/ANAEROBIC CULTURE (SURGICAL/DEEP WOUND)

## 2017-09-27 LAB — AEROBIC/ANAEROBIC CULTURE W GRAM STAIN (SURGICAL/DEEP WOUND)

## 2017-09-30 ENCOUNTER — Telehealth: Payer: Self-pay | Admitting: *Deleted

## 2017-09-30 NOTE — Telephone Encounter (Signed)
Patient verified DOB Patient is aware of labs improving but kidney function still being impaired. Patient advised to adhere to medication and DM control with plenty of water. Patient states he has been adhering and uses the restroom every two hours. Patient was able to keep appointment with DUDA last week and will return 10/15/17. Patient will see CPP on 10/21/17 and establish with provider fleming 11/27/17. No further questions.

## 2017-09-30 NOTE — Telephone Encounter (Signed)
-----   Message from Anders SimmondsAngela M McClung, [place] sent at 09/27/2017  7:12 PM EDT ----- Please call patient.  Labs are improving except kidney function is impaired.  Drink water and we will continue to watch this.  It should improve with better blood sugar control. Thanks,  [place]ian CoAngela McClung, PA-C

## 2017-10-02 ENCOUNTER — Telehealth: Payer: Self-pay | Admitting: General Practice

## 2017-10-02 NOTE — Telephone Encounter (Signed)
Patient friend called stating that on his last OV patient forgot to ask the provider to refer him to [facility]. Please f/u

## 2017-10-03 ENCOUNTER — Encounter (INDEPENDENT_AMBULATORY_CARE_PROVIDER_SITE_OTHER): Payer: Self-pay | Admitting: Orthopedic Surgery

## 2017-10-03 ENCOUNTER — Ambulatory Visit (INDEPENDENT_AMBULATORY_CARE_PROVIDER_SITE_OTHER): Payer: Self-pay | Admitting: Orthopedic Surgery

## 2017-10-03 VITALS — Ht 73.0 in | Wt 285.0 lb

## 2017-10-03 DIAGNOSIS — Z89421 Acquired absence of other right toe(s): Secondary | ICD-10-CM

## 2017-10-03 NOTE — Progress Notes (Signed)
Office Visit Note   Patient: Ronnie Shaw           Date of Birth: 10-19-1980           MRN: [id number] Visit Date: 10/03/2017              Requested by: No referring provider defined for this encounter. PCP: Patient, No Pcp Per  Chief Complaint  Patient presents with  . Right Foot - Routine Post Op    09/18/17 right foot 5th ray amputation       HPI: Patient is a 37 year old gentleman who presents 2 weeks status post right foot fifth ray amputation.  Patient states he has some wound dehiscence.  Assessment & Plan: Visit Diagnoses:  1. 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  Follow-Up Instructions: Return in about 2 weeks (around 10/17/2017).   Ortho Exam  Patient is alert, oriented, no adenopathy, well-dressed, normal affect, normal respiratory effort. Examination there is a small area of black eschar approximately 10 mm in diameter.  The remainder of the wound is well approximated no redness no cellulitis no odor no drainage.  Imaging: No results found. No images are attached to the encounter.  Labs: Lab Results  Component Value Date   HGBA1C 13.0 (H) 09/16/2017   ESRSEDRATE 102 (H) 09/16/2017   REPTSTATUS 09/27/2017 FINAL 09/18/2017   GRAMSTAIN  09/18/2017    ABUNDANT WBC PRESENT,BOTH PMN AND MONONUCLEAR RARE GRAM POSITIVE COCCI RARE GRAM VARIABLE ROD Performed at [facility], [address] [place], [place] [address]    CULT  09/18/2017    MODERATE GROUP B STREP(S.AGALACTIAE)ISOLATED TESTING AGAINST S. AGALACTIAE NOT ROUTINELY PERFORMED DUE TO PREDICTABILITY OF AMP/PEN/VAN SUSCEPTIBILITY. ABUNDANT FINEGOLDIA MAGNA MODERATE ANAEROBIC GRAM POSITIVE RODS      Lab Results  Component Value Date   ALBUMIN 3.8 12/18/2014   ALBUMIN 3.7 09/07/2010    Body mass index is 37.6  kg/m.  Orders:  No orders of the defined types were placed in this encounter.  No orders of the defined types were placed in this encounter.    Procedures: No procedures performed  Clinical Data: No additional findings.  ROS:  All other systems negative, except as noted in the HPI. Review of Systems  Objective: Vital Signs: Ht 6\' 1"  (1.854 m)   Wt 285 lb (129.3 kg)   BMI 37.60 kg/m   Specialty Comments:  No specialty comments available.  PMFS History: Patient Active Problem List   Diagnosis Date Noted  . 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 Diabetic polyneuropathy associated with type 2 diabetes mellitus (HCC)   . Osteomyelitis of right foot (HCC) 09/16/2017  . Diabetes mellitus type 2 in obese (HCC) 09/16/2017  . Normochromic normocytic anemia 09/16/2017  . Hyponatremia 09/16/2017  . Osteomyelitis (HCC) 09/16/2017  . Osteomyelitis of foot, right, acute (HCC) 09/16/2017   Past Medical History:  Diagnosis Date  . Cataract   . Diabetes mellitus   . Sarcoidosis     History reviewed. No pertinent family history.  Past Surgical History:  Procedure Laterality Date  . AMPUTATION Right 09/18/2017   Procedure: RIGHT FOOT 5TH RAY AMPUTATION;  Surgeon: [name], MD;  Location: [facility] OR;  Service: Orthopedics;  Laterality: Right;  . CATARACT EXTRACTION W/ INTRAOCULAR  LENS  IMPLANT, BILATERAL     Social History   Occupational History  . Not on file  Tobacco Use  . Smoking status: Former Smoker    Packs/day: 0.00  . Smokeless tobacco: Never Used  Substance and Sexual Activity  . Alcohol use: Yes    Comment: occasional  . Drug use: No  . Sexual activity: Not on file

## 2017-10-15 ENCOUNTER — Encounter (INDEPENDENT_AMBULATORY_CARE_PROVIDER_SITE_OTHER): Payer: Self-pay | Admitting: Orthopedic Surgery

## 2017-10-15 ENCOUNTER — Ambulatory Visit (INDEPENDENT_AMBULATORY_CARE_PROVIDER_SITE_OTHER): Payer: Self-pay | Admitting: Orthopedic Surgery

## 2017-10-15 DIAGNOSIS — Z89421 Acquired absence of other right toe(s): Secondary | ICD-10-CM

## 2017-10-15 NOTE — Progress Notes (Signed)
Office Visit Note   Patient: Ronnie Shaw           Date of Birth: 12/11/1980           MRN: [id number] Visit Date: 10/15/2017              Requested by: No referring provider defined for this encounter. PCP: Patient, No Pcp Per  Chief Complaint  Patient presents with  . Right Foot - Follow-up, Routine Post Op      HPI: Patient is a 37 year old gentleman who presents 4 weeks status post right foot fifth ray amputation.  Assessment & Plan: Visit Diagnoses:  1. 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  Follow-Up Instructions: Return in about 1 month (around 11/12/2017).   Ortho Exam  Patient is alert, oriented, no adenopathy, well-dressed, normal affect, normal respiratory effort. Examination the wound is well approximated except for the mid aspect of the wound with a little bit of breakdown there is no drainage no cellulitis of the wound has epithelialized well except for approximately a centimeter in the mid aspect of the wound.  There is no drainage no odor no signs of infection.  Imaging: No results found. No images are attached to the encounter.  Labs: Lab Results  Component Value Date   HGBA1C 13.0 (H) 09/16/2017   ESRSEDRATE 102 (H) 09/16/2017   REPTSTATUS 09/27/2017 FINAL 09/18/2017   GRAMSTAIN  09/18/2017    ABUNDANT WBC PRESENT,BOTH PMN AND MONONUCLEAR RARE GRAM POSITIVE COCCI RARE GRAM VARIABLE ROD Performed at [facility], [address] [place], [place] [address]    CULT  09/18/2017    MODERATE GROUP B STREP(S.AGALACTIAE)ISOLATED TESTING AGAINST S. AGALACTIAE NOT ROUTINELY PERFORMED DUE TO PREDICTABILITY OF AMP/PEN/VAN SUSCEPTIBILITY. ABUNDANT  FINEGOLDIA MAGNA MODERATE ANAEROBIC GRAM POSITIVE RODS      Lab Results  Component Value Date   ALBUMIN 3.8 12/18/2014   ALBUMIN 3.7 09/07/2010    There is no height or weight on file to calculate BMI.  Orders:  No orders of the defined types were placed in this encounter.  No orders of the defined types were placed in this encounter.    Procedures: No procedures performed  Clinical Data: No additional findings.  ROS:  All other systems negative, except as noted in the HPI. Review of Systems  Objective: Vital Signs: There were no vitals taken for this visit.  Specialty Comments:  No specialty comments available.  PMFS History: Patient Active Problem List   Diagnosis Date Noted  . 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 Diabetic polyneuropathy associated with type 2 diabetes mellitus (HCC)   . Osteomyelitis of right foot (HCC) 09/16/2017  . Diabetes mellitus type 2 in obese (HCC) 09/16/2017  . Normochromic normocytic anemia 09/16/2017  . Hyponatremia 09/16/2017  . Osteomyelitis (HCC) 09/16/2017  . Osteomyelitis of foot, right, acute (HCC) 09/16/2017   Past Medical History:  Diagnosis Date  . Cataract   . Diabetes mellitus   . Sarcoidosis     History reviewed. No pertinent family history.  Past Surgical History:  Procedure Laterality Date  . AMPUTATION Right 09/18/2017   Procedure: RIGHT FOOT 5TH RAY  AMPUTATION;  Surgeon: [name], MD;  Location: [facility] OR;  Service: Orthopedics;  Laterality: Right;  . CATARACT EXTRACTION W/ INTRAOCULAR LENS  IMPLANT, BILATERAL     Social History   Occupational History  . Not on file  Tobacco Use  . Smoking status: Former Smoker    Packs/day: 0.00  . Smokeless tobacco: Never Used  Substance and Sexual Activity  . Alcohol use: Yes    Comment: occasional  . Drug use: No  . Sexual activity: Not on file

## 2017-10-21 ENCOUNTER — Ambulatory Visit: Payer: Self-pay | Attending: Family Medicine | Admitting: Pharmacist

## 2017-10-21 DIAGNOSIS — E1169 Type 2 diabetes mellitus with other specified complication: Secondary | ICD-10-CM

## 2017-10-21 DIAGNOSIS — Z89421 Acquired absence of other right toe(s): Secondary | ICD-10-CM | POA: Insufficient documentation

## 2017-10-21 DIAGNOSIS — E119 Type 2 diabetes mellitus without complications: Secondary | ICD-10-CM | POA: Insufficient documentation

## 2017-10-21 DIAGNOSIS — Z794 Long term (current) use of insulin: Secondary | ICD-10-CM | POA: Insufficient documentation

## 2017-10-21 DIAGNOSIS — E669 Obesity, unspecified: Secondary | ICD-10-CM

## 2017-10-21 DIAGNOSIS — Z87891 Personal history of nicotine dependence: Secondary | ICD-10-CM | POA: Insufficient documentation

## 2017-10-21 NOTE — Patient Instructions (Signed)
Thank you for coming to see me today. Please start recording your sugar levels, when you took them, what you ate, and how much insulin you used.   I am not going to make changes today but I'll see you in about 2 weeks to reevaluate.

## 2017-10-21 NOTE — Progress Notes (Signed)
S:    PCP: will establish with Bertram DenverZelda Fleming 11/27/17  Patient arrives in good spirits.  Presents for diabetes evaluation, education, and management at the request of Marylene Landngela. Patient was referred on 09/26/17.  Patient to est care with [name] on 11/27/17.  Family/Social History: - no family history on file - Patient is single; aunt presents with him today - Tobacco:  Former smoker - amount per day and quit date not given - Alcohol: occasional use  Insurance coverage/medication affordability:  -unisured  Medications:  Patient reports adherence with medications.  Current diabetes medications include:  - Novolog 70/30 40 units BID. Of note, patient states he tests sugars and doses BID accordingly. Most often uses 30-40 units BID based on sugar levels. He states not having to use insulin in the past several days d/t fear of hypoglycemia.   Patient denies hypoglycemic events.   Patient reported dietary habits:  Eats 2 meals/day Breakfast: no breakfast. Most often snacks on fresh watermelon ~10-11 AM Lunch: chef salad - reports eating a lot of salad Dinner: chef salad, sometimes lean protein or fish, ribs only occasionally. Sometimes eats potatoes. Snacks: fresh fruit - most often watermelon Drinks: water, Brisk tea, G2 Gatorade, G0 Gatorade   Patient-reported exercise habits:  - Pt states being able to get some physical activity. - Currently limited d/t recent right foot fifth toe amputation - Is motivated to resume physical activity once healed   Patient reports nocturia. - States that he can tell sometimes when sugar is really elevated. Reports having to urinate ~4 times overnight.  Patient denies neuropathy. Patient denies visual changes. Patient denies self foot exams.   O:  POCT glucose: 194. Of note, patient states eating a chicken, cheese, and rice bowl from [employer] ~30 minutes before arriving to appointment.   Lab Results  Component Value Date   HGBA1C 13.0 (H)  09/16/2017   Lipid Panel   No results found for: CHOL, TRIG, HDL, CHOLHDL, VLDL, LDLCALC, LDLDIRECT  Pt reported home glucose levels.  - Pt does not bring in meter or log with him today.  - Home fasting CBG: states being in low 100s. Most often 120s-140s.   - 2 hour post-prandial/random CBG: states that these are also in the 100s. He reports being surprised if any reading is >140.  Clinical ASCVD: No  ASCVD risk calculation: not appropriate as patient is 37 yo   A/P: Diabetes longstanding currently uncontrolled based on last A1c of 13.0. Patient is able to verbalize appropriate hypoglycemia management plan. Patient is adherent with medication. Since last visit, he has worked hard to follow a diabetic diet. Has increased intake of fresh fruits and vegetables with decreased intake of carbs and sweetened foods/drinks.   Pt reported home glycemic control is at goal. However, I was unable to view subjective readings as he did not bring glucometer.  Will have pt follow-up in 2 weeks with glucometer/readings and make adjustment to therapy then.   -Continued Novolog mix 70/30 40 units BID.   -Extensively discussed pathophysiology of DM, recommended lifestyle interventions, dietary effects on glycemic control -Counseled on s/sx of and management of hypoglycemia -Next A1C anticipated 12/2017.    Written patient instructions provided.  Total time in face to face counseling 15 minutes.   Follow up Pharmacist Clinic Visit in 2 weeks.   Patient seen with Mirna MiresAngela Lassiter, PharmD Candidate [employer]  Class of 2020  Butch PennyLuke Van Ausdall, PharmD, CPP Clinical Pharmacist [facility] [phone]

## 2017-10-22 ENCOUNTER — Encounter: Payer: Self-pay | Admitting: Pharmacist

## 2017-10-22 LAB — GLUCOSE, POCT (MANUAL RESULT ENTRY): POC GLUCOSE: 194 mg/dL — AB (ref 70–99)

## 2017-10-24 ENCOUNTER — Other Ambulatory Visit: Payer: Self-pay | Admitting: Physician Assistant

## 2017-10-24 DIAGNOSIS — E1169 Type 2 diabetes mellitus with other specified complication: Secondary | ICD-10-CM

## 2017-10-24 DIAGNOSIS — E669 Obesity, unspecified: Principal | ICD-10-CM

## 2017-10-24 NOTE — Telephone Encounter (Signed)
Referral was placed 

## 2017-11-05 ENCOUNTER — Encounter: Payer: Self-pay | Admitting: Pharmacist

## 2017-11-05 ENCOUNTER — Ambulatory Visit: Payer: Self-pay | Attending: Family Medicine | Admitting: Pharmacist

## 2017-11-05 DIAGNOSIS — E1169 Type 2 diabetes mellitus with other specified complication: Secondary | ICD-10-CM

## 2017-11-05 DIAGNOSIS — E669 Obesity, unspecified: Secondary | ICD-10-CM

## 2017-11-05 DIAGNOSIS — E119 Type 2 diabetes mellitus without complications: Secondary | ICD-10-CM | POA: Insufficient documentation

## 2017-11-05 DIAGNOSIS — Z87891 Personal history of nicotine dependence: Secondary | ICD-10-CM | POA: Insufficient documentation

## 2017-11-05 DIAGNOSIS — Z794 Long term (current) use of insulin: Secondary | ICD-10-CM | POA: Insufficient documentation

## 2017-11-05 MED ORDER — INSULIN GLARGINE 100 UNIT/ML SOLOSTAR PEN: 10.0000 [IU] | PEN_INJECTOR | Freq: Every day | SUBCUTANEOUS | 0 refills | Status: DC

## 2017-11-05 MED FILL — !LANTUS SOLOSTAR 100UNITS/M: 100 | 28 days supply | Qty: 3 | Fill #0

## 2017-11-05 NOTE — Progress Notes (Signed)
S:    PCP: will establish with Ronnie Shaw 11/27/17  Patient arives in good spirits. Presents for diabetes follow-up. He was referred on 09/26/17 by Ronnie LandAngela. Patient to est care with Ronnie Shaw on 11/27/17.  Pt last saw me 10/21/17. Reported compliance with Novolog 70/30 BID but stated that he started to not use his insulin when he noticed improvement of home levels. Additionally, he was worried about hypoglycemia. Even without insulin, reported sugars at that visit were reported to be most often in the 120s-140s. Was unable to view this on his meter. Recommended for him to bring in meter to next visit.   Today he reports only using insulin 2-3 times in the past 2 weeks. He brings his meter with him today.  Family/Social History: - no family history on file - Patient is single; brother presents with him today - Tobacco:  Former smoker - Alcohol: occasional use  Insurance coverage/medication affordability:  -uninsured -PASS will be coordinated once patient establishes with Ronnie Shaw  Medications:  - Patient denies adherence with insulin. He has only taken it 2-3 times in the past 2 weeks.   Patient reports 1 hypoglycemic event since last time. Reports going to a cookout and waiting too long to eat. Also states that he thinks the heat could have worsened it. Was able to successfully treat with soda.  Patient reported dietary habits:  - No changes since last PharmD visit  Patient-reported exercise habits:  - Pt states being able to get some physical activity. - Currently limited d/t recent right foot fifth toe amputation - Is motivated to resume physical activity once healed   Patient reports nocturia. - States that he can tell sometimes when sugar is really elevated. Reports having to urinate ~4 times overnight.  Patient denies neuropathy. Patient denies visual changes. Patient denies self foot exams.   O:  POCT glucose: 159. Of note, patient states he ate a sub about ~1 hour before  appointment.   Lab Results  Component Value Date   HGBA1C 13.0 (H) 09/16/2017   Lipid Panel   No results found for: CHOL, TRIG, HDL, CHOLHDL, VLDL, LDLCALC, LDLDIRECT  Pt brings in glucometer:                                   Fasting      Random/Prandial  10/22/17:          97                   133 10/23/17         97                   134  10/24/17         116                 143 10/25/17         140                  --  10/26/17         125                 137 10/27/17         127                 162  10/30/17         147                  --  11/01/17           --                  155 11/04/17           --                  140    Clinical ASCVD: No  ASCVD risk calculation: not appropriate as patient is 37 yo   A/P: Diabetes longstanding currently uncontrolled based on last A1c of 13.0. Objective readings were viewed on meter today. I have listed those above and bolded above goal readings. POCT glucose level at goal considering he ate before coming to the clinic today. Ronnie Shaw is managing his diabetes in large part with dietary modifications. Has increased intake of fresh fruits and vegetables with decreased intake of carbs and sweetened foods/drinks.   Patient's home glucose levels close to goal. He has only 2 fasting levels over the past 2 weeks that have been above goal. A1c in June resulted at 13.0. Since his sugar levels are close to goal with almost no insulin in the past 2 weeks, I would like to DC the Novolog 70/30 and place him on a small dose of basal insulin to achieve FPG range of 80-130.   -Discontinue Novolog mix 70/30 40 units BID.    -Start Lantus 10 units daily at 10 PM -Injection technique covered. Patient demonstrates understanding -Extensively discussed pathophysiology of DM, recommended lifestyle interventions, dietary effects on glycemic control -Counseled on s/sx of and management of hypoglycemia. Pt knows to contact me if home levels drop below 70 or if he  starts to feel symptoms of hypoglycemia.  -Next A1C anticipated 12/2017.   Written patient instructions provided.  Total time in face to face counseling 15 minutes.   Follow up with PCP in 11/27/17.   Patient seen with Ronnie Shaw, PharmD Candidate [employer]  Class of 2020  Ronnie Shaw, PharmD, CPP Clinical Pharmacist [facility] [phone]

## 2017-11-05 NOTE — Patient Instructions (Addendum)
Thank you for coming to see us today. Please do the following:  1. Continue to work on lifestyle and monitor home sugars.   2. Start taking Lantus 10 units every day at 10:00 PM.   3. If you get any readings under 70 or start to feel bad or have any questions, call me and I can adjust over the phone. My number here is [phone].   Hypoglycemia or low blood sugar:   Low blood sugar can happen quickly and may become an emergency if not treated right away.   While this shouldn't happen often, it can be brought upon if you skip a meal or do not eat enough. Also, if your insulin or other diabetes medications are dosed too high, this can cause your blood sugar to go to low.   Warning signs of low blood sugar include: 1. Feeling shaky or dizzy 2. Feeling weak or tired  3. Excessive hunger 4. Feeling anxious or upset  5. Sweating even when you aren't exercising  What to do if I experience low blood sugar? 1. Check your blood sugar with your meter. If lower than 70, proceed to step 2.  2. Treat with 3-4 glucose tablets or 3 packets of regular sugar. If these aren't around, you can try hard candy. Yet another option would be to drink 4 ounces of fruit juice or 6 ounces of REGULAR soda.  3. Re-check your sugar in 15 minutes. If it is still below 70, do what you did in step 2 again. If has come back up, go ahead and eat a snack or small meal at this time.

## 2017-11-14 ENCOUNTER — Ambulatory Visit (INDEPENDENT_AMBULATORY_CARE_PROVIDER_SITE_OTHER): Payer: Self-pay | Admitting: Orthopedic Surgery

## 2017-11-14 ENCOUNTER — Encounter (INDEPENDENT_AMBULATORY_CARE_PROVIDER_SITE_OTHER): Payer: Self-pay | Admitting: Orthopedic Surgery

## 2017-11-14 VITALS — Ht 73.0 in | Wt 285.0 lb

## 2017-11-14 DIAGNOSIS — Z89421 Acquired absence of other right toe(s): Secondary | ICD-10-CM

## 2017-11-17 ENCOUNTER — Encounter (INDEPENDENT_AMBULATORY_CARE_PROVIDER_SITE_OTHER): Payer: Self-pay | Admitting: Orthopedic Surgery

## 2017-11-17 NOTE — Progress Notes (Signed)
Office Visit Note   Patient: Ronnie Shaw           Date of Birth: [birth year]           MRN: [id number] Visit Date: 11/14/2017              Requested by: No referring provider defined for this encounter. PCP: Patient, No Pcp Per  Chief Complaint  Patient presents with  . Right Foot - Routine Post Op    09/18/17 right 5th ray amputation       HPI: Patient is a 37 year old gentleman who presents for follow-up status post right foot fifth ray amputation.  Patient states his blood sugars are under good control in the 120s to 140s.  Patient is currently nonweightbearing with crutches and a postoperative shoe he has been wearing the medical compression stocking but not today.  Assessment & Plan: Visit Diagnoses:  1. 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  Continue with the sock 24 hours a day.  Follow-Up Instructions: Return in about 3 weeks (around 12/05/2017).   Ortho Exam  Patient is alert, oriented, no adenopathy, well-dressed, normal affect, normal respiratory effort. Examination patient has a very small residual wound with good granulation tissue the wound measures 5 x 10 mm and 2 mm deep.  Patient has a strong dorsalis pedis pulse no drainage no odor no cellulitis no signs of infection.  Imaging: No results found. No images are attached to the encounter.  Labs: Lab Results  Component Value Date   HGBA1C 13.0 (H) 09/16/2017   ESRSEDRATE 102 (H) 09/16/2017   REPTSTATUS 09/27/2017 FINAL 09/18/2017   GRAMSTAIN  09/18/2017    ABUNDANT WBC PRESENT,BOTH PMN AND MONONUCLEAR RARE GRAM POSITIVE COCCI RARE GRAM VARIABLE ROD Performed at [facility], [address], [place], [place] [address]    CULT  09/18/2017    MODERATE GROUP B STREP(S.AGALACTIAE)ISOLATED TESTING AGAINST S. AGALACTIAE NOT ROUTINELY PERFORMED DUE TO PREDICTABILITY OF AMP/PEN/VAN  SUSCEPTIBILITY. ABUNDANT FINEGOLDIA MAGNA MODERATE ANAEROBIC GRAM POSITIVE RODS      Lab Results  Component Value Date   ALBUMIN 3.8 12/18/2014   ALBUMIN 3.7 09/07/2010    Body mass index is 37.6 kg/m.  Orders:  No orders of the defined types were placed in this encounter.  No orders of the defined types were placed in this encounter.    Procedures: No procedures performed  Clinical Data: No additional findings.  ROS:  All other systems negative, except as noted in the HPI. Review of Systems  Objective: Vital Signs: Ht 6\' 1"  (1.854 m)   Wt 285 lb (129.3 kg)   BMI 37.60 kg/m   Specialty Comments:  No specialty comments available.  PMFS History: Patient Active Problem List   Diagnosis Date Noted  . 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 Diabetic polyneuropathy associated with type 2 diabetes mellitus (HCC)   . Osteomyelitis of right foot (HCC) 09/16/2017  . Diabetes mellitus type 2 in obese (HCC) 09/16/2017  . Normochromic normocytic anemia 09/16/2017  . Hyponatremia 09/16/2017  . Osteomyelitis (HCC) 09/16/2017  . Osteomyelitis of foot, right, acute (HCC) 09/16/2017   Past Medical History:  Diagnosis Date  . Cataract   . Diabetes mellitus   . Sarcoidosis     History reviewed. No pertinent family history.  Past Surgical History:  Procedure Laterality Date  .  AMPUTATION Right 09/18/2017   Procedure: RIGHT FOOT 5TH RAY AMPUTATION;  Surgeon: Nadara Mustarduda, Saxon Barich V, MD;  [facility];  Service: Orthopedics;  Laterality: Right;  . CATARACT EXTRACTION W/ INTRAOCULAR LENS  IMPLANT, BILATERAL     Social History   Occupational History  . Not on file  Tobacco Use  . Smoking status: Former Smoker    Packs/day: 0.00  . Smokeless tobacco: Never Used  Substance and Sexual Activity  . Alcohol use: Yes    Comment: occasional  . Drug use: No  . Sexual activity: Not on file

## 2017-11-18 ENCOUNTER — Ambulatory Visit: Payer: Self-pay | Admitting: Dietician

## 2017-11-27 ENCOUNTER — Encounter: Payer: Self-pay | Admitting: Nurse Practitioner

## 2017-11-27 ENCOUNTER — Ambulatory Visit: Payer: Self-pay | Attending: Nurse Practitioner | Admitting: Nurse Practitioner

## 2017-11-27 VITALS — BP 137/94 | HR 96 | Temp 98.0°F | Ht 73.0 in | Wt 289.6 lb

## 2017-11-27 DIAGNOSIS — M86171 Other acute osteomyelitis, right ankle and foot: Secondary | ICD-10-CM | POA: Insufficient documentation

## 2017-11-27 DIAGNOSIS — E1165 Type 2 diabetes mellitus with hyperglycemia: Secondary | ICD-10-CM | POA: Insufficient documentation

## 2017-11-27 DIAGNOSIS — D869 Sarcoidosis, unspecified: Secondary | ICD-10-CM | POA: Insufficient documentation

## 2017-11-27 DIAGNOSIS — Z888 Allergy status to other drugs, medicaments and biological substances status: Secondary | ICD-10-CM | POA: Insufficient documentation

## 2017-11-27 DIAGNOSIS — E1142 Type 2 diabetes mellitus with diabetic polyneuropathy: Secondary | ICD-10-CM

## 2017-11-27 DIAGNOSIS — E1169 Type 2 diabetes mellitus with other specified complication: Secondary | ICD-10-CM | POA: Insufficient documentation

## 2017-11-27 DIAGNOSIS — L98491 Non-pressure chronic ulcer of skin of other sites limited to breakdown of skin: Secondary | ICD-10-CM | POA: Insufficient documentation

## 2017-11-27 DIAGNOSIS — M869 Osteomyelitis, unspecified: Secondary | ICD-10-CM

## 2017-11-27 DIAGNOSIS — Z79899 Other long term (current) drug therapy: Secondary | ICD-10-CM | POA: Insufficient documentation

## 2017-11-27 DIAGNOSIS — Z794 Long term (current) use of insulin: Secondary | ICD-10-CM | POA: Insufficient documentation

## 2017-11-27 DIAGNOSIS — E669 Obesity, unspecified: Secondary | ICD-10-CM | POA: Insufficient documentation

## 2017-11-27 LAB — GLUCOSE, POCT (MANUAL RESULT ENTRY): POC Glucose: 137 mg/dl — AB (ref 70–99)

## 2017-11-27 MED ORDER — ATORVASTATIN CALCIUM 20 MG PO TABS: 20.0000 mg | ORAL_TABLET | Freq: Every day | ORAL | 3 refills | Status: DC

## 2017-11-27 MED ORDER — GLUCOSE BLOOD VI STRP: ORAL_STRIP | 12 refills | Status: DC

## 2017-11-27 MED ORDER — LISINOPRIL 5 MG PO TABS: 5.0000 mg | ORAL_TABLET | Freq: Every day | ORAL | 3 refills | Status: DC

## 2017-11-27 MED ORDER — TRUEPLUS LANCETS 28G MISC: 1.0000 | Freq: Three times a day (TID) | 12 refills | Status: DC

## 2017-11-27 MED FILL — LISINOPRIL 5 MG TAB: 5 | 30 days supply | Qty: 30 | Fill #0

## 2017-11-27 MED FILL — TRUE METRIX TEST STRIP: 30 days supply | Qty: 100 | Fill #1

## 2017-11-27 MED FILL — ATORVASTATIN 20 MG TABLET: 20 | 30 days supply | Qty: 30 | Fill #0

## 2017-11-27 MED FILL — TRUEplus LANCETS 28G MISC: 30 days supply | Qty: 100 | Fill #1

## 2017-11-27 NOTE — Patient Instructions (Signed)
Diabetes blood sugar goals  Fasting in AM before breakfast which means at least 8 hrs of no eating or drinking) except water or unsweetened coffee or tea): 90-180 2 hrs after meals: < 180,   Hypoglycemia or low blood sugar: < 70 (You should not have hypoglycemia.)  Aim for 30 minutes of exercise most days. Rethink what you drink. Water is great! Aim for 2-3 Carb Choices per meal (30-45 grams) +/- 1 either way  Aim for 0-15 Carbs per snack if hungry  Include protein in moderation with your meals and snacks  Consider reading food labels for Total Carbohydrate and Fat Grams of foods  Consider checking BG at alternate times per day  Continue taking medication as directed Be mindful about how much sugar you are adding to beverages and other foods. Fruit Punch - find one with no sugar  Measure and decrease portions of carbohydrate foods  Make your plate and don't go back for seconds

## 2017-11-27 NOTE — Progress Notes (Signed)
Assessment & Plan:  Ronnie Shaw was seen today for establish care.  Diagnoses and all orders for this visit:  Diabetes mellitus type 2 in obese (HCC) -     Glucose (CBG) -     lisinopril (PRINIVIL,ZESTRIL) 5 MG tablet; Take 1 tablet (5 mg total) by mouth daily. -     Microalbumin/Creatinine Ratio, Urine -     CBC -     CMP14+EGFR -     Lipid panel -     TSH -     glucose blood (TRUE METRIX BLOOD GLUCOSE TEST) test strip; Use as instructed -     TRUEPLUS LANCETS 28G MISC; 1 each by Does not apply route 3 (three) times daily. -     atorvastatin (LIPITOR) 20 MG tablet; Take 1 tablet (20 mg total) by mouth daily. INSTRUCTIONS: Work on a low fat, heart healthy diet and participate in regular aerobic exercise program by working out at least 150 minutes per week; 5 days a week-30 minutes per day. Avoid red meat, fried foods. junk foods, sodas, sugary drinks, unhealthy snacking, alcohol and smoking.  Drink at least 48oz of water per day and monitor your carbohydrate intake daily.  Controlled Continue medications as prescribed.  Continue blood sugar control as discussed in office today, low carbohydrate diet, and regular physical exercise as tolerated, 150 minutes per week (30 min each day, 5 days per week, or 50 min 3 days per week). Keep blood sugar logs with fasting goal of 90-130 mg/dl, post prandial (after you eat) less than 180.  For Hypoglycemia: BS <60 and Hyperglycemia BS >400; contact the clinic ASAP. Annual eye exams and foot exams are recommended.  Osteomyelitis of right foot, unspecified type [facility]) Being followed by Orthopedics  Skin ulceration, limited to breakdown of skin [facility]) Being followed by Orthopedics.      Patient has been counseled on age-appropriate routine health concerns for screening and prevention. These are reviewed and up-to-date. Referrals have been placed accordingly. Immunizations are up-to-date or declined.    Subjective:   Chief Complaint  Patient  presents with  . Establish Care    Pt. is here to establish care for diabetes.    HPI Ronnie Shaw 37 y.o. male presents to office today to establish care for DM. He has a history of sarcoidosis (stable),  poorly controlled DM as well as acute osteomyelitis of right 5th metatarsal requiring amputation (09-18-2017).   Type 2 Diabetes Mellitus Disease course has been stable. There are no hypoglycemic symptoms or hypoglycemic complications. There are diabetic complications. Risk factors for coronary artery disease include family history, dyslipidemia, diabetes mellitus, obesity, hypertension. Current diabetic treatment includes LANTUS 10Units. Patient is compliant with treatment all of the time and monitors blood glucose at home once a day.   Home blood glucose trend : (FBS 100-140 mg/dl)  Weight is  stable. Patient is monitoring his carbohydrate intake. Meal planning includes avoidance of concentrated sweets. Patient WAS A NO SHOW FOR DIETICIAN APPOINTMENT. However he has seen the pharmacist for diabetic education. Patient is not compliant with exercise.   An ACE inhibitor/angiotensin II receptor blocker is being taken. Patient does not see a podiatrist currently.  Eye exam is not current.  Lab Results  Component Value Date   HGBA1C 13.0 (H) 09/16/2017   Review of Systems  Constitutional: Negative for fever, malaise/fatigue and weight loss.  HENT: Negative.  Negative for nosebleeds.   Eyes: Negative.  Negative for blurred vision, double vision and photophobia.  Respiratory:  Negative.  Negative for cough and shortness of breath.   Cardiovascular: Negative.  Negative for chest pain, palpitations and leg swelling.  Gastrointestinal: Negative.  Negative for heartburn, nausea and vomiting.  Musculoskeletal: Negative for myalgias.       History of osteomyelitis; amputation  Neurological: Negative.  Negative for dizziness, focal weakness, seizures and headaches.  Psychiatric/Behavioral: Negative.   Negative for suicidal ideas.    Past Medical History:  Diagnosis Date  . Cataract   . Diabetes mellitus   . Sarcoidosis     Past Surgical History:  Procedure Laterality Date  . AMPUTATION Right 09/18/2017   Procedure: RIGHT FOOT 5TH RAY AMPUTATION;  Surgeon: Duda, Marcus V, MD;  Location: [facility] OR;  Service: Orthopedics;  Laterality: Right;  . CATARACT EXTRACTION W/ INTRAOCULAR LENS  IMPLANT, BILATERAL      Family History  Problem Relation Age of Onset  . Diabetes Maternal Aunt     Social History Reviewed with no changes to be made today.   Outpatient Medications Prior to Visit  Medication Sig Dispense Refill  . Blood Glucose Monitoring Suppl (TRUE METRIX METER) w/Device KIT 1 each by Does not apply route 3 (three) times daily. 1 kit 0  . Insulin Glargine (LANTUS SOLOSTAR) 100 UNIT/ML Solostar Pen Inject 10 Units into the skin daily. 3 mL 0  . glucose blood (TRUE METRIX BLOOD GLUCOSE TEST) test strip Use as instructed 100 each 12  . TRUEPLUS LANCETS 28G MISC 1 each by Does not apply route 3 (three) times daily. 100 each 12  . ondansetron (ZOFRAN) 4 MG tablet Take 1 tablet (4 mg total) by mouth daily as needed for nausea or vomiting. (Patient not taking: Reported on 11/27/2017) 30 tablet 1  . insulin aspart protamine- aspart (NOVOLOG MIX 70/30) (70-30) 100 UNIT/ML injection Inject 0.4 mLs (40 Units total) into the skin 2 (two) times daily with a meal. (Patient not taking: Reported on 11/27/2017) 10 mL 3   No facility-administered medications prior to visit.     Allergies  Allergen Reactions  . Prednisone Other (See Comments)    "makes me go blind", "that's how I got cataracts".       Objective:    BP (!) 137/94 (BP Location: Left Arm, Patient Position: Sitting, Cuff Size: Large)   Pulse 96   Temp 98 F (36.7 C) (Oral)   Ht 6' 1" (1.854 m)   Wt 289 lb 9.6 oz (131.4 kg)   SpO2 99%   BMI 38.21 kg/m  Wt Readings from Last 3 Encounters:  11/27/17 289 lb 9.6 oz (131.4 kg)    11/14/17 285 lb (129.3 kg)  10/03/17 285 lb (129.3 kg)    Physical Exam  Constitutional: He is oriented to person, place, and time. He appears well-developed and well-nourished. He is cooperative.  HENT:  Head: Normocephalic and atraumatic.  Eyes: EOM are normal.  Neck: Normal range of motion.  Cardiovascular: Normal rate, regular rhythm and normal heart sounds. Exam reveals no gallop and no friction rub.  No murmur heard. Pulmonary/Chest: Effort normal and breath sounds normal. No tachypnea. No respiratory distress. He has no decreased breath sounds. He has no wheezes. He has no rhonchi. He has no rales. He exhibits no tenderness.  Abdominal: Bowel sounds are normal.  Musculoskeletal: Normal range of motion. He exhibits no edema.  NWB RLE  Neurological: He is alert and oriented to person, place, and time. Coordination normal.  Skin: Skin is warm and dry.  Patient with compression stocking on   RLE with postop shoe.    Psychiatric: He has a normal mood and affect. His behavior is normal. Judgment and thought content normal.  Nursing note and vitals reviewed.        Patient has been counseled extensively about nutrition and exercise as well as the importance of adherence with medications and regular follow-up. The patient was given clear instructions to go to ER or return to medical center if symptoms don't improve, worsen or new problems develop. The patient verbalized understanding.   Follow-up: Return in about 26 days (around 12/23/2017) for DM.   Gildardo Pounds, FNP-BC [facility] [place], [place]   11/27/2017, 10:51 PM

## 2017-11-28 LAB — MICROALBUMIN / CREATININE URINE RATIO
Creatinine, Urine: 108.3 mg/dL
MICROALB/CREAT RATIO: 7.2 mg/g{creat} (ref 0.0–30.0)
MICROALBUM., U, RANDOM: 7.8 ug/mL

## 2017-11-28 LAB — CMP14+EGFR
ALBUMIN: 4.4 g/dL (ref 3.5–5.5)
ALT: 14 IU/L (ref 0–44)
AST: 15 IU/L (ref 0–40)
Albumin/Globulin Ratio: 1.5 (ref 1.2–2.2)
Alkaline Phosphatase: 74 IU/L (ref 39–117)
BUN / CREAT RATIO: 14 (ref 9–20)
BUN: 16 mg/dL (ref 6–20)
Bilirubin Total: 0.2 mg/dL (ref 0.0–1.2)
CALCIUM: 9.8 mg/dL (ref 8.7–10.2)
CO2: 24 mmol/L (ref 20–29)
Chloride: 100 mmol/L (ref 96–106)
Creatinine, Ser: 1.17 mg/dL (ref 0.76–1.27)
GFR calc Af Amer: 93 mL/min/{1.73_m2} (ref >59)
GFR, EST NON AFRICAN AMERICAN: 80 mL/min/{1.73_m2} (ref >59)
GLOBULIN, TOTAL: 3 g/dL (ref 1.5–4.5)
GLUCOSE: 118 mg/dL — AB (ref 65–99)
Potassium: 4.4 mmol/L (ref 3.5–5.2)
SODIUM: 138 mmol/L (ref 134–144)
Total Protein: 7.4 g/dL (ref 6.0–8.5)

## 2017-11-28 LAB — CBC
HEMATOCRIT: 39.6 % (ref 37.5–51.0)
HEMOGLOBIN: 13.3 g/dL (ref 13.0–17.7)
MCH: 29.2 pg (ref 26.6–33.0)
MCHC: 33.6 g/dL (ref 31.5–35.7)
MCV: 87 fL (ref 79–97)
Platelets: 265 10*3/uL (ref 150–450)
RBC: 4.56 x10E6/uL (ref 4.14–5.80)
RDW: 15.7 % — ABNORMAL HIGH (ref 12.3–15.4)
WBC: 4.6 10*3/uL (ref 3.4–10.8)

## 2017-11-28 LAB — LIPID PANEL
CHOL/HDL RATIO: 4.8 ratio (ref 0.0–5.0)
CHOLESTEROL TOTAL: 153 mg/dL (ref 100–199)
HDL: 32 mg/dL — ABNORMAL LOW (ref >39)
LDL CALC: 86 mg/dL (ref 0–99)
TRIGLYCERIDES: 173 mg/dL — AB (ref 0–149)
VLDL CHOLESTEROL CAL: 35 mg/dL (ref 5–40)

## 2017-11-28 LAB — TSH: TSH: 1.89 u[IU]/mL (ref 0.450–4.500)

## 2017-12-05 ENCOUNTER — Ambulatory Visit (INDEPENDENT_AMBULATORY_CARE_PROVIDER_SITE_OTHER): Payer: Self-pay | Admitting: Orthopedic Surgery

## 2017-12-05 ENCOUNTER — Telehealth: Payer: Self-pay

## 2017-12-05 ENCOUNTER — Encounter (INDEPENDENT_AMBULATORY_CARE_PROVIDER_SITE_OTHER): Payer: Self-pay | Admitting: Orthopedic Surgery

## 2017-12-05 VITALS — Ht 73.0 in | Wt 289.0 lb

## 2017-12-05 DIAGNOSIS — Z89421 Acquired absence of other right toe(s): Secondary | ICD-10-CM

## 2017-12-05 NOTE — Progress Notes (Signed)
Office Visit Note   Patient: Ronnie Shaw           Date of Birth: 02/09/1981           MRN: [id number] VisKarren Burlyit Date: 12/05/2017              Requested by: No referring provider defined for this encounter. PCP: Patient, No Pcp Per  Chief Complaint  Patient presents with  . Right Foot - Routine Post Op    09/18/17 right 5th ray amputation       HPI: Patient is a 37 year old gentleman who presents 2-1/2 months status post right foot fifth ray amputation he has been on crutches and a postoperative shoe.  Assessment & Plan: Visit Diagnoses:  1. 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  Follow-Up Instructions: Return in about 3 weeks (around 12/26/2017).   Ortho Exam  Patient is alert, oriented, no adenopathy, well-dressed, normal affect, normal respiratory effort. Examination patient has significant callus over the lateral aspect of his foot from weightbearing.  After informed consent a 10 blade knife was used to debride the callus tissue the callused area was 10 x 20 mm the wound opening was 2 x 5 mm and 1 mm deep there is healthy tissue there is no redness no cellulitis no drainage no odor no signs of infection.  Patient does have venous stasis swelling.  Imaging: No results found. No images are attached to the encounter.  Labs: Lab Results  Component Value Date   HGBA1C 13.0 (H) 09/16/2017   ESRSEDRATE 102 (H) 09/16/2017   REPTSTATUS 09/27/2017 FINAL 09/18/2017   GRAMSTAIN  09/18/2017    ABUNDANT WBC PRESENT,BOTH PMN AND MONONUCLEAR RARE GRAM POSITIVE COCCI RARE GRAM VARIABLE ROD Performed at [facility], [address] [place], [place] [id number]    CULT  09/18/2017    MODERATE GROUP B STREP(S.AGALACTIAE)ISOLATED TESTING AGAINST  S. AGALACTIAE NOT ROUTINELY PERFORMED DUE TO PREDICTABILITY OF AMP/PEN/VAN SUSCEPTIBILITY. ABUNDANT FINEGOLDIA MAGNA MODERATE ANAEROBIC GRAM POSITIVE RODS      Lab Results  Component Value Date   ALBUMIN 4.4 11/27/2017   ALBUMIN 3.8 12/18/2014   ALBUMIN 3.7 09/07/2010    Body mass index is 38.13 kg/m.  Orders:  No orders of the defined types were placed in this encounter.  No orders of the defined types were placed in this encounter.    Procedures: No procedures performed  Clinical Data: No additional findings.  ROS:  All other systems negative, except as noted in the HPI. Review of Systems  Objective: Vital Signs: Ht 6\' 1"  (1.854 m)   Wt 289 lb (131.1 kg)   BMI 38.13 kg/m   Specialty Comments:  No specialty comments available.  PMFS History: Patient Active Problem List   Diagnosis Date Noted  . Skin ulceration, limited to breakdown of skin (HCC) 11/27/2017  . 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 Diabetic polyneuropathy associated with type 2 diabetes mellitus (HCC)   . Osteomyelitis of right foot (HCC) 09/16/2017  . Diabetes mellitus type 2 in obese (HCC) 09/16/2017  . Normochromic normocytic anemia 09/16/2017  . Hyponatremia 09/16/2017  . Osteomyelitis (HCC) 09/16/2017  . Osteomyelitis of foot, right, acute (HCC) 09/16/2017   Past  Medical History:  Diagnosis Date  . Cataract   . Diabetes mellitus   . Sarcoidosis     Family History  Problem Relation Age of Onset  . Diabetes Maternal Aunt     Past Surgical History:  Procedure Laterality Date  . AMPUTATION Right 09/18/2017   Procedure: RIGHT FOOT 5TH RAY AMPUTATION;  Surgeon: [name], MD;  Location: [facility] OR;  Service: Orthopedics;  Laterality: Right;  . CATARACT EXTRACTION W/ INTRAOCULAR LENS  IMPLANT, BILATERAL     Social History   Occupational History  . Not on file  Tobacco Use  . Smoking status: Former Smoker    Packs/day: 0.00  .  Smokeless tobacco: Never Used  Substance and Sexual Activity  . Alcohol use: Not Currently    Comment: occasional  . Drug use: No  . Sexual activity: Yes

## 2017-12-05 NOTE — Telephone Encounter (Signed)
CMA spoke to patient to inform on lab results and advising.  Patient verified DOB. Patient understood.

## 2017-12-05 NOTE — Telephone Encounter (Signed)
-----   Message from Claiborne RiggZelda W Fleming, NP sent at 12/05/2017 10:13 AM EDT ----- Thyroid level is normal as well as your microalbumin which is a test performed to indicate if there is microscopic kidney damage from diabetes. Tests show increased cholesterol/lipid levels. Work on a low fat, heart healthy diet and participate in regular aerobic exercise program by working out at least 150 minutes per week; 5 days a week-30 minutes per day. Avoid red meat, fried foods. junk foods, sodas, sugary drinks, unhealthy snacking, alcohol and smoking.  Drink at least 48oz of water per day and monitor your carbohydrate intake daily.

## 2017-12-25 ENCOUNTER — Ambulatory Visit: Payer: Self-pay | Attending: Nurse Practitioner | Admitting: Nurse Practitioner

## 2017-12-25 ENCOUNTER — Encounter: Payer: Self-pay | Admitting: Nurse Practitioner

## 2017-12-25 VITALS — BP 126/85 | HR 91 | Temp 98.4°F | Ht 73.0 in | Wt 285.6 lb

## 2017-12-25 DIAGNOSIS — Z79899 Other long term (current) drug therapy: Secondary | ICD-10-CM | POA: Insufficient documentation

## 2017-12-25 DIAGNOSIS — Z794 Long term (current) use of insulin: Secondary | ICD-10-CM | POA: Insufficient documentation

## 2017-12-25 DIAGNOSIS — E119 Type 2 diabetes mellitus without complications: Secondary | ICD-10-CM

## 2017-12-25 DIAGNOSIS — D869 Sarcoidosis, unspecified: Secondary | ICD-10-CM | POA: Insufficient documentation

## 2017-12-25 DIAGNOSIS — Z888 Allergy status to other drugs, medicaments and biological substances status: Secondary | ICD-10-CM | POA: Insufficient documentation

## 2017-12-25 DIAGNOSIS — E1142 Type 2 diabetes mellitus with diabetic polyneuropathy: Secondary | ICD-10-CM

## 2017-12-25 DIAGNOSIS — R21 Rash and other nonspecific skin eruption: Secondary | ICD-10-CM

## 2017-12-25 LAB — POCT GLYCOSYLATED HEMOGLOBIN (HGB A1C): Hemoglobin A1C: 6 % — AB (ref 4.0–5.6)

## 2017-12-25 LAB — GLUCOSE, POCT (MANUAL RESULT ENTRY): POC Glucose: 130 mg/dl — AB (ref 70–99)

## 2017-12-25 MED ORDER — LISINOPRIL 5 MG PO TABS: 5.0000 mg | ORAL_TABLET | Freq: Every day | ORAL | 3 refills | Status: DC

## 2017-12-25 MED ORDER — TRIAMCINOLONE ACETONIDE 0.1 % EX CREA: 1.0000 "application " | TOPICAL_CREAM | Freq: Two times a day (BID) | CUTANEOUS | 1 refills | Status: DC

## 2017-12-25 MED ORDER — TRUEPLUS LANCETS 28G MISC: 1.0000 | Freq: Three times a day (TID) | 12 refills | Status: AC

## 2017-12-25 MED ORDER — ATORVASTATIN CALCIUM 20 MG PO TABS: 20.0000 mg | ORAL_TABLET | Freq: Every day | ORAL | 3 refills | Status: DC

## 2017-12-25 MED FILL — TRUEplus LANCETS 28G MISC: 33 days supply | Qty: 100 | Fill #0

## 2017-12-25 MED FILL — LISINOPRIL 5 MG TAB: 5 | 30 days supply | Qty: 30 | Fill #0

## 2017-12-25 MED FILL — ATORVASTATIN 20 MG TABLET: 20 | 30 days supply | Qty: 30 | Fill #0

## 2017-12-25 MED FILL — TRIAMCINOLONE ACETONIDE 0.1: 0.1 | 30 days supply | Qty: 60 | Fill #0

## 2017-12-25 NOTE — Patient Instructions (Signed)
Bedbugs Bedbugs are tiny bugs that live in and around beds. During the day, they stay hidden. At night, they come out and bite. Where are bedbugs found? Bedbugs can be found anywhere. It does not matter if a place is clean or dirty. They are often found in:  Hotels.  Shelters.  Dorms.  Hospitals.  Nursing homes.  Places where there are many birds or bats.  What are bedbug bites like? A bedbug bite leaves a small red bump with a darker red dot in the middle. The bump may show up soon after a person is bitten or a day or more later. Bedbug bites usually do not hurt, but they may itch. Most people do not need treatment for bedbug bites. The bumps usually go away on their own in a few days. How do I check for bedbugs? Bedbugs are reddish-brown, oval, and flat. They are very small and they cannot fly. Look for bedbugs in these places:  On mattresses, bed frames, headboards, and box springs.  On drapes and curtains in bedrooms.  Under the carpet in bedrooms.  Behind electrical outlets.  Behind any wallpaper that is peeling.  Inside luggage.  Also look for black or red spots or stains on or near the bed. What should I do if I find bedbugs? When Traveling Check your clothes, suitcase, and belongings for bedbugs before you go back home. You may want to throw away anything that has bedbugs on it. At Home Your bedroom may need to be treated by a pest control expert. You may also need to throw away mattresses or luggage. To help keep bedbugs from coming back, you may want to:  Put a plastic cover over your mattress.  Wash your clothes and bedding in water that is hotter than 120F (48.9C). Dry them on a hot setting.  Vacuum often around the bed and in all of the cracks where the bugs might hide.  Check all used furniture, bedding, or clothes that you bring into your home.  Get rid of bird nests and bat roosts that are near your home.  In Your Bed Try wearing pajamas that  have long sleeves and pant legs. Bedbugs usually bite areas of the skin that are not covered. This information is not intended to replace advice given to you by your health care provider. Make sure you discuss any questions you have with your health care provider. Document Released: 07/18/2010 Document Revised: 09/08/2015 Document Reviewed: 03/29/2014 Elsevier Interactive Patient Education  2018 Elsevier Inc.  

## 2017-12-25 NOTE — Progress Notes (Signed)
Assessment & Plan:  Ronnie Shaw was seen today for follow-up.  Diagnoses and all orders for this visit:  Controlled type 2 diabetes mellitus without complication, with long-term current use of insulin (HCC) -     Glucose (CBG) -     HgB A1c -     atorvastatin (LIPITOR) 20 MG tablet; Take 1 tablet (20 mg total) by mouth daily. -     lisinopril (PRINIVIL,ZESTRIL) 5 MG tablet; Take 1 tablet (5 mg total) by mouth daily. -     TRUEPLUS LANCETS 28G MISC; 1 each by Does not apply route 3 (three) times daily.  Continue blood sugar control as discussed in office today, low carbohydrate diet, and regular physical exercise as tolerated, 150 minutes per week (30 min each day, 5 days per week, or 50 min 3 days per week). Keep blood sugar logs with fasting goal of 90-130 mg/dl, post prandial (after you eat) less than 180.  For Hypoglycemia: BS <60 and Hyperglycemia BS >400; contact the clinic ASAP. Annual eye exams and foot exams are recommended.  Skin rash -     triamcinolone cream (KENALOG) 0.1 %; Apply 1 application topically 2 (two) times daily.    Patient has been counseled on age-appropriate routine health concerns for screening and prevention. These are reviewed and up-to-date. Referrals have been placed accordingly. Immunizations are up-to-date or declined.    Subjective:   Chief Complaint  Patient presents with  . Follow-up    Pt. is here for diabetes and to get his A1C.    HPI Ronnie Shaw 37 y.o. male presents to office today for follow up.    DM Type 2 A1c has dropped from 13 to 6.0!!!! He has lost 5lbs. He has not been taking his insulin as prescribed and reports monitoring his blood glucose levels sporadically. He has made significant changes in his diet. He denies any hypo or hyperglycemic symptoms. He is due for eye exam. Patient has been advised to apply for financial assistance and schedule to see our financial counselor.  Lab Results  Component Value Date   HGBA1C 6.0  (A) 12/25/2017   Rash Patient complains of skin rash involving various areas of his body. Rash started several weeks ago. Appearance of rash at onset: Texture of lesion(s): raised. Rash has currently resolved. Discomfort associated with rash: is pruritic.  Associated symptoms: none.  Patient has not had previous evaluation of rash. Patient has not had previous treatment. Patient has had contacts with similar rash. Reports he and his brother both notice skin rash after lying on the couch in the living room.  Patient has identified precipitant. Feels they may have bed bugs but is not certain.  Patient has not had new exposures (soaps, lotions, laundry detergents, foods, medications, plants, insects or animals.)   Review of Systems  Constitutional: Negative for fever, malaise/fatigue and weight loss.  HENT: Negative.  Negative for nosebleeds.   Eyes: Negative.  Negative for blurred vision, double vision and photophobia.  Respiratory: Negative.  Negative for cough and shortness of breath.   Cardiovascular: Negative.  Negative for chest pain, palpitations and leg swelling.  Gastrointestinal: Negative.  Negative for heartburn, nausea and vomiting.  Musculoskeletal: Negative.  Negative for myalgias.  Skin: Positive for rash.  Neurological: Negative.  Negative for dizziness, focal weakness, seizures and headaches.  Psychiatric/Behavioral: Negative.  Negative for suicidal ideas.    Past Medical History:  Diagnosis Date  . Cataract   . Diabetes mellitus   . Sarcoidosis  Past Surgical History:  Procedure Laterality Date  . AMPUTATION Right 09/18/2017   Procedure: RIGHT FOOT 5TH RAY AMPUTATION;  Surgeon: Newt Minion, MD;  Location: [facility];  Service: Orthopedics;  Laterality: Right;  . CATARACT EXTRACTION W/ INTRAOCULAR LENS  IMPLANT, BILATERAL      Family History  Problem Relation Age of Onset  . Diabetes Maternal Aunt     Social History Reviewed with no changes to be made today.    Outpatient Medications Prior to Visit  Medication Sig Dispense Refill  . Blood Glucose Monitoring Suppl (TRUE METRIX METER) w/Device KIT 1 each by Does not apply route 3 (three) times daily. 1 kit 0  . glucose blood (TRUE METRIX BLOOD GLUCOSE TEST) test strip Use as instructed 100 each 12  . Insulin Glargine (LANTUS SOLOSTAR) 100 UNIT/ML Solostar Pen Inject 10 Units into the skin daily. 3 mL 0  . ondansetron (ZOFRAN) 4 MG tablet Take 1 tablet (4 mg total) by mouth daily as needed for nausea or vomiting. 30 tablet 1  . atorvastatin (LIPITOR) 20 MG tablet Take 1 tablet (20 mg total) by mouth daily. 90 tablet 3  . lisinopril (PRINIVIL,ZESTRIL) 5 MG tablet Take 1 tablet (5 mg total) by mouth daily. 90 tablet 3  . TRUEPLUS LANCETS 28G MISC 1 each by Does not apply route 3 (three) times daily. 100 each 12   No facility-administered medications prior to visit.     Allergies  Allergen Reactions  . Prednisone Other (See Comments)    "makes me go blind", "that's how I got cataracts".       Objective:    BP 126/85 (BP Location: Right Arm, Patient Position: Sitting, Cuff Size: Large)   Pulse 91   Temp 98.4 F (36.9 C) (Oral)   Ht 6' 1" (1.854 m)   Wt 285 lb 9.6 oz (129.5 kg)   SpO2 100%   BMI 37.68 kg/m  Wt Readings from Last 3 Encounters:  12/25/17 285 lb 9.6 oz (129.5 kg)  12/05/17 289 lb (131.1 kg)  11/27/17 289 lb 9.6 oz (131.4 kg)    Physical Exam  Constitutional: He is oriented to person, place, and time. He appears well-developed and well-nourished. He is cooperative.  HENT:  Head: Normocephalic and atraumatic.  Eyes: EOM are normal.  Neck: Normal range of motion.  Cardiovascular: Normal rate, regular rhythm and normal heart sounds. Exam reveals no gallop and no friction rub.  No murmur heard. Pulmonary/Chest: Effort normal and breath sounds normal. No tachypnea. No respiratory distress. He has no decreased breath sounds. He has no wheezes. He has no rhonchi. He has no  rales. He exhibits no tenderness.  Abdominal: Bowel sounds are normal.  Musculoskeletal: Normal range of motion. He exhibits no edema.  Neurological: He is alert and oriented to person, place, and time. Coordination normal.  Skin: Skin is warm and dry. Rash noted. Rash is maculopapular.     Psychiatric: He has a normal mood and affect. His behavior is normal. Judgment and thought content normal.  Nursing note and vitals reviewed.      Patient has been counseled extensively about nutrition and exercise as well as the importance of adherence with medications and regular follow-up. The patient was given clear instructions to go to ER or return to medical center if symptoms don't improve, worsen or new problems develop. The patient verbalized understanding.   Follow-up: Return in about 3 months (around 03/26/2018) for DM.   Gildardo Pounds, FNP-BC [facility]  Health and [place], [place]   12/26/2017, 11:05 PM

## 2017-12-26 ENCOUNTER — Ambulatory Visit (INDEPENDENT_AMBULATORY_CARE_PROVIDER_SITE_OTHER): Payer: Self-pay | Admitting: Orthopedic Surgery

## 2017-12-26 ENCOUNTER — Encounter: Payer: Self-pay | Admitting: Nurse Practitioner

## 2018-01-02 ENCOUNTER — Emergency Department (HOSPITAL_BASED_OUTPATIENT_CLINIC_OR_DEPARTMENT_OTHER)
Admission: EM | Admit: 2018-01-02 | Discharge: 2018-01-02 | Disposition: A | Discharge status: Home / Self Care | Payer: Self-pay | Attending: Emergency Medicine | Admitting: Emergency Medicine

## 2018-01-02 ENCOUNTER — Emergency Department (HOSPITAL_BASED_OUTPATIENT_CLINIC_OR_DEPARTMENT_OTHER): Payer: Self-pay

## 2018-01-02 ENCOUNTER — Encounter (HOSPITAL_BASED_OUTPATIENT_CLINIC_OR_DEPARTMENT_OTHER): Payer: Self-pay

## 2018-01-02 ENCOUNTER — Other Ambulatory Visit: Payer: Self-pay

## 2018-01-02 DIAGNOSIS — Z87891 Personal history of nicotine dependence: Secondary | ICD-10-CM | POA: Insufficient documentation

## 2018-01-02 DIAGNOSIS — Z79899 Other long term (current) drug therapy: Secondary | ICD-10-CM | POA: Insufficient documentation

## 2018-01-02 DIAGNOSIS — E1142 Type 2 diabetes mellitus with diabetic polyneuropathy: Secondary | ICD-10-CM | POA: Insufficient documentation

## 2018-01-02 DIAGNOSIS — R1011 Right upper quadrant pain: Secondary | ICD-10-CM

## 2018-01-02 DIAGNOSIS — Z794 Long term (current) use of insulin: Secondary | ICD-10-CM | POA: Insufficient documentation

## 2018-01-02 LAB — COMPREHENSIVE METABOLIC PANEL
ALBUMIN: 4.6 g/dL (ref 3.5–5.0)
ALK PHOS: 86 U/L (ref 38–126)
ALT: 24 U/L (ref 0–44)
AST: 22 U/L (ref 15–41)
Anion gap: 10 (ref 5–15)
BILIRUBIN TOTAL: 0.4 mg/dL (ref 0.3–1.2)
BUN: 18 mg/dL (ref 6–20)
CALCIUM: 9.3 mg/dL (ref 8.9–10.3)
CO2: 25 mmol/L (ref 22–32)
CREATININE: 1.01 mg/dL (ref 0.61–1.24)
Chloride: 103 mmol/L (ref 98–111)
GFR calc Af Amer: >60 mL/min (ref >60)
GFR calc non Af Amer: >60 mL/min (ref >60)
GLUCOSE: 107 mg/dL — AB (ref 70–99)
Potassium: 4.5 mmol/L (ref 3.5–5.1)
Sodium: 138 mmol/L (ref 135–145)
TOTAL PROTEIN: 8.2 g/dL — AB (ref 6.5–8.1)

## 2018-01-02 LAB — URINALYSIS, MICROSCOPIC (REFLEX)

## 2018-01-02 LAB — CBC
HEMATOCRIT: 39.5 % (ref 39.0–52.0)
Hemoglobin: 13.5 g/dL (ref 13.0–17.0)
MCH: 29.2 pg (ref 26.0–34.0)
MCHC: 34.2 g/dL (ref 30.0–36.0)
MCV: 85.5 fL (ref 78.0–100.0)
PLATELETS: 255 10*3/uL (ref 150–400)
RBC: 4.62 MIL/uL (ref 4.22–5.81)
RDW: 13.5 % (ref 11.5–15.5)
WBC: 5.3 10*3/uL (ref 4.0–10.5)

## 2018-01-02 LAB — URINALYSIS, ROUTINE W REFLEX MICROSCOPIC
BILIRUBIN URINE: NEGATIVE
Glucose, UA: NEGATIVE mg/dL
Ketones, ur: NEGATIVE mg/dL
Leukocytes, UA: NEGATIVE
NITRITE: NEGATIVE
PROTEIN: NEGATIVE mg/dL
SPECIFIC GRAVITY, URINE: 1.01 (ref 1.005–1.030)
pH: 6.5 (ref 5.0–8.0)

## 2018-01-02 LAB — CBG MONITORING, ED: GLUCOSE-CAPILLARY: 96 mg/dL (ref 70–99)

## 2018-01-02 LAB — LIPASE, BLOOD: Lipase: 23 U/L (ref 11–51)

## 2018-01-02 MED ORDER — FAMOTIDINE 20 MG PO TABS
20.0000 mg | ORAL_TABLET | Freq: Once | ORAL | Status: AC
  Administered 2018-01-02: 20 mg via ORAL

## 2018-01-02 MED ORDER — FAMOTIDINE 20 MG PO TABS: 20.0000 mg | ORAL_TABLET | Freq: Two times a day (BID) | ORAL | 0 refills | Status: DC

## 2018-01-02 MED ORDER — FAMOTIDINE 20 MG PO TABS
ORAL_TABLET | ORAL | Status: AC
  Filled 2018-01-02: qty 1

## 2018-01-02 MED ORDER — SODIUM CHLORIDE 0.9 % IV BOLUS
500.0000 mL | Freq: Once | INTRAVENOUS | Status: AC
  Administered 2018-01-02: 500 mL via INTRAVENOUS

## 2018-01-02 MED ORDER — GI COCKTAIL ~~LOC~~
30.0000 mL | Freq: Once | ORAL | Status: AC
  Administered 2018-01-02: 30 mL via ORAL
  Filled 2018-01-02: qty 30

## 2018-01-02 NOTE — ED Provider Notes (Signed)
[facility] EMERGENCY DEPARTMENT Provider Note   CSN: [id number] Arrival date & time: 01/02/18  1725     History   Chief Complaint Chief Complaint  Patient presents with  . Abdominal Pain    HPI Ronnie Shaw is a 37 y.o. male.  HPI  Patient presents c/o ruq pain since 1am this morning that woke him from his sleep.  He had baked chicken for dinner and sushi for lunch.  He has had normal BMs for thelast 3  Days and hismost recent was today.  No urinary symptoms.  No fevers/rashes/nausea/vomiting/SOB/chest pain.  He has been able to ambulate without difficulty.  He has not eaten today but has been drinking regularly (water and a fruit juice) without any issues holding it down.  He has not had pain like this before, it does not radiate and he has found no aggravating/alleviating features.  Past Medical History:  Diagnosis Date  . Cataract   . Diabetes mellitus   . Sarcoidosis     Patient Active Problem List   Diagnosis Date Noted  . Skin ulceration, limited to breakdown of skin ([place]) 11/27/2017  . 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 Diabetic polyneuropathy associated with type 2 diabetes mellitus ([place])   . Osteomyelitis of right foot ([place]) 09/16/2017  . Diabetes mellitus type 2 in obese ([place]) 09/16/2017  . Normochromic normocytic anemia 09/16/2017  . Hyponatremia 09/16/2017  . Osteomyelitis ([place]) 09/16/2017  . Osteomyelitis of foot, right, acute ([facility]) 09/16/2017    Past Surgical History:  Procedure Laterality Date  . AMPUTATION Right 09/18/2017   Procedure: RIGHT FOOT 5TH RAY AMPUTATION;  Surgeon: Newt Minion, MD;  Location: [facility];  Service: Orthopedics;  Laterality: Right;  . CATARACT EXTRACTION W/ INTRAOCULAR LENS  IMPLANT, BILATERAL          Home Medications    Prior to Admission medications   Medication Sig Start Date End Date Taking? Authorizing Provider  atorvastatin (LIPITOR) 20 MG tablet Take 1  tablet (20 mg total) by mouth daily. 12/25/17   Gildardo Pounds, NP  Blood Glucose Monitoring Suppl (TRUE METRIX METER) w/Device KIT 1 each by Does not apply route 3 (three) times daily. 09/26/17   Argentina Donovan, PA-C  famotidine (PEPCID) 20 MG tablet Take 1 tablet (20 mg total) by mouth 2 (two) times daily. 01/02/18 02/01/18  Sherene Sires, DO  glucose blood (TRUE METRIX BLOOD GLUCOSE TEST) test strip Use as instructed 11/27/17   Gildardo Pounds, NP  Insulin Glargine (LANTUS SOLOSTAR) 100 UNIT/ML Solostar Pen Inject 10 Units into the skin daily. 11/05/17   Charlott Rakes, MD  lisinopril (PRINIVIL,ZESTRIL) 5 MG tablet Take 1 tablet (5 mg total) by mouth daily. 12/25/17   Gildardo Pounds, NP  ondansetron (ZOFRAN) 4 MG tablet Take 1 tablet (4 mg total) by mouth daily as needed for nausea or vomiting. 09/21/17 09/21/18  Danford, Suann Larry, MD  triamcinolone cream (KENALOG) 0.1 % Apply 1 application topically 2 (two) times daily. 12/25/17   Gildardo Pounds, NP  TRUEPLUS LANCETS 28G MISC 1 each by Does not apply route 3 (three) times daily. 12/25/17   Gildardo Pounds, NP    Family History Family History  Problem Relation Age of Onset  . Diabetes Maternal Aunt     Social History Social History   Tobacco Use  . Smoking status: Former Smoker    Packs/day: 0.00  . Smokeless tobacco: Never Used  Substance Use Topics  . Alcohol use: Not Currently    Comment: occasional  . Drug use: No     Allergies   Prednisone   Review of Systems Review of Systems  Constitutional: Positive for appetite change. Negative for chills, diaphoresis, fatigue and fever.  HENT: Negative for drooling, sore throat and trouble swallowing.   Respiratory: Negative for cough, choking, chest tightness and shortness of breath.   Cardiovascular: Negative for chest pain.  Gastrointestinal: Positive for abdominal pain. Negative for abdominal distention, constipation, diarrhea, nausea and vomiting.       RUQ pain claimed   Endocrine: Negative for polydipsia and polyuria.  Genitourinary: Negative for decreased urine volume, difficulty urinating, dysuria and flank pain.  Musculoskeletal: Negative.   Skin: Negative for color change, rash and wound.     Physical Exam Updated Vital Signs BP 140/83 (BP Location: Left Arm)   Pulse 82   Temp 98.5 F (36.9 C) (Oral)   Resp 14   Ht _0  (1.854 m)   Wt 127 kg   SpO2 100%   BMI 36.94 kg/m   Physical Exam  Constitutional: He appears well-developed and well-nourished.  Non-toxic appearance. He does not appear ill. No distress.  HENT:  Head: Normocephalic.  Eyes: No scleral icterus.  Cardiovascular: Normal rate.  Pulmonary/Chest: Effort normal.  Abdominal: Soft. Normal appearance. He exhibits no pulsatile liver, no ascites and no mass. There is no hepatosplenomegaly. There is tenderness in the right upper quadrant and epigastric area. There is no rigidity, no rebound, no guarding and negative Murphy's sign. No hernia.  Pain is localized to lower right rib cage anterioroly per patient on exam  Neurological: He is alert.  Skin: Skin is warm and dry. Capillary refill takes less than 2 seconds. No rash noted. He is not diaphoretic. No cyanosis.  Psychiatric: He has a normal mood and affect. His behavior is normal.  Vitals reviewed.    ED Treatments / Results  Labs (all labs ordered are listed, but only abnormal results are displayed) Labs Reviewed  COMPREHENSIVE METABOLIC PANEL - Abnormal; Notable for the following components:      Result Value   Glucose, Bld 107 (*)    Total Protein 8.2 (*)    All other components within normal limits  URINALYSIS, ROUTINE W REFLEX MICROSCOPIC - Abnormal; Notable for the following components:   Color, Urine STRAW (*)    Hgb urine dipstick TRACE (*)    All other components within normal limits  URINALYSIS, MICROSCOPIC (REFLEX) - Abnormal; Notable for the following components:   Bacteria, UA RARE (*)    Non Squamous  Epithelial PRESENT (*)    All other components within normal limits  LIPASE, BLOOD  CBC  CBG MONITORING, ED    EKG EKG Interpretation  Date/Time:  Thursday January 02 2018 18:27:53 EDT Ventricular Rate:  81 PR Interval:    QRS Duration: 104 QT Interval:  396 QTC Calculation: 460 R Axis:   45 Text Interpretation:  Sinus rhythm ST elev, probable normal early repol pattern No significant change since last tracing Confirmed by Deno Etienne [phone]) on 01/02/2018 6:55:53 PM   Radiology Dg Chest 2 View  Result Date: 01/02/2018 CLINICAL DATA:  Pain in the lower rib cage EXAM: CHEST - 2 VIEW COMPARISON:  08/17/2015 FINDINGS: Tiny right pleural effusion or thickening. No focal opacity. Normal heart size. No pneumothorax. IMPRESSION: Tiny right pleural effusion or pleural thickening. Electronically Signed   By: Donavan Foil M.D.   On:  01/02/2018 19:38    Procedures Procedures (including critical care time)  Medications Ordered in ED Medications  gi cocktail (Maalox,Lidocaine,Donnatal) (30 mLs Oral Given 01/02/18 1841)  sodium chloride 0.9 % bolus 500 mL ( Intravenous Stopped 01/02/18 1919)     Initial Impression / Assessment and Plan / ED Course  I have reviewed the triage vital signs and the nursing notes.  Pertinent labs & imaging results that were available during my care of the patient were reviewed by me and considered in my medical decision making (see chart for details).   Difficult IV stick, ordered CBG to r/o glycemic issues in this diabetic on lantus who hasn't been eating.  Willgive GI cocktail, order CT, IVF bolus, labwork  Some resolution of pain with GI cocktail.  CXR read as "tiny effusion on right" but patient without fevers and no respiratory symptoms.  This was shared with patient and we discussed higher suspicions this was GI related.  Patient agreed to d/c with pepcid and close pcp followup.  He understands and agrees with return precautions.  Final Clinical  Impressions(s) / ED Diagnoses   Final diagnoses:  Right upper quadrant abdominal pain    ED Discharge Orders         Ordered    famotidine (PEPCID) 20 MG tablet  2 times daily     01/02/18 2004           Sherene Sires, DO 01/02/18 2009    Deno Etienne, DO 01/02/18 2115

## 2018-01-02 NOTE — ED Triage Notes (Signed)
RUQ pain since last night, denies fever, denies n/v/d

## 2018-01-02 NOTE — Discharge Instructions (Addendum)
Please take the prescribed pepcid as it should help resolve your pain if this is related to gastric reflux.  You blood work was reassuring and it is good that you are able to drink fluids freely.  Please see your pcp in a week to discuss how you are progressing and if anything gets significantly worse you should come back to the ED for evaluation.

## 2018-01-03 MED FILL — FAMOTIDINE 20 MG TABLET: 20 | 30 days supply | Qty: 60 | Fill #0

## 2018-01-07 ENCOUNTER — Ambulatory Visit (INDEPENDENT_AMBULATORY_CARE_PROVIDER_SITE_OTHER): Payer: Self-pay | Admitting: Orthopedic Surgery

## 2018-01-07 ENCOUNTER — Encounter (INDEPENDENT_AMBULATORY_CARE_PROVIDER_SITE_OTHER): Payer: Self-pay | Admitting: Orthopedic Surgery

## 2018-01-07 VITALS — Ht 73.0 in | Wt 289.0 lb

## 2018-01-07 DIAGNOSIS — Z89421 Acquired absence of other right toe(s): Secondary | ICD-10-CM

## 2018-01-07 NOTE — Progress Notes (Signed)
Office Visit Note   Patient: Ronnie Shaw           Date of Birth: 02/08/1981           MRN: [id number] Visit Date: 01/07/2018              Requested by: Claiborne RiggFleming, [name] W, NP [address] [place], [place] [address] PCP: Claiborne RiggFleming, [name] W, NP  Chief Complaint  Patient presents with  . Right Foot - Wound Check      HPI: Patient is a 37 year old gentleman status post right foot fifth ray amputation.  Patient states that his foot feels stiff.  His hemoglobin A1c was running 13 now it is 6.  Assessment & Plan: Visit Diagnoses:  1. 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   Ortho Exam  Patient is alert, oriented, no adenopathy, well-dressed, normal affect, normal respiratory effort. .  Examination the surgical incision has healed nicely he does have some venous stasis swelling recommended that he wear his compression stockings.  He has dorsiflexion to neutral.  There is no redness no cellulitis no signs of infection he has avulsed his third toenail there is no signs of infection or exposed bone.  Imaging: No results found. No images are attached to the encounter.  Labs: Lab Results  Component Value Date   HGBA1C 6.0 (A) 12/25/2017   HGBA1C 13.0 (H) 09/16/2017   ESRSEDRATE 102 (H) 09/16/2017   REPTSTATUS 09/27/2017 FINAL 09/18/2017   GRAMSTAIN  09/18/2017    ABUNDANT WBC PRESENT,BOTH PMN AND MONONUCLEAR RARE GRAM POSITIVE COCCI RARE GRAM VARIABLE ROD Performed at [facility], [address] [place], [place] [address]    CULT  09/18/2017    MODERATE GROUP B STREP(S.AGALACTIAE)ISOLATED TESTING AGAINST S. AGALACTIAE NOT ROUTINELY PERFORMED DUE TO PREDICTABILITY OF AMP/PEN/VAN SUSCEPTIBILITY. ABUNDANT FINEGOLDIA MAGNA MODERATE ANAEROBIC GRAM  POSITIVE RODS      Lab Results  Component Value Date   ALBUMIN 4.6 01/02/2018   ALBUMIN 4.4 11/27/2017   ALBUMIN 3.8 12/18/2014    Body mass index is 38.13 kg/m.  Orders:  No orders of the defined types were placed in this encounter.  No orders of the defined types were placed in this encounter.    Procedures: No procedures performed  Clinical Data: No additional findings.  ROS:  All other systems negative, except as noted in the HPI. Review of Systems  Objective: Vital Signs: Ht 6\' 1"  (1.854 m)   Wt 289 lb (131.1 kg)   BMI 38.13 kg/m   Specialty Comments:  No specialty comments available.  PMFS History: Patient Active Problem List   Diagnosis Date Noted  . Skin ulceration, limited to breakdown of skin (HCC) 11/27/2017  . 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 Diabetic polyneuropathy associated with type 2 diabetes mellitus (HCC)   . Osteomyelitis of right foot (HCC) 09/16/2017  . Diabetes mellitus type 2 in obese (HCC) 09/16/2017  . Normochromic normocytic anemia 09/16/2017  . Hyponatremia 09/16/2017  . Osteomyelitis (HCC) 09/16/2017  . Osteomyelitis of foot, right, acute (HCC) 09/16/2017   Past Medical History:  Diagnosis Date  . Cataract   . Diabetes mellitus   . Sarcoidosis     Family History  Problem Relation Age of  Onset  . Diabetes Maternal Aunt     Past Surgical History:  Procedure Laterality Date  . AMPUTATION Right 09/18/2017   Procedure: RIGHT FOOT 5TH RAY AMPUTATION;  Surgeon: [name], MD;  Location: [facility] OR;  Service: Orthopedics;  Laterality: Right;  . CATARACT EXTRACTION W/ INTRAOCULAR LENS  IMPLANT, BILATERAL     Social History   Occupational History  . Not on file  Tobacco Use  . Smoking status: Former Smoker    Packs/day: 0.00  . Smokeless tobacco: Never Used  Substance and Sexual Activity  . Alcohol use: Not Currently    Comment: occasional  . Drug use: No  . Sexual  activity: Yes

## 2018-03-26 ENCOUNTER — Ambulatory Visit: Payer: Self-pay | Admitting: Nurse Practitioner

## 2018-04-25 ENCOUNTER — Ambulatory Visit: Payer: Self-pay | Admitting: Nurse Practitioner

## 2019-01-19 ENCOUNTER — Ambulatory Visit (INDEPENDENT_AMBULATORY_CARE_PROVIDER_SITE_OTHER): Payer: Self-pay

## 2019-01-19 ENCOUNTER — Ambulatory Visit (INDEPENDENT_AMBULATORY_CARE_PROVIDER_SITE_OTHER): Payer: Self-pay | Admitting: Orthopedic Surgery

## 2019-01-19 ENCOUNTER — Encounter: Payer: Self-pay | Admitting: Orthopedic Surgery

## 2019-01-19 VITALS — Ht 73.0 in | Wt 289.0 lb

## 2019-01-19 DIAGNOSIS — Z89421 Acquired absence of other right toe(s): Secondary | ICD-10-CM

## 2019-01-19 DIAGNOSIS — M869 Osteomyelitis, unspecified: Secondary | ICD-10-CM

## 2019-01-19 NOTE — Progress Notes (Signed)
Office Visit Note   Patient: Ronnie Shaw           Date of Birth: [birth year]           MRN: [id number] Visit Date: 01/19/2019              Requested by: Gildardo Pounds, NP [address] [place],  [place] [address] PCP: Gildardo Pounds, NP  Chief Complaint  Patient presents with  . Right Foot - Pain      HPI: Patient is a 38 year old gentleman who was seen for acute ulceration swelling pain drainage from the right foot fourth toe.  Patient is status post 1/5 ray amputation.  Patient also complains of swelling in the leg and foot.  Patient states his diabetes is under better control.  Assessment & Plan: Visit Diagnoses:  1. 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 Osteomyelitis of fourth toe of right foot ([place])     Plan: With the open ulcer and destructive bony changes of the fourth toe PIP joint recommend proceeding with a amputation of the fourth toe through the MTP joint.  Risks and benefits were discussed including recurrent infection or new infections.  Patient states he understands he states he would like to proceed with surgery next week we will set this up at his convenience.  Patient was given a prescription for double extra-large knee-high compression stockings to help with the pain or swelling.  Follow-Up Instructions: Return in about 2 weeks (around 02/02/2019).   Ortho Exam  Patient is alert, oriented, no adenopathy, well-dressed, normal affect, normal respiratory effort. Examination patient has an antalgic gait he has a good dorsalis pedis and posterior tibial pulse he does have venous stasis swelling with a calf circumference at 46 cm.  He has a wound over the PIP joint of the fourth toe with a necrotic wound bed.  The ulcer probes down to bone with destructive bony changes of the PIP joint.  There is no ascending cellulitis.  Imaging: Xr Foot 2 Views Right  Result Date: 01/19/2019 2 view radiographs of the right foot shows  destructive bony changes of the PIP joint fourth toe.  No images are attached to the encounter.  Labs: Lab Results  Component Value Date   HGBA1C 6.0 (A) 12/25/2017   HGBA1C 13.0 (H) 09/16/2017   ESRSEDRATE 102 (H) 09/16/2017   REPTSTATUS 09/27/2017 FINAL 09/18/2017   GRAMSTAIN  09/18/2017    ABUNDANT WBC PRESENT,BOTH PMN AND MONONUCLEAR RARE GRAM POSITIVE COCCI RARE GRAM VARIABLE ROD Performed at [facility], [place] [address], [place], [place] [address]    CULT  09/18/2017    MODERATE GROUP B STREP(S.AGALACTIAE)ISOLATED TESTING AGAINST S. AGALACTIAE NOT ROUTINELY PERFORMED DUE TO PREDICTABILITY OF AMP/PEN/VAN SUSCEPTIBILITY. ABUNDANT FINEGOLDIA MAGNA MODERATE ANAEROBIC GRAM POSITIVE RODS      Lab Results  Component Value Date   ALBUMIN 4.6 01/02/2018   ALBUMIN 4.4 11/27/2017   ALBUMIN 3.8 12/18/2014    Lab Results  Component Value Date   MG 2.2 09/18/2017   No results found for: VD25OH  No results found for: PREALBUMIN CBC EXTENDED Latest Ref Rng & Units 01/02/2018 11/27/2017 09/21/2017  WBC 4.0 - 10.5 K/uL 5.3 4.6 9.1  RBC 4.22 - 5.81 MIL/uL 4.62 4.56 3.64(L)  HGB 13.0 - 17.0 g/dL 13.5 13.3 10.2(L)  HCT 39.0 - 52.0 % 39.5 39.6 32.4(L)  PLT 150 - 400 K/uL 255 265 416(H)  NEUTROABS 1.7 -  7.7 K/uL - - -  LYMPHSABS 0.7 - 4.0 K/uL - - -     Body mass index is 38.13 kg/m.  Orders:  Orders Placed This Encounter  Procedures  . XR Foot 2 Views Right   No orders of the defined types were placed in this encounter.    Procedures: No procedures performed  Clinical Data: No additional findings.  ROS:  All other systems negative, except as noted in the HPI. Review of Systems  Objective: Vital Signs: Ht 6\' 1"  (1.854 m)   Wt 289 lb (131.1 kg)   BMI 38.13 kg/m   Specialty Comments:  No specialty comments available.  PMFS History: Patient Active Problem List   Diagnosis Date Noted  . Skin ulceration, limited to breakdown of skin (HCC) 11/27/2017   . 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 Diabetic polyneuropathy associated with type 2 diabetes mellitus (HCC)   . Osteomyelitis of right foot (HCC) 09/16/2017  . Diabetes mellitus type 2 in obese (HCC) 09/16/2017  . Normochromic normocytic anemia 09/16/2017  . Hyponatremia 09/16/2017  . Osteomyelitis (HCC) 09/16/2017  . Osteomyelitis of foot, right, acute (HCC) 09/16/2017   Past Medical History:  Diagnosis Date  . Cataract   . Diabetes mellitus   . Sarcoidosis     Family History  Problem Relation Age of Onset  . Diabetes Maternal Aunt     Past Surgical History:  Procedure Laterality Date  . AMPUTATION Right 09/18/2017   Procedure: RIGHT FOOT 5TH RAY AMPUTATION;  Surgeon: 11/18/2017, MD;  Location: [facility] OR;  Service: Orthopedics;  Laterality: Right;  . CATARACT EXTRACTION W/ INTRAOCULAR LENS  IMPLANT, BILATERAL     Social History   Occupational History  . Not on file  Tobacco Use  . Smoking status: Former Smoker    Packs/day: 0.00  . Smokeless tobacco: Never Used  Substance and Sexual Activity  . Alcohol use: Not Currently    Comment: occasional  . Drug use: No  . Sexual activity: Yes

## 2019-01-26 ENCOUNTER — Other Ambulatory Visit: Payer: Self-pay

## 2019-01-26 ENCOUNTER — Other Ambulatory Visit (HOSPITAL_COMMUNITY)
Admission: RE | Admit: 2019-01-26 | Discharge: 2019-01-26 | Disposition: A | Discharge status: Home / Self Care | Payer: Self-pay | Source: Ambulatory Visit | Attending: Orthopedic Surgery | Admitting: Orthopedic Surgery

## 2019-01-26 ENCOUNTER — Encounter (HOSPITAL_COMMUNITY): Payer: Self-pay

## 2019-01-26 DIAGNOSIS — Z20828 Contact with and (suspected) exposure to other viral communicable diseases: Secondary | ICD-10-CM | POA: Insufficient documentation

## 2019-01-26 DIAGNOSIS — Z01812 Encounter for preprocedural laboratory examination: Secondary | ICD-10-CM | POA: Insufficient documentation

## 2019-01-27 ENCOUNTER — Encounter (HOSPITAL_COMMUNITY): Payer: Self-pay | Admitting: Certified Registered Nurse Anesthetist

## 2019-01-27 LAB — SARS CORONAVIRUS 2 (TAT 6-24 HRS): SARS Coronavirus 2: NEGATIVE

## 2019-01-27 MED ORDER — DEXTROSE 5 % IV SOLN
3.0000 g | INTRAVENOUS | Status: AC
  Administered 2019-01-28: 3 g via INTRAVENOUS
  Filled 2019-01-27: qty 3
  Filled 2019-01-27: qty 3000

## 2019-01-28 ENCOUNTER — Ambulatory Visit (HOSPITAL_COMMUNITY)
Admission: RE | Admit: 2019-01-28 | Discharge: 2019-01-28 | Disposition: A | Discharge status: Home / Self Care | Payer: Self-pay | Attending: Orthopedic Surgery | Admitting: Orthopedic Surgery

## 2019-01-28 ENCOUNTER — Encounter (HOSPITAL_COMMUNITY): Payer: Self-pay

## 2019-01-28 ENCOUNTER — Ambulatory Visit (HOSPITAL_COMMUNITY): Payer: Self-pay | Admitting: Certified Registered Nurse Anesthetist

## 2019-01-28 ENCOUNTER — Other Ambulatory Visit: Payer: Self-pay

## 2019-01-28 ENCOUNTER — Encounter (HOSPITAL_COMMUNITY)
Admission: RE | Disposition: A | Discharge status: Home / Self Care | Payer: Self-pay | Source: Home / Self Care | Attending: Orthopedic Surgery

## 2019-01-28 DIAGNOSIS — K219 Gastro-esophageal reflux disease without esophagitis: Secondary | ICD-10-CM | POA: Insufficient documentation

## 2019-01-28 DIAGNOSIS — I10 Essential (primary) hypertension: Secondary | ICD-10-CM | POA: Insufficient documentation

## 2019-01-28 DIAGNOSIS — Z79899 Other long term (current) drug therapy: Secondary | ICD-10-CM | POA: Insufficient documentation

## 2019-01-28 DIAGNOSIS — E669 Obesity, unspecified: Secondary | ICD-10-CM | POA: Insufficient documentation

## 2019-01-28 DIAGNOSIS — Z794 Long term (current) use of insulin: Secondary | ICD-10-CM | POA: Insufficient documentation

## 2019-01-28 DIAGNOSIS — Z6838 Body mass index (BMI) 38.0-38.9, adult: Secondary | ICD-10-CM | POA: Insufficient documentation

## 2019-01-28 DIAGNOSIS — M86271 Subacute osteomyelitis, right ankle and foot: Secondary | ICD-10-CM

## 2019-01-28 DIAGNOSIS — E1169 Type 2 diabetes mellitus with other specified complication: Secondary | ICD-10-CM | POA: Insufficient documentation

## 2019-01-28 DIAGNOSIS — M869 Osteomyelitis, unspecified: Secondary | ICD-10-CM | POA: Insufficient documentation

## 2019-01-28 DIAGNOSIS — Z87891 Personal history of nicotine dependence: Secondary | ICD-10-CM | POA: Insufficient documentation

## 2019-01-28 DIAGNOSIS — D869 Sarcoidosis, unspecified: Secondary | ICD-10-CM | POA: Insufficient documentation

## 2019-01-28 HISTORY — PX: AMPUTATION: SHX166

## 2019-01-28 HISTORY — DX: Unspecified asthma, uncomplicated: J45.909

## 2019-01-28 HISTORY — DX: Depression, unspecified: F32.A

## 2019-01-28 LAB — COMPREHENSIVE METABOLIC PANEL
ALT: 23 U/L (ref 0–44)
AST: 19 U/L (ref 15–41)
Albumin: 3 g/dL — ABNORMAL LOW (ref 3.5–5.0)
Alkaline Phosphatase: 63 U/L (ref 38–126)
Anion gap: 9 (ref 5–15)
BUN: 19 mg/dL (ref 6–20)
CO2: 24 mmol/L (ref 22–32)
Calcium: 8.8 mg/dL — ABNORMAL LOW (ref 8.9–10.3)
Chloride: 105 mmol/L (ref 98–111)
Creatinine, Ser: 1.28 mg/dL — ABNORMAL HIGH (ref 0.61–1.24)
GFR calc Af Amer: >60 mL/min (ref >60)
GFR calc non Af Amer: >60 mL/min (ref >60)
Glucose, Bld: 127 mg/dL — ABNORMAL HIGH (ref 70–99)
Potassium: 4.3 mmol/L (ref 3.5–5.1)
Sodium: 138 mmol/L (ref 135–145)
Total Bilirubin: 0.5 mg/dL (ref 0.3–1.2)
Total Protein: 7.4 g/dL (ref 6.5–8.1)

## 2019-01-28 LAB — CBC
HCT: 32 % — ABNORMAL LOW (ref 39.0–52.0)
Hemoglobin: 10.4 g/dL — ABNORMAL LOW (ref 13.0–17.0)
MCH: 29.5 pg (ref 26.0–34.0)
MCHC: 32.5 g/dL (ref 30.0–36.0)
MCV: 90.9 fL (ref 80.0–100.0)
Platelets: 397 10*3/uL (ref 150–400)
RBC: 3.52 MIL/uL — ABNORMAL LOW (ref 4.22–5.81)
RDW: 13.6 % (ref 11.5–15.5)
WBC: 9.3 10*3/uL (ref 4.0–10.5)
nRBC: 0 % (ref 0.0–0.2)

## 2019-01-28 LAB — GLUCOSE, CAPILLARY
Glucose-Capillary: 120 mg/dL — ABNORMAL HIGH (ref 70–99)
Glucose-Capillary: 143 mg/dL — ABNORMAL HIGH (ref 70–99)

## 2019-01-28 SURGERY — AMPUTATION DIGIT
Anesthesia: General | Laterality: Right

## 2019-01-28 MED ORDER — PROMETHAZINE HCL 25 MG/ML IJ SOLN: 6.2500 mg | INTRAMUSCULAR | Status: DC | PRN

## 2019-01-28 MED ORDER — ONDANSETRON HCL 4 MG/2ML IJ SOLN
INTRAMUSCULAR | Status: DC | PRN
  Administered 2019-01-28: 4 mg via INTRAVENOUS

## 2019-01-28 MED ORDER — ACETAMINOPHEN 500 MG PO TABS
1000.0000 mg | ORAL_TABLET | Freq: Once | ORAL | Status: AC
  Administered 2019-01-28: 1000 mg via ORAL
  Filled 2019-01-28: qty 2

## 2019-01-28 MED ORDER — MIDAZOLAM HCL 5 MG/5ML IJ SOLN
INTRAMUSCULAR | Status: DC | PRN
  Administered 2019-01-28: 2 mg via INTRAVENOUS

## 2019-01-28 MED ORDER — LACTATED RINGERS IV SOLN
INTRAVENOUS | Status: DC | PRN
  Administered 2019-01-28: 08:00:00 via INTRAVENOUS

## 2019-01-28 MED ORDER — FENTANYL CITRATE (PF) 100 MCG/2ML IJ SOLN: 25.0000 ug | INTRAMUSCULAR | Status: DC | PRN

## 2019-01-28 MED ORDER — PROPOFOL 10 MG/ML IV BOLUS
INTRAVENOUS | Status: DC | PRN
  Administered 2019-01-28: 200 mg via INTRAVENOUS

## 2019-01-28 MED ORDER — LIDOCAINE 2% (20 MG/ML) 5 ML SYRINGE
INTRAMUSCULAR | Status: DC | PRN
  Administered 2019-01-28: 100 mg via INTRAVENOUS

## 2019-01-28 MED ORDER — FENTANYL CITRATE (PF) 100 MCG/2ML IJ SOLN
INTRAMUSCULAR | Status: DC | PRN
  Administered 2019-01-28 (×2): 25 ug via INTRAVENOUS

## 2019-01-28 MED ORDER — PROPOFOL 10 MG/ML IV BOLUS
INTRAVENOUS | Status: AC
  Filled 2019-01-28: qty 40

## 2019-01-28 MED ORDER — OXYCODONE-ACETAMINOPHEN 5-325 MG PO TABS: 1.0000 | ORAL_TABLET | ORAL | 0 refills | Status: DC | PRN

## 2019-01-28 MED ORDER — MIDAZOLAM HCL 2 MG/2ML IJ SOLN
INTRAMUSCULAR | Status: AC
  Filled 2019-01-28: qty 2

## 2019-01-28 MED ORDER — 0.9 % SODIUM CHLORIDE (POUR BTL) OPTIME
TOPICAL | Status: DC | PRN
  Administered 2019-01-28: 1000 mL

## 2019-01-28 MED ORDER — FENTANYL CITRATE (PF) 250 MCG/5ML IJ SOLN
INTRAMUSCULAR | Status: AC
  Filled 2019-01-28: qty 5

## 2019-01-28 MED ORDER — CHLORHEXIDINE GLUCONATE 4 % EX LIQD: 60.0000 mL | Freq: Once | CUTANEOUS | Status: DC

## 2019-01-28 SURGICAL SUPPLY — 30 items
BLADE SURG 21 STRL SS (BLADE) ×3 IMPLANT
BNDG COHESIVE 4X5 TAN STRL (GAUZE/BANDAGES/DRESSINGS) ×3 IMPLANT
BNDG COHESIVE 6X5 TAN STRL LF (GAUZE/BANDAGES/DRESSINGS) ×3 IMPLANT
BNDG ESMARK 4X9 LF (GAUZE/BANDAGES/DRESSINGS) IMPLANT
BNDG GAUZE ELAST 4 BULKY (GAUZE/BANDAGES/DRESSINGS) ×3 IMPLANT
COVER SURGICAL LIGHT HANDLE (MISCELLANEOUS) ×6 IMPLANT
COVER WAND RF STERILE (DRAPES) ×3 IMPLANT
DRAPE U-SHAPE 47X51 STRL (DRAPES) ×3 IMPLANT
DRSG ADAPTIC 3X8 NADH LF (GAUZE/BANDAGES/DRESSINGS) IMPLANT
DRSG EMULSION OIL 3X3 NADH (GAUZE/BANDAGES/DRESSINGS) ×3 IMPLANT
DRSG PAD ABDOMINAL 8X10 ST (GAUZE/BANDAGES/DRESSINGS) ×3 IMPLANT
DURAPREP 26ML APPLICATOR (WOUND CARE) ×3 IMPLANT
ELECT REM PT RETURN 9FT ADLT (ELECTROSURGICAL) ×3
ELECTRODE REM PT RTRN 9FT ADLT (ELECTROSURGICAL) ×1 IMPLANT
GAUZE SPONGE 4X4 12PLY STRL (GAUZE/BANDAGES/DRESSINGS) ×3 IMPLANT
GLOVE BIOGEL PI IND STRL 9 (GLOVE) ×1 IMPLANT
GLOVE BIOGEL PI INDICATOR 9 (GLOVE) ×2
GLOVE SURG ORTHO 9.0 STRL STRW (GLOVE) ×3 IMPLANT
GOWN STRL REUS W/ TWL XL LVL3 (GOWN DISPOSABLE) ×2 IMPLANT
GOWN STRL REUS W/TWL XL LVL3 (GOWN DISPOSABLE) ×4
KIT BASIN OR (CUSTOM PROCEDURE TRAY) ×3 IMPLANT
KIT TURNOVER KIT B (KITS) ×3 IMPLANT
MANIFOLD NEPTUNE II (INSTRUMENTS) ×3 IMPLANT
NEEDLE 22X1 1/2 (OR ONLY) (NEEDLE) IMPLANT
NS IRRIG 1000ML POUR BTL (IV SOLUTION) ×3 IMPLANT
PACK ORTHO EXTREMITY (CUSTOM PROCEDURE TRAY) ×3 IMPLANT
PAD ARMBOARD 7.5X6 YLW CONV (MISCELLANEOUS) ×6 IMPLANT
SUT ETHILON 2 0 PSLX (SUTURE) ×3 IMPLANT
SYR CONTROL 10ML LL (SYRINGE) IMPLANT
TOWEL GREEN STERILE (TOWEL DISPOSABLE) ×3 IMPLANT

## 2019-01-28 NOTE — Transfer of Care (Signed)
Immediate Anesthesia Transfer of Care Note  Patient: Ronnie Shaw  Procedure(s) Performed: RIGHT FOURTH TOE AMPUTATION (Right )  Patient Location: PACU  Anesthesia Type:General  Level of Consciousness: awake, alert  and oriented  Airway & Oxygen Therapy: Patient Spontanous Breathing and Patient connected to face mask oxygen  Post-op Assessment: Report given to RN and Post -op Vital signs reviewed and stable  Post vital signs: Reviewed and stable  Last Vitals:  Vitals Value Taken Time  BP 153/83 01/28/19 0848  Temp    Pulse 92 01/28/19 0851  Resp 32 01/28/19 0851  SpO2 96 % 01/28/19 0851  Vitals shown include unvalidated device data.  Last Pain:  Vitals:   01/28/19 0724  TempSrc:   PainSc: 0-No pain      Patients Stated Pain Goal: 5 (66/59/93 5701)  Complications: No apparent anesthesia complications

## 2019-01-28 NOTE — Anesthesia Procedure Notes (Signed)
Procedure Name: LMA Insertion Date/Time: 01/28/2019 8:21 AM Performed by: Candis Shine, CRNA Pre-anesthesia Checklist: Patient identified, Emergency Drugs available, Suction available and Patient being monitored Patient Re-evaluated:Patient Re-evaluated prior to induction Oxygen Delivery Method: Circle System Utilized Preoxygenation: Pre-oxygenation with 100% oxygen Induction Type: IV induction LMA: LMA inserted LMA Size: 5.0 Number of attempts: 1 Placement Confirmation: positive ETCO2 Tube secured with: Tape Dental Injury: Teeth and Oropharynx as per pre-operative assessment

## 2019-01-28 NOTE — Anesthesia Preprocedure Evaluation (Addendum)
Anesthesia Evaluation  Patient identified by MRN, date of birth, ID band Patient awake    Reviewed: Allergy & Precautions, NPO status , Patient's Chart, lab work & pertinent test results  Airway Mallampati: II  TM Distance: >3 FB Neck ROM: Full    Dental  (+) Dental Advisory Given, Poor Dentition   Pulmonary asthma , Patient abstained from smoking., former smoker,    Pulmonary exam normal breath sounds clear to auscultation       Cardiovascular hypertension, Pt. on medications Normal cardiovascular exam Rhythm:Regular Rate:Normal     Neuro/Psych PSYCHIATRIC DISORDERS Depression  Neuromuscular disease    GI/Hepatic Neg liver ROS, GERD  Medicated,  Endo/Other  diabetes, Type 2, Insulin DependentObesity Sarcoidosis   Renal/GU negative Renal ROS     Musculoskeletal Osteomyelitis right 4th toe   Abdominal   Peds  Hematology negative hematology ROS (+)   Anesthesia Other Findings Day of surgery medications reviewed with the patient.  Reproductive/Obstetrics                            Anesthesia Physical Anesthesia Plan  ASA: III  Anesthesia Plan: General   Post-op Pain Management:    Induction: Intravenous  PONV Risk Score and Plan: 2 and Midazolam and Ondansetron  Airway Management Planned: LMA  Additional Equipment:   Intra-op Plan:   Post-operative Plan: Extubation in OR  Informed Consent: I have reviewed the patients History and Physical, chart, labs and discussed the procedure including the risks, benefits and alternatives for the proposed anesthesia with the patient or authorized representative who has indicated his/her understanding and acceptance.     Dental advisory given  Plan Discussed with: CRNA  Anesthesia Plan Comments:         Anesthesia Quick Evaluation

## 2019-01-28 NOTE — Op Note (Signed)
01/28/2019  8:45 AM  PATIENT:  Alleen Borne    PRE-OPERATIVE DIAGNOSIS:  Osteomyelitis Right FOURTH Toe  POST-OPERATIVE DIAGNOSIS:  Same  PROCEDURE:  RIGHT FOURTH TOE AMPUTATION  SURGEON:  Newt Minion, MD  PHYSICIAN ASSISTANT:None ANESTHESIA:   General  PREOPERATIVE INDICATIONS:  Augustus Zurawski is a  38 y.o. male with a diagnosis of Osteomyelitis Right FOURTH Toe who failed conservative measures and elected for surgical management.    The risks benefits and alternatives were discussed with the patient preoperatively including but not limited to the risks of infection, bleeding, nerve injury, cardiopulmonary complications, the need for revision surgery, among others, and the patient was willing to proceed.  OPERATIVE IMPLANTS: none  @ENCIMAGES @  OPERATIVE FINDINGS: abscess and necrotic bone  OPERATIVE PROCEDURE: Patient was brought the operating room underwent a general anesthetic.  After adequate levels anesthesia were obtained patient's right lower extremity was prepped using DuraPrep draped into a sterile field a timeout was called.  A fishmouth incision was made just distal to the MTP joint right fourth toe.  The toe was amputated through the MTP joint.  Within the toe the bone was necrotic and there was an abscess.  Electrocautery was used for hemostasis the wound was irrigated with 1 L normal saline.  2-0 nylon was used for wound closure and a sterile dressing was applied patient was extubated taken the PACU in stable condition.   DISCHARGE PLANNING:  Antibiotic duration: Preoperative antibiotics  Weightbearing: Touchdown weightbearing on the right  Pain medication: Prescription for Percocet  Dressing care/ Wound VAC: Follow-up in 1 week to change the dressing  Ambulatory devices: Crutches  Discharge to: Home.  Follow-up: In the office 1 week post operative.
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
## 2019-01-29 ENCOUNTER — Encounter (HOSPITAL_COMMUNITY): Payer: Self-pay | Admitting: Orthopedic Surgery

## 2019-01-29 NOTE — Anesthesia Postprocedure Evaluation (Signed)
Anesthesia Post Note  Patient: Ronnie Shaw  Procedure(s) Performed: RIGHT FOURTH TOE AMPUTATION (Right )     Patient location during evaluation: PACU Anesthesia Type: General Level of consciousness: awake and alert Pain management: pain level controlled Vital Signs Assessment: post-procedure vital signs reviewed and stable Respiratory status: spontaneous breathing, nonlabored ventilation and respiratory function stable Cardiovascular status: blood pressure returned to baseline and stable Postop Assessment: no apparent nausea or vomiting Anesthetic complications: no    Last Vitals:  Vitals:   01/28/19 0955 01/28/19 1030  BP: (!) 157/92 (!) 146/95  Pulse: 80 83  Resp: 18 16  Temp:  (!) 36.2 C  SpO2: 97% 98%    Last Pain:  Vitals:   01/28/19 1030  TempSrc:   PainSc: Asleep                 Catalina Gravel

## 2019-02-02 ENCOUNTER — Ambulatory Visit (INDEPENDENT_AMBULATORY_CARE_PROVIDER_SITE_OTHER): Payer: Self-pay | Admitting: Orthopedic Surgery

## 2019-02-02 ENCOUNTER — Encounter: Payer: Self-pay | Admitting: Orthopedic Surgery

## 2019-02-02 ENCOUNTER — Other Ambulatory Visit: Payer: Self-pay

## 2019-02-02 VITALS — Ht 73.0 in | Wt 289.0 lb

## 2019-02-02 DIAGNOSIS — M869 Osteomyelitis, unspecified: Secondary | ICD-10-CM

## 2019-02-03 ENCOUNTER — Encounter: Payer: Self-pay | Admitting: Orthopedic Surgery
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
## 2019-02-10 ENCOUNTER — Ambulatory Visit: Payer: Self-pay | Admitting: Family

## 2019-02-11 ENCOUNTER — Ambulatory Visit: Payer: Self-pay | Admitting: Family

## 2019-02-18 ENCOUNTER — Other Ambulatory Visit: Payer: Self-pay

## 2019-02-18 ENCOUNTER — Ambulatory Visit (INDEPENDENT_AMBULATORY_CARE_PROVIDER_SITE_OTHER): Payer: Self-pay | Admitting: Family

## 2019-02-18 ENCOUNTER — Encounter: Payer: Self-pay | Admitting: Family

## 2019-02-18 VITALS — Ht 73.0 in | Wt 289.0 lb

## 2019-02-18 DIAGNOSIS — Z89421 Acquired absence of other right toe(s): Secondary | ICD-10-CM

## 2019-02-18 MED ORDER — GABAPENTIN 100 MG PO CAPS: 100.0000 mg | ORAL_CAPSULE | Freq: Every day | ORAL | 0 refills | Status: DC
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
## 2019-04-06 ENCOUNTER — Encounter (HOSPITAL_COMMUNITY): Payer: Self-pay | Admitting: Orthopedic Surgery

## 2019-04-06 ENCOUNTER — Other Ambulatory Visit: Payer: Self-pay

## 2019-04-06 ENCOUNTER — Encounter: Payer: Self-pay | Admitting: Orthopedic Surgery

## 2019-04-06 ENCOUNTER — Other Ambulatory Visit: Payer: Self-pay | Admitting: Physician Assistant

## 2019-04-06 ENCOUNTER — Ambulatory Visit (INDEPENDENT_AMBULATORY_CARE_PROVIDER_SITE_OTHER): Payer: Self-pay | Admitting: Orthopedic Surgery

## 2019-04-06 DIAGNOSIS — M86271 Subacute osteomyelitis, right ankle and foot: Secondary | ICD-10-CM

## 2019-04-06 DIAGNOSIS — Z89421 Acquired absence of other right toe(s): Secondary | ICD-10-CM
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
## 2019-04-06 NOTE — Progress Notes (Signed)
Spoke with pt for pre-op call. Pt denies HTN and cardiac history. Pt is a type 2 diabetic. Pt states his last A1C was several months ago and he was told it was in the "pre-diabetes" range. He is not taking any diabetic medications at this time. Pt states his blood sugar is usually around the 130's. Instructed pt to check his blood sugar in the AM when he gets up and every 2 hours until he leaves for the hospital. If blood sugar is 70 or below, treat with 1/2 cup of clear juice (apple or cranberry) and recheck blood sugar 15 minutes after drinking juice. If blood sugar continues to be 70 or below, call the Short Stay department and ask to speak to a nurse. Pt voiced understanding.  Pt will arrive 3 hours prior to surgery because he needs a Covid test done.

## 2019-04-07 ENCOUNTER — Ambulatory Visit (HOSPITAL_COMMUNITY)
Admission: RE | Admit: 2019-04-07 | Discharge: 2019-04-07 | Disposition: A | Discharge status: Home / Self Care | Payer: Self-pay | Attending: Orthopedic Surgery | Admitting: Orthopedic Surgery

## 2019-04-07 ENCOUNTER — Ambulatory Visit (HOSPITAL_COMMUNITY): Payer: Self-pay | Admitting: Certified Registered Nurse Anesthetist

## 2019-04-07 ENCOUNTER — Other Ambulatory Visit: Payer: Self-pay

## 2019-04-07 ENCOUNTER — Encounter (HOSPITAL_COMMUNITY): Payer: Self-pay | Admitting: Orthopedic Surgery

## 2019-04-07 ENCOUNTER — Encounter (HOSPITAL_COMMUNITY)
Admission: RE | Disposition: A | Discharge status: Home / Self Care | Payer: Self-pay | Source: Home / Self Care | Attending: Orthopedic Surgery

## 2019-04-07 DIAGNOSIS — D869 Sarcoidosis, unspecified: Secondary | ICD-10-CM | POA: Insufficient documentation

## 2019-04-07 DIAGNOSIS — Z20828 Contact with and (suspected) exposure to other viral communicable diseases: Secondary | ICD-10-CM | POA: Insufficient documentation

## 2019-04-07 DIAGNOSIS — M86271 Subacute osteomyelitis, right ankle and foot: Secondary | ICD-10-CM

## 2019-04-07 DIAGNOSIS — E1169 Type 2 diabetes mellitus with other specified complication: Secondary | ICD-10-CM | POA: Insufficient documentation

## 2019-04-07 DIAGNOSIS — Z794 Long term (current) use of insulin: Secondary | ICD-10-CM | POA: Insufficient documentation

## 2019-04-07 DIAGNOSIS — M86171 Other acute osteomyelitis, right ankle and foot: Secondary | ICD-10-CM

## 2019-04-07 DIAGNOSIS — G4730 Sleep apnea, unspecified: Secondary | ICD-10-CM | POA: Insufficient documentation

## 2019-04-07 DIAGNOSIS — M869 Osteomyelitis, unspecified: Secondary | ICD-10-CM | POA: Insufficient documentation

## 2019-04-07 DIAGNOSIS — Z87891 Personal history of nicotine dependence: Secondary | ICD-10-CM | POA: Insufficient documentation

## 2019-04-07 HISTORY — PX: AMPUTATION: SHX166

## 2019-04-07 HISTORY — DX: Pneumonia, unspecified organism: J18.9

## 2019-04-07 HISTORY — DX: Sleep apnea, unspecified: G47.30

## 2019-04-07 LAB — BASIC METABOLIC PANEL
Anion gap: 10 (ref 5–15)
BUN: 21 mg/dL — ABNORMAL HIGH (ref 6–20)
CO2: 22 mmol/L (ref 22–32)
Calcium: 8.6 mg/dL — ABNORMAL LOW (ref 8.9–10.3)
Chloride: 103 mmol/L (ref 98–111)
Creatinine, Ser: 1.27 mg/dL — ABNORMAL HIGH (ref 0.61–1.24)
GFR calc Af Amer: >60 mL/min (ref >60)
GFR calc non Af Amer: >60 mL/min (ref >60)
Glucose, Bld: 145 mg/dL — ABNORMAL HIGH (ref 70–99)
Potassium: 4.8 mmol/L (ref 3.5–5.1)
Sodium: 135 mmol/L (ref 135–145)

## 2019-04-07 LAB — CBC
HCT: 31.1 % — ABNORMAL LOW (ref 39.0–52.0)
Hemoglobin: 9.8 g/dL — ABNORMAL LOW (ref 13.0–17.0)
MCH: 28.2 pg (ref 26.0–34.0)
MCHC: 31.5 g/dL (ref 30.0–36.0)
MCV: 89.6 fL (ref 80.0–100.0)
Platelets: 363 10*3/uL (ref 150–400)
RBC: 3.47 MIL/uL — ABNORMAL LOW (ref 4.22–5.81)
RDW: 14.7 % (ref 11.5–15.5)
WBC: 9 10*3/uL (ref 4.0–10.5)
nRBC: 0 % (ref 0.0–0.2)

## 2019-04-07 LAB — GLUCOSE, CAPILLARY
Glucose-Capillary: 128 mg/dL — ABNORMAL HIGH (ref 70–99)
Glucose-Capillary: 121 mg/dL — ABNORMAL HIGH (ref 70–99)
Glucose-Capillary: 121 mg/dL — ABNORMAL HIGH (ref 70–99)

## 2019-04-07 LAB — RESPIRATORY PANEL BY RT PCR (FLU A&B, COVID)
Influenza A by PCR: NEGATIVE
Influenza B by PCR: NEGATIVE
SARS Coronavirus 2 by RT PCR: NEGATIVE

## 2019-04-07 LAB — HEMOGLOBIN A1C
Hgb A1c MFr Bld: 6.4 % — ABNORMAL HIGH (ref 4.8–5.6)
Mean Plasma Glucose: 136.98 mg/dL

## 2019-04-07 SURGERY — AMPUTATION, FOOT, PARTIAL
Anesthesia: General | Site: Foot | Laterality: Right

## 2019-04-07 MED ORDER — PROPOFOL 10 MG/ML IV BOLUS
INTRAVENOUS | Status: DC | PRN
  Administered 2019-04-07: 200 mg via INTRAVENOUS

## 2019-04-07 MED ORDER — 0.9 % SODIUM CHLORIDE (POUR BTL) OPTIME
TOPICAL | Status: DC | PRN
  Administered 2019-04-07: 1000 mL

## 2019-04-07 MED ORDER — FENTANYL CITRATE (PF) 100 MCG/2ML IJ SOLN: 25.0000 ug | INTRAMUSCULAR | Status: DC | PRN

## 2019-04-07 MED ORDER — MIDAZOLAM HCL 2 MG/2ML IJ SOLN
INTRAMUSCULAR | Status: DC | PRN
  Administered 2019-04-07: 2 mg via INTRAVENOUS

## 2019-04-07 MED ORDER — ONDANSETRON HCL 4 MG/2ML IJ SOLN: 4.0000 mg | Freq: Once | INTRAMUSCULAR | Status: DC | PRN

## 2019-04-07 MED ORDER — POVIDONE-IODINE 10 % EX SWAB: 2.0000 "application " | Freq: Once | CUTANEOUS | Status: DC

## 2019-04-07 MED ORDER — LACTATED RINGERS IV SOLN: INTRAVENOUS | Status: DC

## 2019-04-07 MED ORDER — DEXTROSE 5 % IV SOLN
3.0000 g | INTRAVENOUS | Status: AC
  Administered 2019-04-07: 3 g via INTRAVENOUS
  Filled 2019-04-07: qty 3

## 2019-04-07 MED ORDER — MIDAZOLAM HCL 2 MG/2ML IJ SOLN
INTRAMUSCULAR | Status: AC
  Filled 2019-04-07: qty 2

## 2019-04-07 MED ORDER — FENTANYL CITRATE (PF) 250 MCG/5ML IJ SOLN
INTRAMUSCULAR | Status: AC
  Filled 2019-04-07: qty 5

## 2019-04-07 MED ORDER — CHLORHEXIDINE GLUCONATE 4 % EX LIQD: 60.0000 mL | Freq: Once | CUTANEOUS | Status: DC

## 2019-04-07 MED ORDER — LIDOCAINE 2% (20 MG/ML) 5 ML SYRINGE
INTRAMUSCULAR | Status: DC | PRN
  Administered 2019-04-07: 60 mg via INTRAVENOUS

## 2019-04-07 MED ORDER — PROPOFOL 10 MG/ML IV BOLUS
INTRAVENOUS | Status: AC
  Filled 2019-04-07: qty 40

## 2019-04-07 MED ORDER — FENTANYL CITRATE (PF) 250 MCG/5ML IJ SOLN
INTRAMUSCULAR | Status: DC | PRN
  Administered 2019-04-07: 50 ug via INTRAVENOUS
  Administered 2019-04-07: 100 ug via INTRAVENOUS
  Administered 2019-04-07 (×2): 50 ug via INTRAVENOUS

## 2019-04-07 MED ORDER — ONDANSETRON HCL 4 MG/2ML IJ SOLN
INTRAMUSCULAR | Status: DC | PRN
  Administered 2019-04-07: 4 mg via INTRAVENOUS

## 2019-04-07 SURGICAL SUPPLY — 34 items
BENZOIN TINCTURE PRP APPL 2/3 (GAUZE/BANDAGES/DRESSINGS) ×3 IMPLANT
BLADE SAW SGTL HD 18.5X60.5X1. (BLADE) ×3 IMPLANT
BLADE SURG 21 STRL SS (BLADE) ×3 IMPLANT
BNDG COHESIVE 4X5 TAN STRL (GAUZE/BANDAGES/DRESSINGS) ×2 IMPLANT
COVER SURGICAL LIGHT HANDLE (MISCELLANEOUS) ×3 IMPLANT
COVER WAND RF STERILE (DRAPES) ×3 IMPLANT
DRAPE INCISE IOBAN 66X45 STRL (DRAPES) ×3 IMPLANT
DRAPE U-SHAPE 47X51 STRL (DRAPES) ×3 IMPLANT
DRESSING PEEL AND PLC PRVNA 13 (GAUZE/BANDAGES/DRESSINGS) IMPLANT
DRSG PEEL AND PLACE PREVENA 13 (GAUZE/BANDAGES/DRESSINGS) ×3
DURAPREP 26ML APPLICATOR (WOUND CARE) ×3 IMPLANT
ELECT REM PT RETURN 9FT ADLT (ELECTROSURGICAL) ×3
ELECTRODE REM PT RTRN 9FT ADLT (ELECTROSURGICAL) ×1 IMPLANT
GLOVE BIOGEL PI IND STRL 7.5 (GLOVE) IMPLANT
GLOVE BIOGEL PI IND STRL 9 (GLOVE) ×1 IMPLANT
GLOVE BIOGEL PI INDICATOR 7.5 (GLOVE) ×2
GLOVE BIOGEL PI INDICATOR 9 (GLOVE) ×2
GLOVE ECLIPSE 7.0 STRL STRAW (GLOVE) ×2 IMPLANT
GLOVE SURG ORTHO 9.0 STRL STRW (GLOVE) ×3 IMPLANT
GOWN STRL REUS W/ TWL XL LVL3 (GOWN DISPOSABLE) ×3 IMPLANT
GOWN STRL REUS W/TWL XL LVL3 (GOWN DISPOSABLE) ×8
KIT BASIN OR (CUSTOM PROCEDURE TRAY) ×3 IMPLANT
KIT DRSG PREVENA PLUS 7DAY 125 (MISCELLANEOUS) ×2 IMPLANT
KIT TURNOVER KIT B (KITS) ×3 IMPLANT
NS IRRIG 1000ML POUR BTL (IV SOLUTION) ×3 IMPLANT
PACK ORTHO EXTREMITY (CUSTOM PROCEDURE TRAY) ×3 IMPLANT
PAD ARMBOARD 7.5X6 YLW CONV (MISCELLANEOUS) ×6 IMPLANT
SUT ETHILON 2 0 PSLX (SUTURE) ×6 IMPLANT
TOWEL GREEN STERILE (TOWEL DISPOSABLE) ×3 IMPLANT
TOWEL GREEN STERILE FF (TOWEL DISPOSABLE) ×3 IMPLANT
TUBE CONNECTING 12'X1/4 (SUCTIONS) ×1
TUBE CONNECTING 12X1/4 (SUCTIONS) ×2 IMPLANT
WATER STERILE IRR 1000ML POUR (IV SOLUTION) ×3 IMPLANT
YANKAUER SUCT BULB TIP NO VENT (SUCTIONS) ×3 IMPLANT

## 2019-04-07 NOTE — Transfer of Care (Signed)
Immediate Anesthesia Transfer of Care Note  Patient: Ronnie Shaw  Procedure(s) Performed: RIGHT TRANSMETATARSAL AMPUTATION (Right Foot)  Patient Location: PACU  Anesthesia Type:General  Level of Consciousness: awake, alert  and patient cooperative  Airway & Oxygen Therapy: Patient Spontanous Breathing  Post-op Assessment: Report given to RN and Post -op Vital signs reviewed and stable  Post vital signs: Reviewed and stable  Last Vitals:  Vitals Value Taken Time  BP    Temp    Pulse 87 04/07/19 1213  Resp 20 04/07/19 1213  SpO2 95 % 04/07/19 1213  Vitals shown include unvalidated device data.  Last Pain:  Vitals:   04/07/19 0854  PainSc: 6       Patients Stated Pain Goal: 5 (01/06/29 0762)  Complications: No apparent anesthesia complications

## 2019-04-07 NOTE — Progress Notes (Signed)
Orthopedic Tech Progress Note Patient Details:  Ronnie Shaw Jan 04, 1981 [id number]  Ortho Devices Type of Ortho Device: Postop shoe/boot Ortho Device/Splint Interventions: Application   Post Interventions Patient Tolerated: Well Instructions Provided: Care of device   [place] Pink 04/07/2019, 1:31 PM

## 2019-04-07 NOTE — Anesthesia Procedure Notes (Signed)
Procedure Name: LMA Insertion Date/Time: 04/07/2019 11:37 AM Performed by: Janace Litten, CRNA Pre-anesthesia Checklist: Patient identified, Emergency Drugs available, Suction available and Patient being monitored Patient Re-evaluated:Patient Re-evaluated prior to induction Oxygen Delivery Method: Circle System Utilized Preoxygenation: Pre-oxygenation with 100% oxygen Induction Type: IV induction Ventilation: Mask ventilation without difficulty LMA: LMA inserted LMA Size: 5.0 Number of attempts: 1 Airway Equipment and Method: Bite block Placement Confirmation: positive ETCO2 Tube secured with: Tape Dental Injury: Teeth and Oropharynx as per pre-operative assessment

## 2019-04-07 NOTE — Progress Notes (Signed)
Patient's money walked down to security and locked in safe: $540 total ($10x 10, $20x 17, $50 x2).

## 2019-04-07 NOTE — Anesthesia Postprocedure Evaluation (Signed)
Anesthesia Post Note  Patient: Ronnie Shaw  Procedure(s) Performed: RIGHT TRANSMETATARSAL AMPUTATION (Right Foot)     Patient location during evaluation: PACU Anesthesia Type: General Level of consciousness: awake and alert Pain management: pain level controlled Vital Signs Assessment: post-procedure vital signs reviewed and stable Respiratory status: spontaneous breathing, nonlabored ventilation, respiratory function stable and patient connected to nasal cannula oxygen Cardiovascular status: blood pressure returned to baseline and stable Postop Assessment: no apparent nausea or vomiting Anesthetic complications: no    Last Vitals:  Vitals:   04/07/19 1228 04/07/19 1245  BP: (!) 163/83 (!) 160/90  Pulse: 80 80  Resp: 17 17  Temp:  36.9 C  SpO2: 96% 96%    Last Pain:  Vitals:   04/07/19 1245  PainSc: 0-No pain                 Tiajuana Amass
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
## 2019-04-07 NOTE — Discharge Instructions (Signed)

## 2019-04-07 NOTE — Anesthesia Preprocedure Evaluation (Signed)
Anesthesia Evaluation  Patient identified by MRN, date of birth, ID band Patient awake    Reviewed: Allergy & Precautions, NPO status , Patient's Chart, lab work & pertinent test results  Airway Mallampati: II  TM Distance: >3 FB     Dental  (+) Dental Advisory Given   Pulmonary asthma , sleep apnea , Patient abstained from smoking., former smoker,    breath sounds clear to auscultation       Cardiovascular negative cardio ROS   Rhythm:Regular Rate:Normal     Neuro/Psych  Neuromuscular disease    GI/Hepatic negative GI ROS, Neg liver ROS,   Endo/Other  diabetes  Renal/GU negative Renal ROS     Musculoskeletal   Abdominal   Peds  Hematology  (+) anemia ,   Anesthesia Other Findings   Reproductive/Obstetrics                             Lab Results  Component Value Date   WBC 9.0 04/07/2019   HGB 9.8 (L) 04/07/2019   HCT 31.1 (L) 04/07/2019   MCV 89.6 04/07/2019   PLT 363 04/07/2019   Lab Results  Component Value Date   CREATININE 1.27 (H) 04/07/2019   BUN 21 (H) 04/07/2019   NA 135 04/07/2019   K 4.8 04/07/2019   CL 103 04/07/2019   CO2 22 04/07/2019    Anesthesia Physical Anesthesia Plan  ASA: III  Anesthesia Plan: General   Post-op Pain Management:    Induction: Intravenous  PONV Risk Score and Plan: 2 and Dexamethasone, Ondansetron and Treatment may vary due to age or medical condition  Airway Management Planned: LMA  Additional Equipment:   Intra-op Plan:   Post-operative Plan: Extubation in OR  Informed Consent: I have reviewed the patients History and Physical, chart, labs and discussed the procedure including the risks, benefits and alternatives for the proposed anesthesia with the patient or authorized representative who has indicated his/her understanding and acceptance.     Dental advisory given  Plan Discussed with: CRNA  Anesthesia Plan  Comments:         Anesthesia Quick Evaluation

## 2019-04-07 NOTE — Op Note (Signed)
04/07/2019  12:24 PM  PATIENT:  Ronnie Shaw    PRE-OPERATIVE DIAGNOSIS:  Osteomyelitis Right Foot  POST-OPERATIVE DIAGNOSIS:  Same  PROCEDURE:  RIGHT TRANSMETATARSAL AMPUTATION Application of Prevena wound VAC  SURGEON:  Newt Minion, MD  PHYSICIAN ASSISTANT:None ANESTHESIA:   General  PREOPERATIVE INDICATIONS:  Ronnie Shaw is a  38 y.o. male with a diagnosis of Osteomyelitis Right Foot who failed conservative measures and elected for surgical management.    The risks benefits and alternatives were discussed with the patient preoperatively including but not limited to the risks of infection, bleeding, nerve injury, cardiopulmonary complications, the need for revision surgery, among others, and the patient was willing to proceed.  OPERATIVE IMPLANTS: Praveena wound VAC 13 cm  @ENCIMAGES @  OPERATIVE FINDINGS: Surgical margins clear no abscess no necrotic tissue  OPERATIVE PROCEDURE: Patient was brought the operating room and underwent a general anesthetic.  After adequate levels anesthesia were obtained patient's right lower extremity was prepped using DuraPrep draped into a sterile field a timeout was called.  A fishmouth incision was made just proximal to the ulcerative tissue this was carried sharply down to bone the tissue margins were clear of infection.  A oscillating saw was used to perform a transmetatarsal amputation the bone was beveled plantarly and dorsally.  The wound was irrigated with normal saline electrocautery was used for hemostasis.  The incision was closed using 2-0 nylon a Praveena wound VAC was applied this was covered with Covan up to the tibial tubercle patient was extubated taken the PACU in stable condition   DISCHARGE PLANNING:  Antibiotic duration: Preoperative antibiotics  Weightbearing: Nonweightbearing on the right  Pain medication: Prescription for Percocet  Dressing care/ Wound VAC: Continue wound VAC for 1 week  Ambulatory devices:  Crutches  Discharge to: Home  Follow-up: In the office 1 week post operative.

## 2019-04-08 ENCOUNTER — Ambulatory Visit (INDEPENDENT_AMBULATORY_CARE_PROVIDER_SITE_OTHER): Payer: Self-pay | Admitting: Physician Assistant

## 2019-04-08 VITALS — Ht 73.0 in | Wt 270.0 lb

## 2019-04-08 DIAGNOSIS — Z89432 Acquired absence of left foot: Secondary | ICD-10-CM

## 2019-04-08 DIAGNOSIS — Z89431 Acquired absence of right foot: Secondary | ICD-10-CM
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
## 2019-04-16 ENCOUNTER — Encounter: Payer: Self-pay | Admitting: Orthopedic Surgery

## 2019-04-16 ENCOUNTER — Other Ambulatory Visit: Payer: Self-pay

## 2019-04-16 ENCOUNTER — Ambulatory Visit (INDEPENDENT_AMBULATORY_CARE_PROVIDER_SITE_OTHER): Payer: Self-pay | Admitting: Orthopedic Surgery

## 2019-04-16 VITALS — Ht 73.0 in | Wt 270.0 lb

## 2019-04-16 DIAGNOSIS — Z89431 Acquired absence of right foot: Secondary | ICD-10-CM

## 2019-04-16 MED ORDER — DOXYCYCLINE HYCLATE 100 MG PO TABS: 100.0000 mg | ORAL_TABLET | Freq: Two times a day (BID) | ORAL | 0 refills | Status: DC

## 2019-04-16 MED ORDER — OXYCODONE-ACETAMINOPHEN 10-325 MG PO TABS: 1.0000 | ORAL_TABLET | Freq: Four times a day (QID) | ORAL | 0 refills | Status: DC | PRN

## 2019-04-23 ENCOUNTER — Ambulatory Visit (INDEPENDENT_AMBULATORY_CARE_PROVIDER_SITE_OTHER): Payer: Self-pay | Admitting: Orthopedic Surgery

## 2019-04-23 ENCOUNTER — Other Ambulatory Visit: Payer: Self-pay

## 2019-04-23 ENCOUNTER — Encounter: Payer: Self-pay | Admitting: Orthopedic Surgery

## 2019-04-23 DIAGNOSIS — M86271 Subacute osteomyelitis, right ankle and foot: Secondary | ICD-10-CM
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
## 2019-04-27 ENCOUNTER — Encounter: Payer: Self-pay | Admitting: Orthopedic Surgery
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
## 2019-04-30 ENCOUNTER — Ambulatory Visit (INDEPENDENT_AMBULATORY_CARE_PROVIDER_SITE_OTHER): Payer: Self-pay | Admitting: Orthopedic Surgery

## 2019-04-30 ENCOUNTER — Encounter: Payer: Self-pay | Admitting: Orthopedic Surgery

## 2019-04-30 ENCOUNTER — Other Ambulatory Visit: Payer: Self-pay

## 2019-04-30 VITALS — Ht 73.0 in | Wt 270.0 lb

## 2019-04-30 DIAGNOSIS — M86271 Subacute osteomyelitis, right ankle and foot: Secondary | ICD-10-CM

## 2019-04-30 MED ORDER — NITROGLYCERIN 0.2 MG/HR TD PT24: 0.2000 mg | MEDICATED_PATCH | Freq: Every day | TRANSDERMAL | 1 refills | Status: DC

## 2019-04-30 MED ORDER — PENTOXIFYLLINE ER 400 MG PO TBCR: 400.0000 mg | EXTENDED_RELEASE_TABLET | Freq: Three times a day (TID) | ORAL | 2 refills | Status: DC
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
## 2019-05-07 ENCOUNTER — Other Ambulatory Visit: Payer: Self-pay

## 2019-05-07 ENCOUNTER — Ambulatory Visit (INDEPENDENT_AMBULATORY_CARE_PROVIDER_SITE_OTHER): Payer: Self-pay | Admitting: Orthopedic Surgery

## 2019-05-07 ENCOUNTER — Encounter: Payer: Self-pay | Admitting: Orthopedic Surgery

## 2019-05-07 VITALS — Ht 73.0 in | Wt 270.0 lb

## 2019-05-07 DIAGNOSIS — Z89431 Acquired absence of right foot: Secondary | ICD-10-CM

## 2019-05-07 DIAGNOSIS — I872 Venous insufficiency (chronic) (peripheral): Secondary | ICD-10-CM
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
## 2019-05-18 ENCOUNTER — Ambulatory Visit: Payer: Self-pay | Admitting: Orthopedic Surgery

## 2019-05-21 ENCOUNTER — Ambulatory Visit (INDEPENDENT_AMBULATORY_CARE_PROVIDER_SITE_OTHER): Payer: Self-pay | Admitting: Orthopedic Surgery

## 2019-05-21 ENCOUNTER — Encounter: Payer: Self-pay | Admitting: Orthopedic Surgery

## 2019-05-21 ENCOUNTER — Other Ambulatory Visit: Payer: Self-pay

## 2019-05-21 VITALS — Ht 73.0 in | Wt 270.0 lb

## 2019-05-21 DIAGNOSIS — I872 Venous insufficiency (chronic) (peripheral): Secondary | ICD-10-CM

## 2019-05-21 DIAGNOSIS — Z89431 Acquired absence of right foot: Secondary | ICD-10-CM

## 2019-05-21 MED ORDER — OXYCODONE-ACETAMINOPHEN 10-325 MG PO TABS: 1.0000 | ORAL_TABLET | Freq: Four times a day (QID) | ORAL | 0 refills | Status: DC | PRN
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
## 2019-05-28 ENCOUNTER — Ambulatory Visit: Payer: Self-pay | Admitting: Orthopedic Surgery

## 2019-05-29 ENCOUNTER — Other Ambulatory Visit: Payer: Self-pay

## 2019-05-29 ENCOUNTER — Encounter: Payer: Self-pay | Admitting: Physician Assistant

## 2019-05-29 ENCOUNTER — Ambulatory Visit (INDEPENDENT_AMBULATORY_CARE_PROVIDER_SITE_OTHER): Payer: Self-pay | Admitting: Physician Assistant

## 2019-05-29 VITALS — Ht 73.0 in | Wt 270.0 lb

## 2019-05-29 DIAGNOSIS — M86271 Subacute osteomyelitis, right ankle and foot: Secondary | ICD-10-CM
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
## 2019-06-04 ENCOUNTER — Ambulatory Visit: Payer: Self-pay | Admitting: Physician Assistant

## 2019-06-18 ENCOUNTER — Ambulatory Visit (INDEPENDENT_AMBULATORY_CARE_PROVIDER_SITE_OTHER): Payer: Self-pay | Admitting: Orthopedic Surgery

## 2019-06-18 ENCOUNTER — Other Ambulatory Visit: Payer: Self-pay

## 2019-06-18 ENCOUNTER — Encounter: Payer: Self-pay | Admitting: Orthopedic Surgery

## 2019-06-18 VITALS — Ht 73.0 in | Wt 270.0 lb

## 2019-06-18 DIAGNOSIS — Z89431 Acquired absence of right foot: Secondary | ICD-10-CM

## 2019-06-18 MED ORDER — NITROGLYCERIN 0.2 MG/HR TD PT24: 0.2000 mg | MEDICATED_PATCH | Freq: Every day | TRANSDERMAL | 1 refills | Status: DC

## 2019-06-18 MED ORDER — OXYCODONE-ACETAMINOPHEN 10-325 MG PO TABS: 1.0000 | ORAL_TABLET | Freq: Four times a day (QID) | ORAL | 0 refills | Status: DC | PRN
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
## 2019-07-14 ENCOUNTER — Ambulatory Visit (INDEPENDENT_AMBULATORY_CARE_PROVIDER_SITE_OTHER): Payer: Self-pay | Admitting: Orthopedic Surgery

## 2019-07-14 ENCOUNTER — Other Ambulatory Visit: Payer: Self-pay

## 2019-07-14 ENCOUNTER — Encounter: Payer: Self-pay | Admitting: Orthopedic Surgery

## 2019-07-14 VITALS — Ht 73.0 in | Wt 270.0 lb

## 2019-07-14 DIAGNOSIS — Z89431 Acquired absence of right foot: Secondary | ICD-10-CM

## 2019-07-15 ENCOUNTER — Encounter: Payer: Self-pay | Admitting: Orthopedic Surgery
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
## 2019-07-16 IMAGING — MR MR FOOT*R* W/O CM
4 of 5 series · 19 of 40 positions shown · non-contrast
Comparison: Radiographs dated 09/16/2017

CLINICAL DATA: Soft tissue ulceration of the right forefoot at the
fifth MTP joint. Swelling.

EXAM:
MRI OF THE RIGHT FOREFOOT WITHOUT CONTRAST
TECHNIQUE: Multiplanar, multisequence MR imaging of the right forefoot was
performed. No intravenous contrast was administered.

[Series 4: T1 · oblique · 4.0mm · 0.31mm/px · 8 of 40 slices shown (1 of 2)]
[im 1/40]
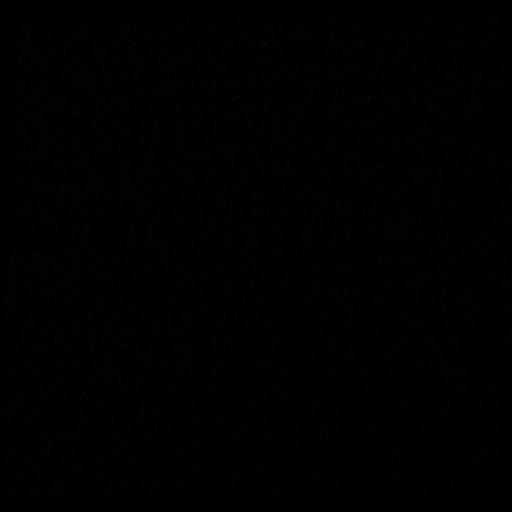
[im 5/40]
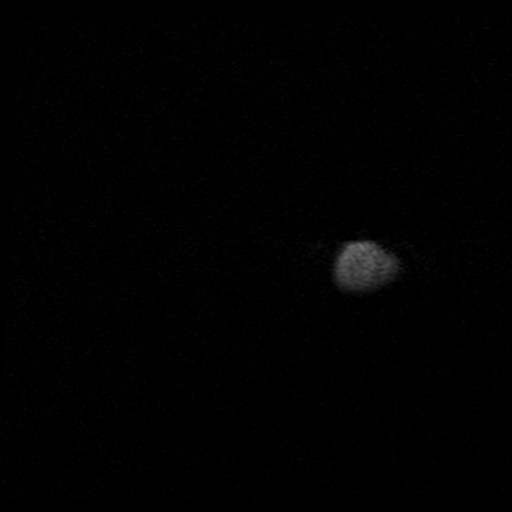
[im 14/40]
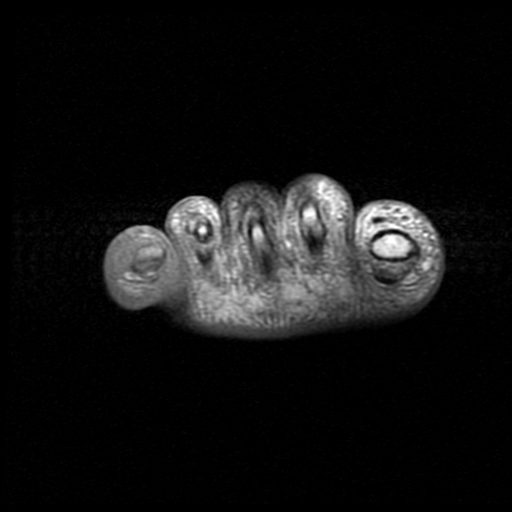
[im 18/40]
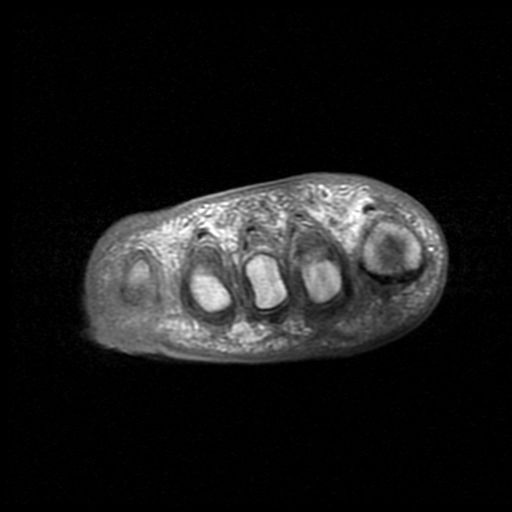
[im 22/40]
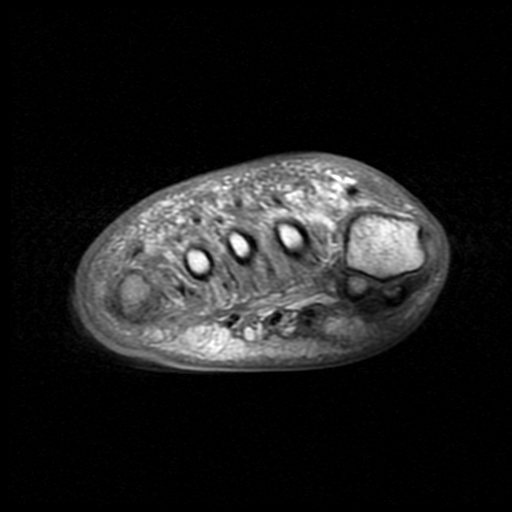
[im 27/40]
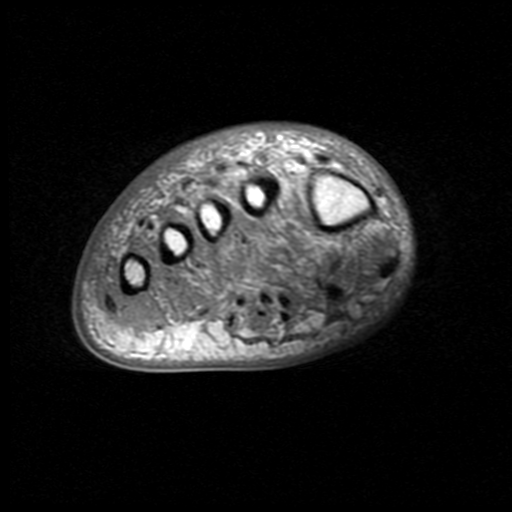
[im 35/40]
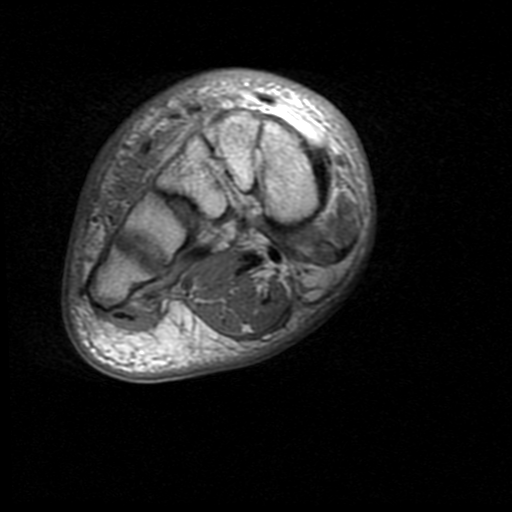
[im 40/40]
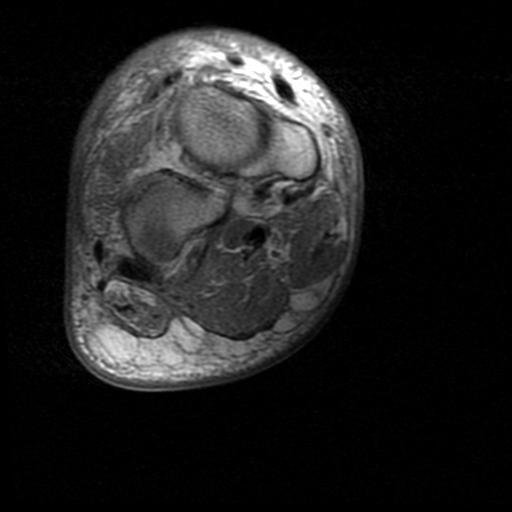

[Series 5: T2 · oblique · 4.0mm · 0.31mm/px · 3 of 40 slices shown]
[im 4/40]
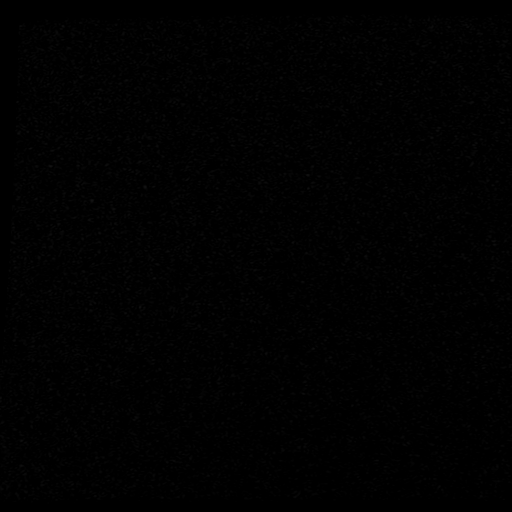
[im 20/40]
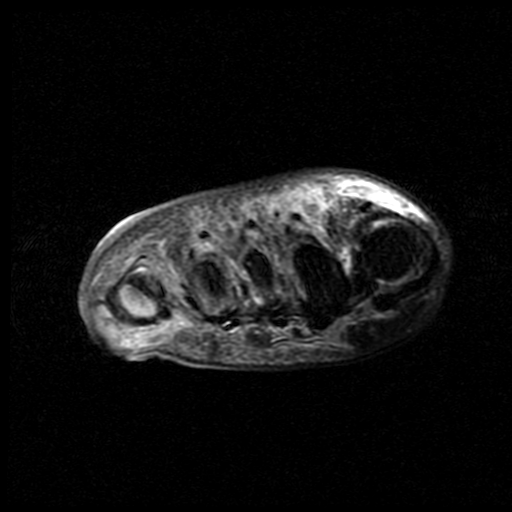
[im 36/40]
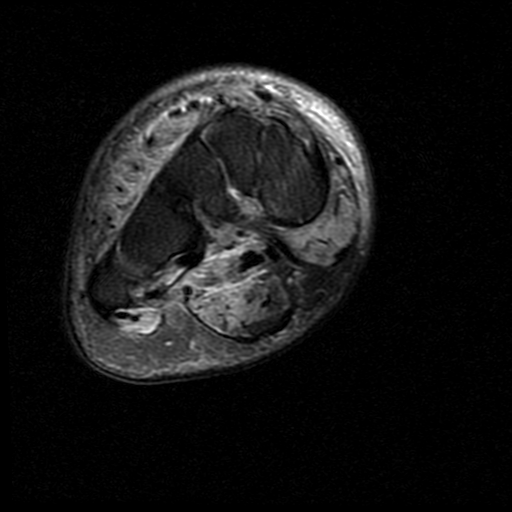

[Series 6: T1 · oblique · 4.0mm · 0.33mm/px · 5 of 22 slices shown (2 of 2)]
[im 1/22]
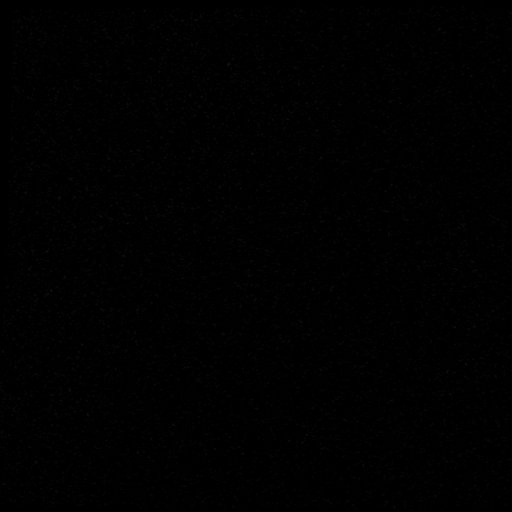
[im 5/22]
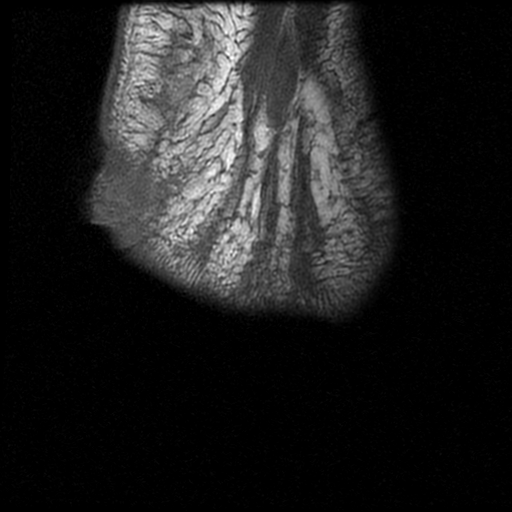
[im 9/22]
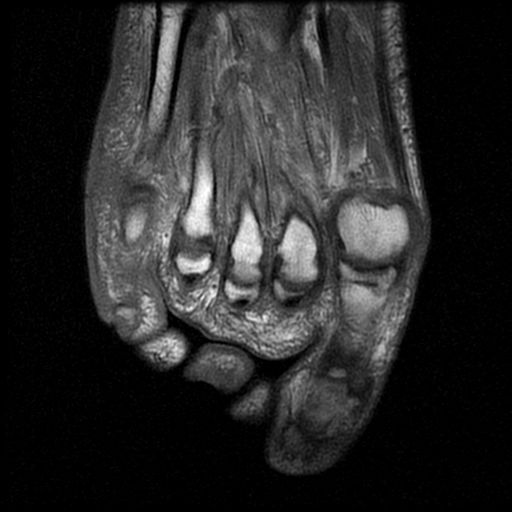
[im 13/22]
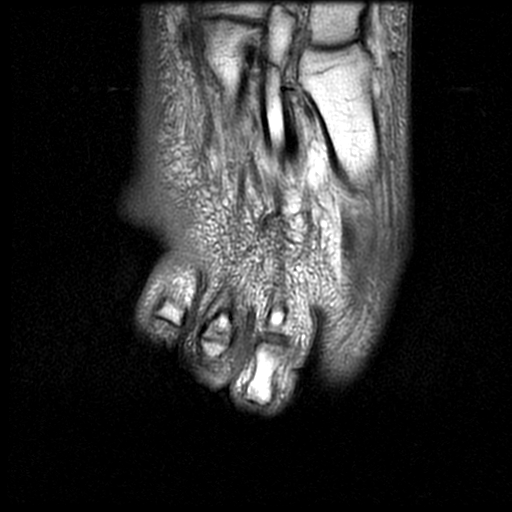
[im 22/22]
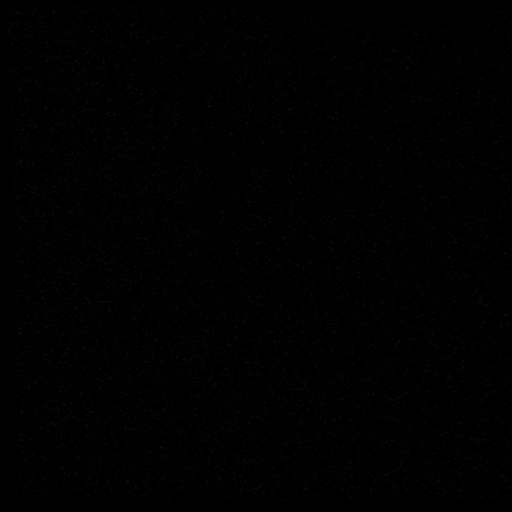

[Series 7: STIR · oblique · 4.0mm · 0.33mm/px · 3 of 22 slices shown]
[im 5/22]
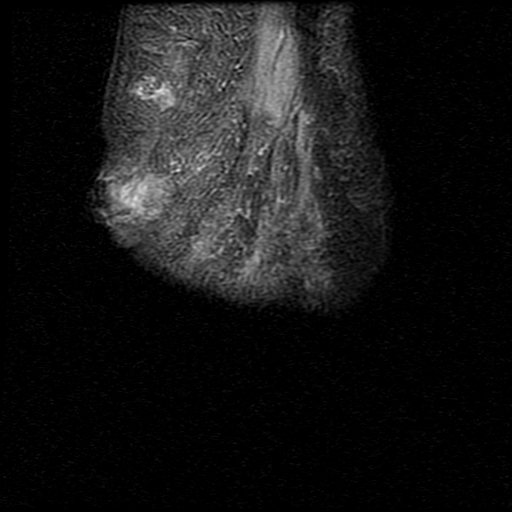
[im 13/22]
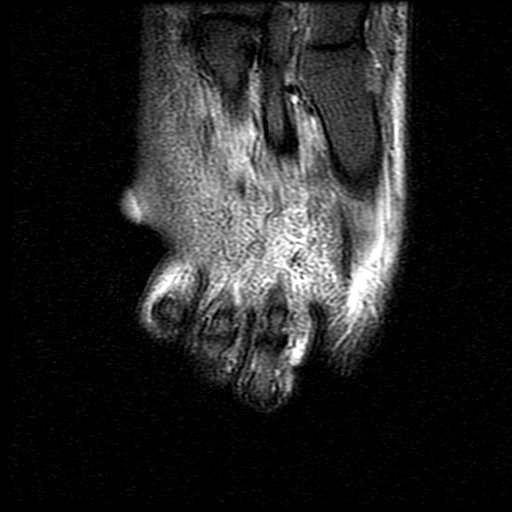
[im 22/22]
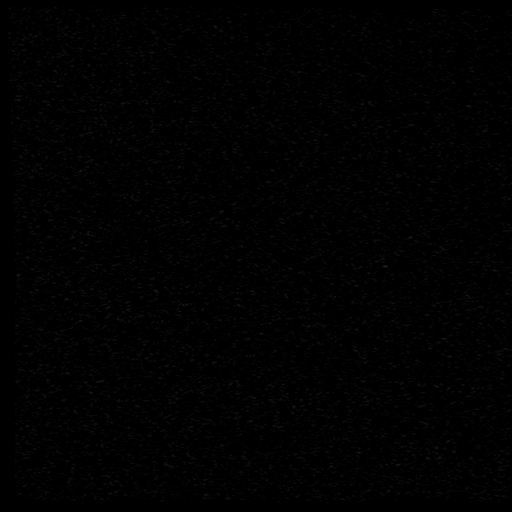

[19 of 40 positions shown; findings below may reference images not displayed]

FINDINGS: Bones/Joint/Cartilage

There is abnormal edema in the head of the fifth metatarsal and the
base of the proximal phalanx of the little toe with adjacent soft
tissue edema and soft tissue ulceration at the dorsal lateral aspect
of the joint. Physiologic amount of joint fluid. No discrete
cortical destruction.

The other bones of the forefoot are normal.

Muscles and Tendons

Normal.

Soft tissues

Soft tissue edema around the fifth MTP joint consistent with
cellulitis. Focal 15 mm soft tissue ulceration at the plantar
lateral aspect of fifth MTP joint.
IMPRESSION: Osteomyelitis of the head of the fifth metatarsal and the base of
the proximal phalanx of the little toe with adjacent cellulitis.

## 2019-08-04 ENCOUNTER — Encounter: Payer: Self-pay | Admitting: Orthopedic Surgery

## 2019-08-04 ENCOUNTER — Ambulatory Visit (INDEPENDENT_AMBULATORY_CARE_PROVIDER_SITE_OTHER): Payer: Self-pay | Admitting: Physician Assistant

## 2019-08-04 ENCOUNTER — Other Ambulatory Visit: Payer: Self-pay

## 2019-08-04 VITALS — Ht 73.0 in | Wt 270.0 lb

## 2019-08-04 DIAGNOSIS — M86271 Subacute osteomyelitis, right ankle and foot: Secondary | ICD-10-CM
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
## 2019-08-25 ENCOUNTER — Encounter: Payer: Self-pay | Admitting: Orthopedic Surgery

## 2019-08-25 ENCOUNTER — Ambulatory Visit (INDEPENDENT_AMBULATORY_CARE_PROVIDER_SITE_OTHER): Payer: Self-pay | Admitting: Physician Assistant

## 2019-08-25 ENCOUNTER — Other Ambulatory Visit: Payer: Self-pay

## 2019-08-25 VITALS — Ht 73.0 in | Wt 270.0 lb

## 2019-08-25 DIAGNOSIS — Z89432 Acquired absence of left foot: Secondary | ICD-10-CM
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
## 2019-09-08 ENCOUNTER — Ambulatory Visit: Payer: Self-pay | Admitting: Orthopedic Surgery

## 2019-09-17 ENCOUNTER — Telehealth: Payer: Self-pay | Admitting: Orthopedic Surgery

## 2019-09-17 NOTE — Telephone Encounter (Signed)
Patient called.   Has an appointment coming up but said he can't wait until then. Says the area he was operated on months ago is opening up  Call back: [phone]

## 2019-09-17 NOTE — Telephone Encounter (Signed)
Called pt and offered appt for today he could not make it offered appt for tomorrow at 9 am he will come in and see Erin.

## 2019-09-18 ENCOUNTER — Ambulatory Visit (INDEPENDENT_AMBULATORY_CARE_PROVIDER_SITE_OTHER): Payer: Self-pay | Admitting: Family

## 2019-09-18 ENCOUNTER — Encounter: Payer: Self-pay | Admitting: Family

## 2019-09-18 ENCOUNTER — Other Ambulatory Visit: Payer: Self-pay

## 2019-09-18 VITALS — Ht 73.0 in | Wt 270.0 lb

## 2019-09-18 DIAGNOSIS — Z89431 Acquired absence of right foot: Secondary | ICD-10-CM

## 2019-09-18 DIAGNOSIS — L97411 Non-pressure chronic ulcer of right heel and midfoot limited to breakdown of skin: Secondary | ICD-10-CM

## 2019-09-18 MED ORDER — DOXYCYCLINE HYCLATE 100 MG PO TABS: 100.0000 mg | ORAL_TABLET | Freq: Two times a day (BID) | ORAL | 0 refills | Status: DC

## 2019-09-18 MED ORDER — SILVER SULFADIAZINE 1 % EX CREA: 1.0000 "application " | TOPICAL_CREAM | Freq: Every day | CUTANEOUS | 0 refills | Status: DC
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
## 2019-09-21 ENCOUNTER — Ambulatory Visit: Payer: Self-pay | Admitting: Orthopedic Surgery

## 2019-09-22 ENCOUNTER — Encounter: Payer: Self-pay | Admitting: Family

## 2019-10-01 ENCOUNTER — Other Ambulatory Visit: Payer: Self-pay

## 2019-10-01 ENCOUNTER — Ambulatory Visit (INDEPENDENT_AMBULATORY_CARE_PROVIDER_SITE_OTHER): Payer: Self-pay | Admitting: Orthopedic Surgery

## 2019-10-01 DIAGNOSIS — Z89431 Acquired absence of right foot: Secondary | ICD-10-CM

## 2019-10-01 DIAGNOSIS — M86271 Subacute osteomyelitis, right ankle and foot: Secondary | ICD-10-CM

## 2019-10-02 ENCOUNTER — Encounter: Payer: Self-pay | Admitting: Orthopedic Surgery
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
## 2019-10-08 ENCOUNTER — Encounter: Payer: Self-pay | Admitting: Orthopedic Surgery

## 2019-10-08 ENCOUNTER — Other Ambulatory Visit: Payer: Self-pay

## 2019-10-08 ENCOUNTER — Ambulatory Visit (INDEPENDENT_AMBULATORY_CARE_PROVIDER_SITE_OTHER): Payer: Self-pay | Admitting: Physician Assistant

## 2019-10-08 DIAGNOSIS — Z89432 Acquired absence of left foot: Secondary | ICD-10-CM
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
## 2019-10-20 ENCOUNTER — Ambulatory Visit: Payer: Self-pay | Admitting: Orthopedic Surgery

## 2019-10-27 ENCOUNTER — Other Ambulatory Visit: Payer: Self-pay

## 2019-10-27 ENCOUNTER — Encounter: Payer: Self-pay | Admitting: Orthopedic Surgery

## 2019-10-27 ENCOUNTER — Ambulatory Visit (INDEPENDENT_AMBULATORY_CARE_PROVIDER_SITE_OTHER): Payer: Self-pay | Admitting: Orthopedic Surgery

## 2019-10-27 VITALS — Ht 73.0 in | Wt 270.0 lb

## 2019-10-27 DIAGNOSIS — Z89431 Acquired absence of right foot: Secondary | ICD-10-CM

## 2019-10-27 DIAGNOSIS — Z89432 Acquired absence of left foot: Secondary | ICD-10-CM

## 2019-10-28 ENCOUNTER — Other Ambulatory Visit: Payer: Self-pay | Admitting: Physician Assistant

## 2019-10-29 ENCOUNTER — Other Ambulatory Visit: Payer: Self-pay

## 2019-10-29 ENCOUNTER — Encounter (HOSPITAL_COMMUNITY): Payer: Self-pay | Admitting: Orthopedic Surgery

## 2019-10-29 MED ORDER — DEXTROSE 5 % IV SOLN
3.0000 g | INTRAVENOUS | Status: AC
  Administered 2019-10-30: 3 g via INTRAVENOUS
  Filled 2019-10-29: qty 3

## 2019-10-29 NOTE — Progress Notes (Signed)
Mr Ronnie Shaw denies cheat pain or shortness of breath. [name] denies having any s/s of Covid or been around anyone who does. [name] does not have a ride that can take him to be tested for Covid before 1500, he will arrive 3 hours prior to surgery to be tested.  Mr Ronnie Shaw has type II diabetes, he does not take medication or check CBG.

## 2019-10-30 ENCOUNTER — Ambulatory Visit (HOSPITAL_COMMUNITY)
Admission: RE | Admit: 2019-10-30 | Discharge: 2019-10-30 | Disposition: A | Discharge status: Home / Self Care | Payer: Self-pay | Attending: Orthopedic Surgery | Admitting: Orthopedic Surgery

## 2019-10-30 ENCOUNTER — Encounter (HOSPITAL_COMMUNITY)
Admission: RE | Disposition: A | Discharge status: Home / Self Care | Payer: Self-pay | Source: Home / Self Care | Attending: Orthopedic Surgery

## 2019-10-30 ENCOUNTER — Ambulatory Visit (HOSPITAL_COMMUNITY): Payer: Self-pay

## 2019-10-30 ENCOUNTER — Telehealth: Payer: Self-pay

## 2019-10-30 DIAGNOSIS — Z87891 Personal history of nicotine dependence: Secondary | ICD-10-CM | POA: Insufficient documentation

## 2019-10-30 DIAGNOSIS — E669 Obesity, unspecified: Secondary | ICD-10-CM | POA: Insufficient documentation

## 2019-10-30 DIAGNOSIS — G4730 Sleep apnea, unspecified: Secondary | ICD-10-CM | POA: Insufficient documentation

## 2019-10-30 DIAGNOSIS — D869 Sarcoidosis, unspecified: Secondary | ICD-10-CM | POA: Insufficient documentation

## 2019-10-30 DIAGNOSIS — Z79899 Other long term (current) drug therapy: Secondary | ICD-10-CM | POA: Insufficient documentation

## 2019-10-30 DIAGNOSIS — M869 Osteomyelitis, unspecified: Secondary | ICD-10-CM | POA: Insufficient documentation

## 2019-10-30 DIAGNOSIS — G40909 Epilepsy, unspecified, not intractable, without status epilepticus: Secondary | ICD-10-CM | POA: Insufficient documentation

## 2019-10-30 DIAGNOSIS — Z6836 Body mass index (BMI) 36.0-36.9, adult: Secondary | ICD-10-CM | POA: Insufficient documentation

## 2019-10-30 DIAGNOSIS — E1142 Type 2 diabetes mellitus with diabetic polyneuropathy: Secondary | ICD-10-CM | POA: Insufficient documentation

## 2019-10-30 DIAGNOSIS — F329 Major depressive disorder, single episode, unspecified: Secondary | ICD-10-CM | POA: Insufficient documentation

## 2019-10-30 DIAGNOSIS — J45909 Unspecified asthma, uncomplicated: Secondary | ICD-10-CM | POA: Insufficient documentation

## 2019-10-30 DIAGNOSIS — Z20822 Contact with and (suspected) exposure to covid-19: Secondary | ICD-10-CM | POA: Insufficient documentation

## 2019-10-30 DIAGNOSIS — L02611 Cutaneous abscess of right foot: Secondary | ICD-10-CM | POA: Insufficient documentation

## 2019-10-30 DIAGNOSIS — M86171 Other acute osteomyelitis, right ankle and foot: Secondary | ICD-10-CM

## 2019-10-30 HISTORY — DX: Unspecified convulsions: R56.9

## 2019-10-30 HISTORY — PX: AMPUTATION: SHX166

## 2019-10-30 LAB — BASIC METABOLIC PANEL
Anion gap: 8 (ref 5–15)
BUN: 36 mg/dL — ABNORMAL HIGH (ref 6–20)
CO2: 23 mmol/L (ref 22–32)
Calcium: 8.8 mg/dL — ABNORMAL LOW (ref 8.9–10.3)
Chloride: 105 mmol/L (ref 98–111)
Creatinine, Ser: 1.62 mg/dL — ABNORMAL HIGH (ref 0.61–1.24)
GFR calc Af Amer: >60 mL/min (ref >60)
GFR calc non Af Amer: 53 mL/min — ABNORMAL LOW (ref >60)
Glucose, Bld: 127 mg/dL — ABNORMAL HIGH (ref 70–99)
Potassium: 5.1 mmol/L (ref 3.5–5.1)
Sodium: 136 mmol/L (ref 135–145)

## 2019-10-30 LAB — CBC
HCT: 30.7 % — ABNORMAL LOW (ref 39.0–52.0)
Hemoglobin: 9.4 g/dL — ABNORMAL LOW (ref 13.0–17.0)
MCH: 27.4 pg (ref 26.0–34.0)
MCHC: 30.6 g/dL (ref 30.0–36.0)
MCV: 89.5 fL (ref 80.0–100.0)
Platelets: 254 10*3/uL (ref 150–400)
RBC: 3.43 MIL/uL — ABNORMAL LOW (ref 4.22–5.81)
RDW: 16.9 % — ABNORMAL HIGH (ref 11.5–15.5)
WBC: 5.9 10*3/uL (ref 4.0–10.5)
nRBC: 0 % (ref 0.0–0.2)

## 2019-10-30 LAB — GLUCOSE, CAPILLARY
Glucose-Capillary: 143 mg/dL — ABNORMAL HIGH (ref 70–99)
Glucose-Capillary: 123 mg/dL — ABNORMAL HIGH (ref 70–99)

## 2019-10-30 LAB — SARS CORONAVIRUS 2 BY RT PCR (HOSPITAL ORDER, PERFORMED IN ~~LOC~~ HOSPITAL LAB): SARS Coronavirus 2: NEGATIVE

## 2019-10-30 SURGERY — AMPUTATION, FOOT, PARTIAL
Anesthesia: General | Site: Foot | Laterality: Right

## 2019-10-30 MED ORDER — FENTANYL CITRATE (PF) 100 MCG/2ML IJ SOLN
INTRAMUSCULAR | Status: DC | PRN
  Administered 2019-10-30: 100 ug via INTRAVENOUS
  Administered 2019-10-30: 50 ug via INTRAVENOUS

## 2019-10-30 MED ORDER — FENTANYL CITRATE (PF) 250 MCG/5ML IJ SOLN
INTRAMUSCULAR | Status: AC
  Filled 2019-10-30: qty 5

## 2019-10-30 MED ORDER — OXYCODONE HCL 5 MG/5ML PO SOLN: 5.0000 mg | Freq: Once | ORAL | Status: DC | PRN

## 2019-10-30 MED ORDER — MIDAZOLAM HCL 2 MG/2ML IJ SOLN
INTRAMUSCULAR | Status: DC | PRN
  Administered 2019-10-30: 2 mg via INTRAVENOUS

## 2019-10-30 MED ORDER — LACTATED RINGERS IV SOLN: INTRAVENOUS | Status: DC

## 2019-10-30 MED ORDER — HYDROMORPHONE HCL 1 MG/ML IJ SOLN: 0.2500 mg | INTRAMUSCULAR | Status: DC | PRN

## 2019-10-30 MED ORDER — ONDANSETRON HCL 4 MG/2ML IJ SOLN
INTRAMUSCULAR | Status: DC | PRN
  Administered 2019-10-30: 4 mg via INTRAVENOUS

## 2019-10-30 MED ORDER — LIDOCAINE 2% (20 MG/ML) 5 ML SYRINGE
INTRAMUSCULAR | Status: DC | PRN
  Administered 2019-10-30: 100 mg via INTRAVENOUS

## 2019-10-30 MED ORDER — ACETAMINOPHEN 500 MG PO TABS
1000.0000 mg | ORAL_TABLET | Freq: Once | ORAL | Status: AC
  Administered 2019-10-30: 1000 mg via ORAL
  Filled 2019-10-30: qty 2

## 2019-10-30 MED ORDER — MIDAZOLAM HCL 2 MG/2ML IJ SOLN
INTRAMUSCULAR | Status: AC
  Filled 2019-10-30: qty 2

## 2019-10-30 MED ORDER — PHENYLEPHRINE 40 MCG/ML (10ML) SYRINGE FOR IV PUSH (FOR BLOOD PRESSURE SUPPORT)
PREFILLED_SYRINGE | INTRAVENOUS | Status: DC | PRN
  Administered 2019-10-30: 200 ug via INTRAVENOUS

## 2019-10-30 MED ORDER — PROPOFOL 10 MG/ML IV BOLUS
INTRAVENOUS | Status: DC | PRN
  Administered 2019-10-30: 200 mg via INTRAVENOUS

## 2019-10-30 MED ORDER — 0.9 % SODIUM CHLORIDE (POUR BTL) OPTIME
TOPICAL | Status: DC | PRN
  Administered 2019-10-30: 1000 mL

## 2019-10-30 MED ORDER — DEXAMETHASONE SODIUM PHOSPHATE 10 MG/ML IJ SOLN
INTRAMUSCULAR | Status: DC | PRN
Start: 2019-10-30 — End: 2019-10-30
  Administered 2019-10-30: 4 mg via INTRAVENOUS

## 2019-10-30 MED ORDER — PROMETHAZINE HCL 25 MG/ML IJ SOLN: 6.2500 mg | INTRAMUSCULAR | Status: DC | PRN

## 2019-10-30 MED ORDER — OXYCODONE-ACETAMINOPHEN 10-325 MG PO TABS: 1.0000 | ORAL_TABLET | Freq: Four times a day (QID) | ORAL | 0 refills | Status: DC | PRN

## 2019-10-30 MED ORDER — OXYCODONE HCL 5 MG PO TABS: 5.0000 mg | ORAL_TABLET | Freq: Once | ORAL | Status: DC | PRN

## 2019-10-30 MED ORDER — CHLORHEXIDINE GLUCONATE 0.12 % MT SOLN
15.0000 mL | Freq: Once | OROMUCOSAL | Status: AC
  Administered 2019-10-30: 15 mL via OROMUCOSAL
  Filled 2019-10-30: qty 15

## 2019-10-30 MED ORDER — ORAL CARE MOUTH RINSE: 15.0000 mL | Freq: Once | OROMUCOSAL | Status: AC

## 2019-10-30 SURGICAL SUPPLY — 25 items
BLADE SAW SGTL HD 18.5X60.5X1. (BLADE) ×2 IMPLANT
BLADE SURG 21 STRL SS (BLADE) ×2 IMPLANT
BNDG COHESIVE 4X5 TAN STRL (GAUZE/BANDAGES/DRESSINGS) ×2 IMPLANT
COVER SURGICAL LIGHT HANDLE (MISCELLANEOUS) ×2 IMPLANT
DRAPE INCISE IOBAN 66X45 STRL (DRAPES) ×2 IMPLANT
DRAPE U-SHAPE 47X51 STRL (DRAPES) ×2 IMPLANT
DURAPREP 26ML APPLICATOR (WOUND CARE) ×2 IMPLANT
ELECT REM PT RETURN 9FT ADLT (ELECTROSURGICAL) ×2
ELECTRODE REM PT RTRN 9FT ADLT (ELECTROSURGICAL) ×1 IMPLANT
GAUZE SPONGE 4X4 12PLY STRL (GAUZE/BANDAGES/DRESSINGS) IMPLANT
GLOVE BIOGEL PI IND STRL 9 (GLOVE) ×1 IMPLANT
GLOVE BIOGEL PI INDICATOR 9 (GLOVE) ×1
GLOVE SURG ORTHO 9.0 STRL STRW (GLOVE) ×2 IMPLANT
GOWN STRL REUS W/ TWL XL LVL3 (GOWN DISPOSABLE) ×3 IMPLANT
GOWN STRL REUS W/TWL XL LVL3 (GOWN DISPOSABLE) ×3
KIT BASIN OR (CUSTOM PROCEDURE TRAY) ×2 IMPLANT
KIT TURNOVER KIT B (KITS) ×2 IMPLANT
NS IRRIG 1000ML POUR BTL (IV SOLUTION) ×2 IMPLANT
PACK ORTHO EXTREMITY (CUSTOM PROCEDURE TRAY) ×2 IMPLANT
PREVENA RESTOR AXIOFORM 29X28 (GAUZE/BANDAGES/DRESSINGS) ×2 IMPLANT
SPONGE LAP 18X18 RF (DISPOSABLE) ×2 IMPLANT
SUT ETHILON 2 0 PSLX (SUTURE) ×4 IMPLANT
TOWEL GREEN STERILE FF (TOWEL DISPOSABLE) ×2 IMPLANT
TUBE CONNECTING 12X1/4 (SUCTIONS) ×2 IMPLANT
YANKAUER SUCT BULB TIP NO VENT (SUCTIONS) ×2 IMPLANT

## 2019-10-30 NOTE — Anesthesia Procedure Notes (Signed)
Procedure Name: LMA Insertion Date/Time: 10/30/2019 8:41 AM Performed by: Jodell Cipro, CRNA Pre-anesthesia Checklist: Patient identified, Emergency Drugs available, Suction available and Patient being monitored Patient Re-evaluated:Patient Re-evaluated prior to induction Oxygen Delivery Method: Circle System Utilized Preoxygenation: Pre-oxygenation with 100% oxygen Induction Type: IV induction Ventilation: Mask ventilation without difficulty LMA: LMA inserted LMA Size: 5.0 Number of attempts: 1 Airway Equipment and Method: Bite block Placement Confirmation: positive ETCO2 Tube secured with: Tape Dental Injury: Teeth and Oropharynx as per pre-operative assessment

## 2019-10-30 NOTE — Transfer of Care (Signed)
Immediate Anesthesia Transfer of Care Note  Patient: Ronnie Shaw  Procedure(s) Performed: RIGHT MIDFOOT AMPUTATION (Right Foot)  Patient Location: PACU  Anesthesia Type:General  Level of Consciousness: awake, alert  and oriented  Airway & Oxygen Therapy: Patient Spontanous Breathing and Patient connected to nasal cannula oxygen  Post-op Assessment: Report given to RN, Post -op Vital signs reviewed and stable and Patient moving all extremities  Post vital signs: Reviewed and stable  Last Vitals:  Vitals Value Taken Time  BP 161/91 10/30/19 0915  Temp    Pulse 74 10/30/19 0920  Resp 16 10/30/19 0920  SpO2 95 % 10/30/19 0920  Vitals shown include unvalidated device data.  Last Pain:  Vitals:   10/30/19 0707  TempSrc:   PainSc: 0-No pain         Complications: No complications documented.

## 2019-10-30 NOTE — Progress Notes (Signed)
Orthopedic Tech Progress Note Patient Details:  Ronnie Shaw Mar 24, 1981 [id number] PACU RN called requesting a [facility] SHOE for patient Ortho Devices Type of Ortho Device: Darco shoe Ortho Device/Splint Location: RLE Ortho Device/Splint Interventions: Ordered, Application   Post Interventions Patient Tolerated: Well Instructions Provided: Care of device   Ronnie Shaw 10/30/2019, 10:12 AM
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
## 2019-10-30 NOTE — Anesthesia Postprocedure Evaluation (Signed)
Anesthesia Post Note  Patient: Ronnie Shaw  Procedure(s) Performed: RIGHT MIDFOOT AMPUTATION (Right Foot)     Patient location during evaluation: PACU Anesthesia Type: General Level of consciousness: awake and alert, oriented and patient cooperative Pain management: pain level controlled Vital Signs Assessment: post-procedure vital signs reviewed and stable Respiratory status: spontaneous breathing, nonlabored ventilation and respiratory function stable Cardiovascular status: blood pressure returned to baseline and stable Postop Assessment: no apparent nausea or vomiting Anesthetic complications: no   No complications documented.  Last Vitals:  Vitals:   10/30/19 0930 10/30/19 0944  BP: (!) 162/97 (!) 152/91  Pulse: 73 76  Resp: 17 16  Temp:  36.6 C  SpO2: 93% 93%    Last Pain:  Vitals:   10/30/19 0944  TempSrc:   PainSc: 0-No pain                 Lannie Fields

## 2019-10-30 NOTE — Anesthesia Preprocedure Evaluation (Addendum)
Anesthesia Evaluation  Patient identified by MRN, date of birth, ID band Patient awake    Reviewed: Allergy & Precautions, NPO status , Patient's Chart, lab work & pertinent test results  Airway Mallampati: II  TM Distance: >3 FB Neck ROM: Full    Dental no notable dental hx. (+) Poor Dentition, Missing, Dental Advisory Given,    Pulmonary asthma , sleep apnea , Patient abstained from smoking., former smoker,  OSA- no CPAP, unable to tolerate Asthma- last inhaler use about 1-2 years ago Sarcoidosis- no meds   Pulmonary exam normal breath sounds clear to auscultation       Cardiovascular negative cardio ROS Normal cardiovascular exam Rhythm:Regular Rate:Normal     Neuro/Psych Seizures -, Well Controlled,  PSYCHIATRIC DISORDERS Depression Peripheral neuropathy  Neuromuscular disease    GI/Hepatic negative GI ROS, (+)     substance abuse  marijuana use,   Endo/Other  diabetes, Well Controlled, Type 2, Insulin DependentObesity BMI 36 Last a1c 6.4 FS this AM 140 Does not use insulin at home  Renal/GU negative Renal ROS  negative genitourinary   Musculoskeletal negative musculoskeletal ROS (+)   Abdominal (+) + obese,   Peds negative pediatric ROS (+)  Hematology  (+) Blood dyscrasia, anemia ,   Anesthesia Other Findings   Reproductive/Obstetrics negative OB ROS                           Anesthesia Physical Anesthesia Plan  ASA: III  Anesthesia Plan: General   Post-op Pain Management:    Induction: Intravenous  PONV Risk Score and Plan: 2 and Ondansetron, Dexamethasone and Treatment may vary due to age or medical condition  Airway Management Planned: LMA  Additional Equipment: None  Intra-op Plan:   Post-operative Plan: Extubation in OR  Informed Consent: I have reviewed the patients History and Physical, chart, labs and discussed the procedure including the risks, benefits  and alternatives for the proposed anesthesia with the patient or authorized representative who has indicated his/her understanding and acceptance.     Dental advisory given  Plan Discussed with: CRNA  Anesthesia Plan Comments: (Has not had nerve blocks in the past and does not recall having any pain postop)        Anesthesia Quick Evaluation

## 2019-10-30 NOTE — Telephone Encounter (Signed)
Patient called stated wound vac was beeping. I instructed patient per Dr Lajoyce Corners. We were able to get the beeping to stop. Patient will keep check on this and call us on Monday if continues to have issues over the weekend. Advised him if beeping continues Dr Lajoyce Corners advised to turn vac off and we will see him Monday.

## 2019-10-30 NOTE — Op Note (Signed)
10/30/2019  9:17 AM  PATIENT:  Ronnie Shaw    PRE-OPERATIVE DIAGNOSIS:  Abscess Right Foot Transmetatarsal Amputation with osteomyelitis of the second metatarsal  POST-OPERATIVE DIAGNOSIS:  Same  PROCEDURE:  RIGHT MIDFOOT AMPUTATION, Chopart amputation. Application of axial form Prevena wound VAC  SURGEON:  [name], MD  PHYSICIAN ASSISTANT:None ANESTHESIA:   General  PREOPERATIVE INDICATIONS:  Ronnie Shaw is a  39 y.o. male with a diagnosis of Abscess Right Foot Transmetatarsal Amputation who failed conservative measures and elected for surgical management.    The risks benefits and alternatives were discussed with the patient preoperatively including but not limited to the risks of infection, bleeding, nerve injury, cardiopulmonary complications, the need for revision surgery, among others, and the patient was willing to proceed.  OPERATIVE IMPLANTS: Axial form wound VAC  @ENCIMAGES @  OPERATIVE FINDINGS: Large ulceration extended down to the metatarsals no deep abscess  OPERATIVE PROCEDURE: Patient brought the operating room and underwent a general anesthetic.  After adequate levels anesthesia obtained patient's right lower extremity was prepped using DuraPrep draped into a sterile field a timeout was called.  A fishmouth incision was made proximal to the ulcer that extended down to bone.  This was carried down to bone a Chopart amputation was performed electrocautery was used hemostasis the wound was irrigated with normal saline the tissue margins were clear no signs of infection of the tissue margins.  Incision was closed using 2-0 nylon.  Due to patient's a massive swelling in the right lower extremity a axial form dressing was applied to encompass more area than the incision.  This had a good suction fit this was overlapped with Coban up to the tibial tubercle patient was extubated taken the PACU in stable condition.   DISCHARGE PLANNING:  Antibiotic duration:  Preoperative antibiotics.  Weightbearing: Touchdown weightbearing on the right  Pain medication: Prescription for Percocet 10 mg tablets  Dressing care/ Wound VAC: Continue wound VAC for 1 week  Ambulatory devices: Walker or crutches.  Discharge to: Home.  Follow-up: In the office 1 week post operative.

## 2019-10-31 ENCOUNTER — Encounter (HOSPITAL_COMMUNITY): Payer: Self-pay | Admitting: Orthopedic Surgery

## 2019-11-02 ENCOUNTER — Ambulatory Visit (INDEPENDENT_AMBULATORY_CARE_PROVIDER_SITE_OTHER): Payer: Self-pay | Admitting: Physician Assistant

## 2019-11-02 ENCOUNTER — Encounter: Payer: Self-pay | Admitting: Orthopedic Surgery

## 2019-11-02 ENCOUNTER — Other Ambulatory Visit: Payer: Self-pay

## 2019-11-02 VITALS — Ht 73.0 in | Wt 270.0 lb

## 2019-11-02 DIAGNOSIS — Z89421 Acquired absence of other right toe(s): Secondary | ICD-10-CM
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
## 2019-11-06 ENCOUNTER — Inpatient Hospital Stay: Payer: Self-pay | Admitting: Physician Assistant

## 2019-11-09 ENCOUNTER — Inpatient Hospital Stay: Payer: Self-pay | Admitting: Physician Assistant

## 2019-11-10 ENCOUNTER — Encounter: Payer: Self-pay | Admitting: Family

## 2019-11-10 ENCOUNTER — Ambulatory Visit (INDEPENDENT_AMBULATORY_CARE_PROVIDER_SITE_OTHER): Payer: Self-pay | Admitting: Family

## 2019-11-10 VITALS — Ht 73.0 in | Wt 270.0 lb

## 2019-11-10 DIAGNOSIS — Z89431 Acquired absence of right foot: Secondary | ICD-10-CM

## 2019-11-11 ENCOUNTER — Encounter: Payer: Self-pay | Admitting: Family

## 2019-11-11 MED ORDER — SULFAMETHOXAZOLE-TRIMETHOPRIM 800-160 MG PO TABS: 1.0000 | ORAL_TABLET | Freq: Two times a day (BID) | ORAL | 0 refills | Status: DC
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
## 2019-11-16 ENCOUNTER — Encounter: Payer: Self-pay | Admitting: Orthopedic Surgery
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
## 2019-11-24 ENCOUNTER — Encounter: Payer: Self-pay | Admitting: Physician Assistant

## 2019-11-24 ENCOUNTER — Ambulatory Visit (INDEPENDENT_AMBULATORY_CARE_PROVIDER_SITE_OTHER): Payer: Self-pay | Admitting: Physician Assistant

## 2019-11-24 VITALS — Ht 73.0 in | Wt 270.0 lb

## 2019-11-24 DIAGNOSIS — Z89432 Acquired absence of left foot: Secondary | ICD-10-CM

## 2019-11-24 MED ORDER — NITROGLYCERIN 0.2 MG/HR TD PT24: 0.2000 mg | MEDICATED_PATCH | Freq: Every day | TRANSDERMAL | 1 refills | Status: DC
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
## 2019-12-09 ENCOUNTER — Ambulatory Visit: Payer: Self-pay | Admitting: Physician Assistant

## 2019-12-16 ENCOUNTER — Encounter: Payer: Self-pay | Admitting: Physician Assistant

## 2019-12-16 ENCOUNTER — Other Ambulatory Visit: Payer: Self-pay

## 2019-12-16 ENCOUNTER — Ambulatory Visit (INDEPENDENT_AMBULATORY_CARE_PROVIDER_SITE_OTHER): Payer: Self-pay | Admitting: Physician Assistant

## 2019-12-16 VITALS — Ht 73.0 in | Wt 270.0 lb

## 2019-12-16 DIAGNOSIS — M86171 Other acute osteomyelitis, right ankle and foot: Secondary | ICD-10-CM

## 2019-12-16 MED ORDER — PENTOXIFYLLINE ER 400 MG PO TBCR: 400.0000 mg | EXTENDED_RELEASE_TABLET | Freq: Three times a day (TID) | ORAL | 2 refills | Status: DC
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
## 2019-12-31 ENCOUNTER — Other Ambulatory Visit: Payer: Self-pay

## 2019-12-31 ENCOUNTER — Ambulatory Visit (INDEPENDENT_AMBULATORY_CARE_PROVIDER_SITE_OTHER): Payer: Self-pay | Admitting: Orthopedic Surgery

## 2019-12-31 ENCOUNTER — Encounter: Payer: Self-pay | Admitting: Orthopedic Surgery

## 2019-12-31 VITALS — Ht 73.0 in | Wt 270.0 lb

## 2019-12-31 DIAGNOSIS — I872 Venous insufficiency (chronic) (peripheral): Secondary | ICD-10-CM

## 2019-12-31 DIAGNOSIS — L97411 Non-pressure chronic ulcer of right heel and midfoot limited to breakdown of skin: Secondary | ICD-10-CM

## 2019-12-31 DIAGNOSIS — Z89431 Acquired absence of right foot: Secondary | ICD-10-CM

## 2020-01-10 ENCOUNTER — Encounter: Payer: Self-pay | Admitting: Orthopedic Surgery
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
## 2020-01-18 ENCOUNTER — Ambulatory Visit: Payer: Self-pay | Admitting: Orthopedic Surgery

## 2020-02-28 ENCOUNTER — Emergency Department (HOSPITAL_BASED_OUTPATIENT_CLINIC_OR_DEPARTMENT_OTHER): Payer: Self-pay

## 2020-02-28 ENCOUNTER — Encounter (HOSPITAL_BASED_OUTPATIENT_CLINIC_OR_DEPARTMENT_OTHER): Payer: Self-pay | Admitting: Emergency Medicine

## 2020-02-28 ENCOUNTER — Other Ambulatory Visit: Payer: Self-pay

## 2020-02-28 ENCOUNTER — Inpatient Hospital Stay (HOSPITAL_BASED_OUTPATIENT_CLINIC_OR_DEPARTMENT_OTHER)
Admission: EM | Admit: 2020-02-28 | Discharge: 2020-03-11 | DRG: 853 | Disposition: A | Discharge status: Rehabilitation Facility | Payer: Self-pay | Attending: Internal Medicine | Admitting: Internal Medicine

## 2020-02-28 DIAGNOSIS — M86272 Subacute osteomyelitis, left ankle and foot: Secondary | ICD-10-CM

## 2020-02-28 DIAGNOSIS — M86172 Other acute osteomyelitis, left ankle and foot: Secondary | ICD-10-CM | POA: Diagnosis present

## 2020-02-28 DIAGNOSIS — E1165 Type 2 diabetes mellitus with hyperglycemia: Secondary | ICD-10-CM | POA: Diagnosis present

## 2020-02-28 DIAGNOSIS — I96 Gangrene, not elsewhere classified: Secondary | ICD-10-CM | POA: Diagnosis present

## 2020-02-28 DIAGNOSIS — N189 Chronic kidney disease, unspecified: Secondary | ICD-10-CM | POA: Diagnosis present

## 2020-02-28 DIAGNOSIS — E1142 Type 2 diabetes mellitus with diabetic polyneuropathy: Secondary | ICD-10-CM | POA: Diagnosis present

## 2020-02-28 DIAGNOSIS — E871 Hypo-osmolality and hyponatremia: Secondary | ICD-10-CM | POA: Diagnosis present

## 2020-02-28 DIAGNOSIS — Z20822 Contact with and (suspected) exposure to covid-19: Secondary | ICD-10-CM | POA: Diagnosis present

## 2020-02-28 DIAGNOSIS — Z89431 Acquired absence of right foot: Secondary | ICD-10-CM

## 2020-02-28 DIAGNOSIS — R6521 Severe sepsis with septic shock: Secondary | ICD-10-CM | POA: Diagnosis present

## 2020-02-28 DIAGNOSIS — G8918 Other acute postprocedural pain: Secondary | ICD-10-CM

## 2020-02-28 DIAGNOSIS — D62 Acute posthemorrhagic anemia: Secondary | ICD-10-CM | POA: Diagnosis not present

## 2020-02-28 DIAGNOSIS — R109 Unspecified abdominal pain: Secondary | ICD-10-CM | POA: Diagnosis not present

## 2020-02-28 DIAGNOSIS — A480 Gas gangrene: Secondary | ICD-10-CM | POA: Diagnosis present

## 2020-02-28 DIAGNOSIS — E46 Unspecified protein-calorie malnutrition: Secondary | ICD-10-CM | POA: Diagnosis present

## 2020-02-28 DIAGNOSIS — M726 Necrotizing fasciitis: Secondary | ICD-10-CM | POA: Diagnosis present

## 2020-02-28 DIAGNOSIS — D649 Anemia, unspecified: Secondary | ICD-10-CM

## 2020-02-28 DIAGNOSIS — Z9842 Cataract extraction status, left eye: Secondary | ICD-10-CM

## 2020-02-28 DIAGNOSIS — S98911A Complete traumatic amputation of right foot, level unspecified, initial encounter: Secondary | ICD-10-CM

## 2020-02-28 DIAGNOSIS — Z833 Family history of diabetes mellitus: Secondary | ICD-10-CM

## 2020-02-28 DIAGNOSIS — R059 Cough, unspecified: Secondary | ICD-10-CM

## 2020-02-28 DIAGNOSIS — N170 Acute kidney failure with tubular necrosis: Secondary | ICD-10-CM | POA: Diagnosis present

## 2020-02-28 DIAGNOSIS — B965 Pseudomonas (aeruginosa) (mallei) (pseudomallei) as the cause of diseases classified elsewhere: Secondary | ICD-10-CM | POA: Diagnosis present

## 2020-02-28 DIAGNOSIS — Z79899 Other long term (current) drug therapy: Secondary | ICD-10-CM

## 2020-02-28 DIAGNOSIS — E11621 Type 2 diabetes mellitus with foot ulcer: Secondary | ICD-10-CM | POA: Diagnosis present

## 2020-02-28 DIAGNOSIS — Z6841 Body Mass Index (BMI) 40.0 and over, adult: Secondary | ICD-10-CM

## 2020-02-28 DIAGNOSIS — S88112D Complete traumatic amputation at level between knee and ankle, left lower leg, subsequent encounter: Secondary | ICD-10-CM

## 2020-02-28 DIAGNOSIS — G4730 Sleep apnea, unspecified: Secondary | ICD-10-CM | POA: Diagnosis present

## 2020-02-28 DIAGNOSIS — R52 Pain, unspecified: Secondary | ICD-10-CM

## 2020-02-28 DIAGNOSIS — N179 Acute kidney failure, unspecified: Secondary | ICD-10-CM | POA: Diagnosis present

## 2020-02-28 DIAGNOSIS — D509 Iron deficiency anemia, unspecified: Secondary | ICD-10-CM | POA: Diagnosis present

## 2020-02-28 DIAGNOSIS — Z961 Presence of intraocular lens: Secondary | ICD-10-CM | POA: Diagnosis present

## 2020-02-28 DIAGNOSIS — R6520 Severe sepsis without septic shock: Secondary | ICD-10-CM | POA: Diagnosis present

## 2020-02-28 DIAGNOSIS — Z9841 Cataract extraction status, right eye: Secondary | ICD-10-CM

## 2020-02-28 DIAGNOSIS — L02612 Cutaneous abscess of left foot: Secondary | ICD-10-CM | POA: Diagnosis present

## 2020-02-28 DIAGNOSIS — E1152 Type 2 diabetes mellitus with diabetic peripheral angiopathy with gangrene: Secondary | ICD-10-CM | POA: Diagnosis present

## 2020-02-28 DIAGNOSIS — A419 Sepsis, unspecified organism: Secondary | ICD-10-CM | POA: Diagnosis present

## 2020-02-28 DIAGNOSIS — Z87891 Personal history of nicotine dependence: Secondary | ICD-10-CM

## 2020-02-28 DIAGNOSIS — Z794 Long term (current) use of insulin: Secondary | ICD-10-CM

## 2020-02-28 DIAGNOSIS — Z888 Allergy status to other drugs, medicaments and biological substances status: Secondary | ICD-10-CM

## 2020-02-28 DIAGNOSIS — E875 Hyperkalemia: Secondary | ICD-10-CM | POA: Diagnosis present

## 2020-02-28 DIAGNOSIS — E1122 Type 2 diabetes mellitus with diabetic chronic kidney disease: Secondary | ICD-10-CM | POA: Diagnosis present

## 2020-02-28 DIAGNOSIS — L089 Local infection of the skin and subcutaneous tissue, unspecified: Secondary | ICD-10-CM

## 2020-02-28 DIAGNOSIS — I129 Hypertensive chronic kidney disease with stage 1 through stage 4 chronic kidney disease, or unspecified chronic kidney disease: Secondary | ICD-10-CM | POA: Diagnosis present

## 2020-02-28 DIAGNOSIS — R0600 Dyspnea, unspecified: Secondary | ICD-10-CM

## 2020-02-28 DIAGNOSIS — E1169 Type 2 diabetes mellitus with other specified complication: Secondary | ICD-10-CM | POA: Diagnosis present

## 2020-02-28 DIAGNOSIS — L97529 Non-pressure chronic ulcer of other part of left foot with unspecified severity: Secondary | ICD-10-CM | POA: Diagnosis present

## 2020-02-28 DIAGNOSIS — A408 Other streptococcal sepsis: Principal | ICD-10-CM | POA: Diagnosis present

## 2020-02-28 LAB — COMPREHENSIVE METABOLIC PANEL
ALT: 29 U/L (ref 0–44)
AST: 32 U/L (ref 15–41)
Albumin: 2.7 g/dL — ABNORMAL LOW (ref 3.5–5.0)
Alkaline Phosphatase: 89 U/L (ref 38–126)
Anion gap: 11 (ref 5–15)
BUN: 33 mg/dL — ABNORMAL HIGH (ref 6–20)
CO2: 21 mmol/L — ABNORMAL LOW (ref 22–32)
Calcium: 8.3 mg/dL — ABNORMAL LOW (ref 8.9–10.3)
Chloride: 94 mmol/L — ABNORMAL LOW (ref 98–111)
Creatinine, Ser: 3.58 mg/dL — ABNORMAL HIGH (ref 0.61–1.24)
GFR, Estimated: 22 mL/min — ABNORMAL LOW (ref >60)
Glucose, Bld: 228 mg/dL — ABNORMAL HIGH (ref 70–99)
Potassium: 4.6 mmol/L (ref 3.5–5.1)
Sodium: 126 mmol/L — ABNORMAL LOW (ref 135–145)
Total Bilirubin: 0.6 mg/dL (ref 0.3–1.2)
Total Protein: 8.3 g/dL — ABNORMAL HIGH (ref 6.5–8.1)

## 2020-02-28 LAB — LACTIC ACID, PLASMA
Lactic Acid, Venous: 1.5 mmol/L (ref 0.5–1.9)
Lactic Acid, Venous: 1.6 mmol/L (ref 0.5–1.9)

## 2020-02-28 LAB — CBC WITH DIFFERENTIAL/PLATELET
Abs Immature Granulocytes: 0.16 10*3/uL — ABNORMAL HIGH (ref 0.00–0.07)
Basophils Absolute: 0 10*3/uL (ref 0.0–0.1)
Basophils Relative: 0 %
Eosinophils Absolute: 0 10*3/uL (ref 0.0–0.5)
Eosinophils Relative: 0 %
HCT: 28.8 % — ABNORMAL LOW (ref 39.0–52.0)
Hemoglobin: 9.3 g/dL — ABNORMAL LOW (ref 13.0–17.0)
Immature Granulocytes: 1 %
Lymphocytes Relative: 5 %
Lymphs Abs: 0.8 10*3/uL (ref 0.7–4.0)
MCH: 27.4 pg (ref 26.0–34.0)
MCHC: 32.3 g/dL (ref 30.0–36.0)
MCV: 84.7 fL (ref 80.0–100.0)
Monocytes Absolute: 0.5 10*3/uL (ref 0.1–1.0)
Monocytes Relative: 3 %
Neutro Abs: 14.8 10*3/uL — ABNORMAL HIGH (ref 1.7–7.7)
Neutrophils Relative %: 91 %
Platelets: 345 10*3/uL (ref 150–400)
RBC: 3.4 MIL/uL — ABNORMAL LOW (ref 4.22–5.81)
RDW: 14.7 % (ref 11.5–15.5)
WBC: 16.2 10*3/uL — ABNORMAL HIGH (ref 4.0–10.5)
nRBC: 0 % (ref 0.0–0.2)

## 2020-02-28 LAB — CBG MONITORING, ED: Glucose-Capillary: 218 mg/dL — ABNORMAL HIGH (ref 70–99)

## 2020-02-28 LAB — PROTIME-INR
INR: 1.3 — ABNORMAL HIGH (ref 0.8–1.2)
Prothrombin Time: 15.5 seconds — ABNORMAL HIGH (ref 11.4–15.2)

## 2020-02-28 LAB — RESPIRATORY PANEL BY RT PCR (FLU A&B, COVID)
Influenza A by PCR: NEGATIVE
Influenza B by PCR: NEGATIVE
SARS Coronavirus 2 by RT PCR: NEGATIVE

## 2020-02-28 LAB — GLUCOSE, CAPILLARY: Glucose-Capillary: 121 mg/dL — ABNORMAL HIGH (ref 70–99)

## 2020-02-28 MED ORDER — LACTATED RINGERS IV BOLUS (SEPSIS): 1000.0000 mL | Freq: Once | INTRAVENOUS | Status: DC

## 2020-02-28 MED ORDER — SODIUM CHLORIDE 0.9 % IV SOLN
2.0000 g | INTRAVENOUS | Status: DC
  Administered 2020-02-28: 2 g via INTRAVENOUS
  Filled 2020-02-28: qty 20

## 2020-02-28 MED ORDER — ACETAMINOPHEN 325 MG PO TABS
650.0000 mg | ORAL_TABLET | Freq: Four times a day (QID) | ORAL | Status: DC | PRN
  Administered 2020-02-29 – 2020-03-02 (×4): 650 mg via ORAL
  Filled 2020-02-28 (×4): qty 2

## 2020-02-28 MED ORDER — LACTATED RINGERS IV SOLN: INTRAVENOUS | Status: DC

## 2020-02-28 MED ORDER — HYDROCODONE-ACETAMINOPHEN 5-325 MG PO TABS
1.0000 | ORAL_TABLET | ORAL | Status: DC | PRN
  Administered 2020-02-28 – 2020-03-01 (×5): 1 via ORAL
  Filled 2020-02-28 (×5): qty 1

## 2020-02-28 MED ORDER — ZOLPIDEM TARTRATE 5 MG PO TABS
5.0000 mg | ORAL_TABLET | Freq: Every evening | ORAL | Status: DC | PRN
  Administered 2020-02-28 – 2020-03-09 (×5): 5 mg via ORAL
  Filled 2020-02-28 (×6): qty 1

## 2020-02-28 MED ORDER — VANCOMYCIN HCL IN DEXTROSE 1-5 GM/200ML-% IV SOLN
1000.0000 mg | INTRAVENOUS | Status: AC
  Administered 2020-02-28 (×2): 1000 mg via INTRAVENOUS
  Filled 2020-02-28: qty 200

## 2020-02-28 MED ORDER — INSULIN ASPART 100 UNIT/ML ~~LOC~~ SOLN
0.0000 [IU] | Freq: Every day | SUBCUTANEOUS | Status: DC
  Administered 2020-03-02: 2 [IU] via SUBCUTANEOUS

## 2020-02-28 MED ORDER — ONDANSETRON HCL 4 MG/2ML IJ SOLN: 4.0000 mg | Freq: Four times a day (QID) | INTRAMUSCULAR | Status: DC | PRN

## 2020-02-28 MED ORDER — VANCOMYCIN VARIABLE DOSE PER UNSTABLE RENAL FUNCTION (PHARMACIST DOSING)
Status: DC
  Filled 2020-02-28: qty 1

## 2020-02-28 MED ORDER — ACETAMINOPHEN 650 MG RE SUPP: 650.0000 mg | Freq: Four times a day (QID) | RECTAL | Status: DC | PRN

## 2020-02-28 MED ORDER — HEPARIN SODIUM (PORCINE) 5000 UNIT/ML IJ SOLN
5000.0000 [IU] | Freq: Three times a day (TID) | INTRAMUSCULAR | Status: DC
  Administered 2020-02-28 – 2020-03-02 (×8): 5000 [IU] via SUBCUTANEOUS
  Filled 2020-02-28 (×8): qty 1

## 2020-02-28 MED ORDER — SODIUM CHLORIDE 0.9 % IV BOLUS
2400.0000 mL | Freq: Once | INTRAVENOUS | Status: AC
  Administered 2020-02-28: 2400 mL via INTRAVENOUS

## 2020-02-28 MED ORDER — LACTATED RINGERS IV BOLUS (SEPSIS): 400.0000 mL | Freq: Once | INTRAVENOUS | Status: DC

## 2020-02-28 MED ORDER — SODIUM CHLORIDE 0.9 % IV SOLN
2.0000 g | Freq: Two times a day (BID) | INTRAVENOUS | Status: DC
  Administered 2020-02-28 – 2020-02-29 (×2): 2 g via INTRAVENOUS
  Filled 2020-02-28 (×2): qty 2

## 2020-02-28 MED ORDER — ONDANSETRON HCL 4 MG PO TABS: 4.0000 mg | ORAL_TABLET | Freq: Four times a day (QID) | ORAL | Status: DC | PRN

## 2020-02-28 MED ORDER — METRONIDAZOLE 500 MG PO TABS
500.0000 mg | ORAL_TABLET | Freq: Three times a day (TID) | ORAL | Status: DC
  Administered 2020-02-28 – 2020-03-01 (×6): 500 mg via ORAL
  Filled 2020-02-28 (×6): qty 1

## 2020-02-28 MED ORDER — INSULIN ASPART 100 UNIT/ML ~~LOC~~ SOLN
0.0000 [IU] | Freq: Three times a day (TID) | SUBCUTANEOUS | Status: DC
  Administered 2020-02-29: 1 [IU] via SUBCUTANEOUS
  Administered 2020-03-03: 2 [IU] via SUBCUTANEOUS
  Administered 2020-03-03: 1 [IU] via SUBCUTANEOUS
  Administered 2020-03-03 – 2020-03-04 (×2): 2 [IU] via SUBCUTANEOUS
  Administered 2020-03-04 – 2020-03-10 (×13): 1 [IU] via SUBCUTANEOUS

## 2020-02-28 MED ORDER — ACETAMINOPHEN 325 MG PO TABS
650.0000 mg | ORAL_TABLET | Freq: Once | ORAL | Status: AC
  Administered 2020-02-28: 650 mg via ORAL
  Filled 2020-02-28: qty 2

## 2020-02-28 NOTE — ED Notes (Signed)
I re-dress both of his feet. His right foot has a new clean surgical wound--all of his toes having been amputated. His left foor has what appears to be a chronic, open suppurative ulcer at the lateral aspect, along his 5th metatarsal. I applied a sterile dressing to same. At no time has he been anything but alert, oriented x 4 ad articulate, and continues to be so.

## 2020-02-28 NOTE — Progress Notes (Signed)
MD Julian Reil made aware of patient's HR. Will continue to monitor the patient    02/28/20 1933  Assess: MEWS Score  Temp 99.8 F (37.7 C)  BP (!) 145/87  Pulse Rate (!) 113  Resp 20  Level of Consciousness Alert  SpO2 98 %  O2 Device Room Air

## 2020-02-28 NOTE — Progress Notes (Signed)
New Admission Note:   Arrival Method: [facility] via [facility] Mental Orientation: Alert and oriented x4 Telemetry: 5M01, CCMD notified Assessment: to be completed Skin: refer  to flowsheet IV: RAC and L hand, IVF infusing Pain: 6/10, will medicate Tubes: None Safety Measures: Safety Fall Prevention Plan has been discussed  Admission: to be completed 5 [place] Orientation: Patient has been oriented to the room, unit and staff.   Family: none at bedside  Orders to be reviewed and implemented. Will continue to monitor the patient. Call light has been placed within reach and bed alarm has been activated.

## 2020-02-28 NOTE — Progress Notes (Signed)
Pharmacy Antibiotic Note  Ronnie Shaw is a 39 y.o. male admitted on 02/28/2020 with wound infection.  Pharmacy has been consulted for vancomycin dosing. Pt is febrile and WBC is elevated at 16.2. SCr is also well above patients baseline.   Plan: Vancomycin 2gm IV x 1 then trend Scr for further doses F/u renal fxn, C&S, clinical status and trough at SS  Height: 6\' 1"  (185.4 cm) Weight: (!) 151.3 kg (333 lb 9.6 oz) IBW/kg (Calculated) : 79.9  Temp (24hrs), Avg:102.2 F (39 C), Min:102.2 F (39 C), Max:102.2 F (39 C)  Recent Labs  Lab 02/28/20 1140  WBC 16.2*  CREATININE 3.58*  LATICACIDVEN 1.5    Estimated Creatinine Clearance: 43.4 mL/min (A) (by C-G formula based on SCr of 3.58 mg/dL (H)).    Allergies  Allergen Reactions  . Prednisone Other (See Comments)    "makes me go blind", "that's how I got cataracts".    Antimicrobials this admission: Vanc 11/14>> CTX 11/14>> Flagyl 11/14>>  Dose adjustments this admission: N/A  Microbiology results: Pending  Thank you for allowing pharmacy to be a part of this patient's care.  Donni Oglesby, 03/01/20 02/28/2020 12:48 PM
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
## 2020-02-28 NOTE — Progress Notes (Signed)
Pharmacy Antibiotic Note  Ronnie Shaw is a 39 y.o. male admitted on 02/28/2020 with wound infection.  Pharmacy has been consulted for vancomycin dosing. Pt is febrile and WBC is elevated at 16.2. SCr is also well above patients baseline.   Plan: Vancomycin 2gm IV x 1 then trend Scr for further doses F/u renal fxn, C&S, clinical status and trough at SS  Addendum: Changing ceftriaxone to cefepime 2gm IV Q12H  Height: 6\' 1"  (185.4 cm) Weight: (!) 151.3 kg (333 lb 9.6 oz) IBW/kg (Calculated) : 79.9  Temp (24hrs), Avg:99.9 F (37.7 C), Min:98.6 F (37 C), Max:102.2 F (39 C)  Recent Labs  Lab 02/28/20 1140 02/28/20 1540  WBC 16.2*  --   CREATININE 3.58*  --   LATICACIDVEN 1.5 1.6    Estimated Creatinine Clearance: 43.4 mL/min (A) (by C-G formula based on SCr of 3.58 mg/dL (H)).    Allergies  Allergen Reactions  . Prednisone Other (See Comments)    "makes me go blind", "that's how I got cataracts".    Antimicrobials this admission: Vanc 11/14>> CTX 11/14>> Flagyl 11/14>>  Dose adjustments this admission: N/A  Microbiology results: Pending  Thank you for allowing pharmacy to be a part of this patient's care.  Ahniya Mitchum, 03/01/20 02/28/2020 7:39 PM

## 2020-02-28 NOTE — ED Notes (Signed)
Called Ruby with [facility] and requested consult to [facility] Is Dr. Lajoyce Corners.
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
## 2020-02-28 NOTE — ED Notes (Signed)
[facility] loads him onto their stretcher at this time to transport to [facility].

## 2020-02-28 NOTE — ED Triage Notes (Addendum)
Vomiting x 3 days with generalized weakness. Appears SOB. He also has a wound on his foot that he states is not doing well. He is diabetic.

## 2020-02-28 NOTE — ED Notes (Signed)
I have given report to [facility] and to Hope, Charity fundraiser at [facility].

## 2020-02-29 ENCOUNTER — Inpatient Hospital Stay (HOSPITAL_COMMUNITY): Payer: Self-pay

## 2020-02-29 DIAGNOSIS — A419 Sepsis, unspecified organism: Secondary | ICD-10-CM

## 2020-02-29 DIAGNOSIS — L97529 Non-pressure chronic ulcer of other part of left foot with unspecified severity: Secondary | ICD-10-CM

## 2020-02-29 DIAGNOSIS — R6520 Severe sepsis without septic shock: Secondary | ICD-10-CM

## 2020-02-29 DIAGNOSIS — I96 Gangrene, not elsewhere classified: Secondary | ICD-10-CM

## 2020-02-29 DIAGNOSIS — L0390 Cellulitis, unspecified: Secondary | ICD-10-CM

## 2020-02-29 DIAGNOSIS — E1152 Type 2 diabetes mellitus with diabetic peripheral angiopathy with gangrene: Secondary | ICD-10-CM

## 2020-02-29 DIAGNOSIS — E1142 Type 2 diabetes mellitus with diabetic polyneuropathy: Secondary | ICD-10-CM

## 2020-02-29 DIAGNOSIS — E11621 Type 2 diabetes mellitus with foot ulcer: Secondary | ICD-10-CM

## 2020-02-29 LAB — BLOOD CULTURE ID PANEL (REFLEXED) - BCID2

## 2020-02-29 LAB — HIV ANTIBODY (ROUTINE TESTING W REFLEX): HIV Screen 4th Generation wRfx: NONREACTIVE

## 2020-02-29 LAB — HEMOGLOBIN A1C
Hgb A1c MFr Bld: 6.4 % — ABNORMAL HIGH (ref 4.8–5.6)
Mean Plasma Glucose: 136.98 mg/dL

## 2020-02-29 LAB — URINALYSIS, ROUTINE W REFLEX MICROSCOPIC
Bilirubin Urine: NEGATIVE
Glucose, UA: NEGATIVE mg/dL
Ketones, ur: NEGATIVE mg/dL
Leukocytes,Ua: NEGATIVE
Nitrite: NEGATIVE
Protein, ur: 100 mg/dL — AB
Specific Gravity, Urine: 1.016 (ref 1.005–1.030)
pH: 5 (ref 5.0–8.0)

## 2020-02-29 LAB — CBC
HCT: 24.7 % — ABNORMAL LOW (ref 39.0–52.0)
Hemoglobin: 7.9 g/dL — ABNORMAL LOW (ref 13.0–17.0)
MCH: 27.3 pg (ref 26.0–34.0)
MCHC: 32 g/dL (ref 30.0–36.0)
MCV: 85.5 fL (ref 80.0–100.0)
Platelets: 278 10*3/uL (ref 150–400)
RBC: 2.89 MIL/uL — ABNORMAL LOW (ref 4.22–5.81)
RDW: 15 % (ref 11.5–15.5)
WBC: 15.3 10*3/uL — ABNORMAL HIGH (ref 4.0–10.5)
nRBC: 0 % (ref 0.0–0.2)

## 2020-02-29 LAB — COMPREHENSIVE METABOLIC PANEL
ALT: 33 U/L (ref 0–44)
AST: 39 U/L (ref 15–41)
Albumin: 2 g/dL — ABNORMAL LOW (ref 3.5–5.0)
Alkaline Phosphatase: 84 U/L (ref 38–126)
Anion gap: 9 (ref 5–15)
BUN: 35 mg/dL — ABNORMAL HIGH (ref 6–20)
CO2: 20 mmol/L — ABNORMAL LOW (ref 22–32)
Calcium: 8.1 mg/dL — ABNORMAL LOW (ref 8.9–10.3)
Chloride: 101 mmol/L (ref 98–111)
Creatinine, Ser: 3.18 mg/dL — ABNORMAL HIGH (ref 0.61–1.24)
GFR, Estimated: 25 mL/min — ABNORMAL LOW (ref >60)
Glucose, Bld: 130 mg/dL — ABNORMAL HIGH (ref 70–99)
Potassium: 4.1 mmol/L (ref 3.5–5.1)
Sodium: 130 mmol/L — ABNORMAL LOW (ref 135–145)
Total Bilirubin: 0.5 mg/dL (ref 0.3–1.2)
Total Protein: 7 g/dL (ref 6.5–8.1)

## 2020-02-29 LAB — PROCALCITONIN: Procalcitonin: 20.82 ng/mL

## 2020-02-29 LAB — NA AND K (SODIUM & POTASSIUM), RAND UR
Potassium Urine: 36 mmol/L
Sodium, Ur: 14 mmol/L

## 2020-02-29 LAB — PROTIME-INR
INR: 1.4 — ABNORMAL HIGH (ref 0.8–1.2)
Prothrombin Time: 16.4 seconds — ABNORMAL HIGH (ref 11.4–15.2)

## 2020-02-29 LAB — GLUCOSE, CAPILLARY
Glucose-Capillary: 125 mg/dL — ABNORMAL HIGH (ref 70–99)
Glucose-Capillary: 101 mg/dL — ABNORMAL HIGH (ref 70–99)
Glucose-Capillary: 87 mg/dL (ref 70–99)
Glucose-Capillary: 110 mg/dL — ABNORMAL HIGH (ref 70–99)

## 2020-02-29 LAB — CORTISOL-AM, BLOOD: Cortisol - AM: 18.2 ug/dL (ref 6.7–22.6)

## 2020-02-29 LAB — CK: Total CK: 979 U/L — ABNORMAL HIGH (ref 49–397)

## 2020-02-29 MED ORDER — ADULT MULTIVITAMIN W/MINERALS CH
1.0000 | ORAL_TABLET | Freq: Every day | ORAL | Status: DC
  Administered 2020-02-29 – 2020-03-11 (×12): 1 via ORAL
  Filled 2020-02-29 (×12): qty 1

## 2020-02-29 MED ORDER — VANCOMYCIN VARIABLE DOSE PER UNSTABLE RENAL FUNCTION (PHARMACIST DOSING): Status: DC

## 2020-02-29 MED ORDER — SODIUM CHLORIDE 0.9 % IV SOLN
650.0000 mg | Freq: Every day | INTRAVENOUS | Status: DC
  Administered 2020-02-29 – 2020-03-06 (×7): 650 mg via INTRAVENOUS
  Filled 2020-02-29 (×8): qty 13

## 2020-02-29 MED ORDER — ENSURE MAX PROTEIN PO LIQD
11.0000 [oz_av] | Freq: Three times a day (TID) | ORAL | Status: DC
  Administered 2020-03-01 – 2020-03-05 (×12): 11 [oz_av] via ORAL
  Filled 2020-02-29 (×18): qty 330

## 2020-02-29 MED ORDER — LACTATED RINGERS IV SOLN: INTRAVENOUS | Status: AC

## 2020-02-29 MED ORDER — SODIUM CHLORIDE 0.9 % IV SOLN
2.0000 g | INTRAVENOUS | Status: DC
  Administered 2020-02-29 – 2020-03-03 (×4): 2 g via INTRAVENOUS
  Filled 2020-02-29 (×4): qty 20

## 2020-02-29 NOTE — Plan of Care (Signed)
  Problem: Education: Goal: Knowledge of General Education information will improve Description Including pain rating scale, medication(s)/side effects and non-pharmacologic comfort measures Outcome: Progressing   

## 2020-02-29 NOTE — Progress Notes (Signed)
Inpatient Diabetes Program Recommendations  AACE/ADA: New Consensus Statement on Inpatient Glycemic Control (2015)  Target Ranges:  Prepandial:   less than 140 mg/dL      Peak postprandial:   less than 180 mg/dL (1-2 hours)      Critically ill patients:  140 - 180 mg/dL   Lab Results  Component Value Date   GLUCAP 101 (H) 02/29/2020   HGBA1C 6.4 (H) 02/29/2020    Review of Glycemic Control Results for Ronnie Shaw, Ronnie Shaw (MRN [id number]) as of 02/29/2020 12:13  Ref. Range 10/30/2019 09:15 02/28/2020 11:22 02/28/2020 19:47 02/29/2020 06:58 02/29/2020 11:39  Glucose-Capillary Latest Ref Range: 70 - 99 mg/dL 409 (H) 811 (H) 914 (H) 125 (H) 101 (H)   Diabetes history: T2DM Outpatient Diabetes medications:  None/diet controlled Current orders for Inpatient glycemic control:  Novolog 0-9units tid Novolog 0-5 qhs   Note: Spoke with patient briefly at bedside.  He is tearful and upset wondering why this is happening.  He is frustrated because he has been trying very hard to control his blood sugar.  His A1C was 13% in June 2019.  He started on insulin then and was able to bring his A1C down to 6.0% in September 2019.  Currently his A1C is 6.4.  He eats a lot of fish and watches his CHO's.  He is not on any DM medications.  He does not want to talk right now.   Will continue to follow while inpatient.  Thank you, Dulce Sellar, RN, BSN Diabetes Coordinator Inpatient Diabetes Program [phone] (team pager from 8a-5p)
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
## 2020-02-29 NOTE — Progress Notes (Signed)
Initial Nutrition Assessment  DOCUMENTATION CODES:   Morbid obesity  INTERVENTION:   Ensure Max po TID, each supplement provides 150 kcal and 30 grams of protein  MVI daily  Provided diabetic diet education and discussed importance of adequate intake for wound healing    NUTRITION DIAGNOSIS:   Increased nutrient needs related to wound healing as evidenced by estimated needs.    GOAL:   Patient will meet greater than or equal to 90% of their needs    MONITOR:   PO intake, Supplement acceptance, Skin, Weight trends, Labs, I & O's  REASON FOR ASSESSMENT:   Consult Wound healing  ASSESSMENT:   Pt with PMH significant for type 2 DM and diabetic foot ulcers s/p R midfoot amputation admitted with severe sepsis with AKI 2/2 diabetic foot ulcer.  Per MD, pt also with possible PNA noted on CXR.   Pt reports generalized weakness, nausea, and vomiting since 3 days PTA. Pt with poor po intake over the last few days. At baseline, pt often eats only 1 meal per day. Pt denies any significant wt changes and reports his wt fluctuates regularly. Pt agreeable to use of oral nutrition supplements while admitted and after d/c; reviewed best options together. Also provided brief diabetic diet education.   No PO intake documented.   Reviewed wt history; no significant wt loss noted.   UOP: documented x24 hours  Labs: Na 126 (L), CBGs 121-125 Medications: ss novolog  NUTRITION - FOCUSED PHYSICAL EXAM:    Most Recent Value  Orbital Region No depletion  Upper Arm Region No depletion  Thoracic and Lumbar Region No depletion  Buccal Region No depletion  Temple Region No depletion  Clavicle Bone Region No depletion  Clavicle and Acromion Bone Region No depletion  Scapular Bone Region No depletion  Dorsal Hand No depletion  Patellar Region No depletion  Anterior Thigh Region No depletion  Posterior Calf Region No depletion  Edema (RD Assessment) None  Hair Reviewed  Eyes  Reviewed  Mouth Reviewed  Skin Reviewed  Nails Reviewed       Diet Order:   Diet Order            Diet Carb Modified Fluid consistency: Thin; Room service appropriate? Yes  Diet effective now                 EDUCATION NEEDS:   Education needs have been addressed  Skin:  Skin Assessment: Skin Integrity Issues: Skin Integrity Issues:: Diabetic Ulcer Diabetic Ulcer: L foot  Last BM:  11/14  Height:   Ht Readings from Last 1 Encounters:  02/28/20 6\' 1"  (1.854 m)    Weight:   Wt Readings from Last 1 Encounters:  02/28/20 (!) 151.3 kg    Ideal Body Weight:  83.64 kg  BMI:  Body mass index is 44.01 kg/m.  Estimated Nutritional Needs:   Kcal:  2700-2900  Protein:  190-210 grams  Fluid:  >2.7L/d    03/01/20, MS, RD, LDN RD pager number and weekend/on-call pager number located in Amion.

## 2020-02-29 NOTE — Discharge Instructions (Signed)

## 2020-02-29 NOTE — Progress Notes (Signed)
PROGRESS NOTE    Ronnie Shaw  KJZ:[id number] DOB: 11-06-1980 DOA: 02/28/2020 PCP: Gildardo Pounds, NP  Brief Narrative: Ronnie Shaw is a 39 year old male with history of obesity, type 2 diabetes mellitus, diabetic foot ulcers with right foot transmetatarsal amputation in the past presented to the ED with fevers chills nausea vomiting cough and worsening pain in his left foot -In the emergency room he was febrile, met sepsis criteria, work-up was concerning for necrotic wound on his left foot and acute kidney injury creatinine of 3.5   Assessment & Plan:   Sepsis poa Necrotic diabetic foot ulcer -Continue IV fluids, cut down rate, continue cefepime, will discontinue vancomycin on account of severe AKI and start daptomycin -Orthopedics Dr. Sharol Given consulting, plan for foot salvage intervention with fourth and fifth ray amputation on Wednesday  Acute kidney injury -Creatinine 3.5 on admission, baseline is 1.6 from 7/21  -could be ATN from sepsis -No NSAID use, no offending meds noted urinalysis with 100 proteinuria, hyaline casts present -Continue IV fluids, renal dose medications, stop vancomycin -Monitor kidney function and urine output closely  Obesity Type 2 diabetes mellitus -BMI is 44, weight loss encouraged -Hemoglobin A1c is 6.4, continue sliding scale insulin  Abnormal chest x-ray -No symptoms of pneumonia, Covid negative -Add incentive spirometry, recheck x-ray in 2 to 3 days or if develops symptoms  Social -Does not have a PCP, uninsured, will ask case management for input  DVT prophylaxis: Heparin subcutaneous Code Status: Full code Family Communication: Discussed with patient in detail, no family at bedside Disposition Plan:  Status is: Inpatient  Remains inpatient appropriate because:Inpatient level of care appropriate due to severity of illness   Dispo: The patient is from: Home              Anticipated d/c is to: Home              Anticipated d/c date  is: > 3 days              Patient currently is not medically stable to d/c.  Consultants:   Orthopedics Dr. Sharol Given   Procedures:   Antimicrobials:    Subjective: -Feels poorly, tired, did not sleep well last night  Objective: Vitals:   02/28/20 2351 02/29/20 0340 02/29/20 0755 02/29/20 1035  BP: 112/76 (!) 144/81 125/70 133/81  Pulse: (!) 111 (!) 108 (!) 105 (!) 114  Resp: '18 20 18 18  ' Temp: (!) 100.5 F (38.1 C) (!) 101.8 F (38.8 C) 99.9 F (37.7 C) 100.3 F (37.9 C)  TempSrc: Oral Oral    SpO2: 98% 94% 97% 100%  Weight:      Height:        Intake/Output Summary (Last 24 hours) at 02/29/2020 1109 Last data filed at 02/29/2020 0900 Gross per 24 hour  Intake 2458.79 ml  Output 350 ml  Net 2108.79 ml   Filed Weights   02/28/20 1120  Weight: (!) 151.3 kg    Examination:  General exam: Obese male sitting up in bed, AAOx3, no distress CVS: S1-S2, regular rate rhythm Lungs: Distant breath sounds, otherwise clear Abdomen: Soft, obese, nontender, bowel sounds present Extremities: Right foot with transmetatarsal amputation discoloration at the margins, Left foot with large ulcer, black necrotic wound edges  Psychiatry: Mood & affect appropriate.   Data Reviewed:   CBC: Recent Labs  Lab 02/28/20 1140 02/29/20 0714  WBC 16.2* 15.3*  NEUTROABS 14.8*  --   HGB 9.3* 7.9*  HCT 28.8* 24.7*  MCV 84.7  85.5  PLT 345 213   Basic Metabolic Panel: Recent Labs  Lab 02/28/20 1140 02/29/20 0714  NA 126* 130*  K 4.6 4.1  CL 94* 101  CO2 21* 20*  GLUCOSE 228* 130*  BUN 33* 35*  CREATININE 3.58* 3.18*  CALCIUM 8.3* 8.1*   GFR: Estimated Creatinine Clearance: 48.8 mL/min (A) (by C-G formula based on SCr of 3.18 mg/dL (H)). Liver Function Tests: Recent Labs  Lab 02/28/20 1140 02/29/20 0714  AST 32 39  ALT 29 33  ALKPHOS 89 84  BILITOT 0.6 0.5  PROT 8.3* 7.0  ALBUMIN 2.7* 2.0*   No results for input(s): LIPASE, AMYLASE in the last 168 hours. No  results for input(s): AMMONIA in the last 168 hours. Coagulation Profile: Recent Labs  Lab 02/28/20 1140 02/29/20 0714  INR 1.3* 1.4*   Cardiac Enzymes: No results for input(s): CKTOTAL, CKMB, CKMBINDEX, TROPONINI in the last 168 hours. BNP (last 3 results) No results for input(s): PROBNP in the last 8760 hours. HbA1C: Recent Labs    02/29/20 0714  HGBA1C 6.4*   CBG: Recent Labs  Lab 02/28/20 1122 02/28/20 1947 02/29/20 0658  GLUCAP 218* 121* 125*   Lipid Profile: No results for input(s): CHOL, HDL, LDLCALC, TRIG, CHOLHDL, LDLDIRECT in the last 72 hours. Thyroid Function Tests: No results for input(s): TSH, T4TOTAL, FREET4, T3FREE, THYROIDAB in the last 72 hours. Anemia Panel: No results for input(s): VITAMINB12, FOLATE, FERRITIN, TIBC, IRON, RETICCTPCT in the last 72 hours. Urine analysis:    Component Value Date/Time   COLORURINE YELLOW 02/29/2020 0447   APPEARANCEUR CLOUDY (A) 02/29/2020 0447   LABSPEC 1.016 02/29/2020 0447   PHURINE 5.0 02/29/2020 0447   GLUCOSEU NEGATIVE 02/29/2020 0447   HGBUR MODERATE (A) 02/29/2020 0447   BILIRUBINUR NEGATIVE 02/29/2020 [place] 02/29/2020 0447   PROTEINUR 100 (A) 02/29/2020 0447   UROBILINOGEN 1.0 12/18/2014 0533   NITRITE NEGATIVE 02/29/2020 0447   LEUKOCYTESUR NEGATIVE 02/29/2020 0447   Sepsis Labs: '@LABRCNTIP' (procalcitonin:4,lacticidven:4)  ) Recent Results (from the past 240 hour(s))  Culture, blood (Routine x 2)     Status: None (Preliminary result)   Collection Time: 02/28/20 11:40 AM   Specimen: BLOOD  Result Value Ref Range Status   Specimen Description   Final    BLOOD RIGHT ANTECUBITAL Performed at [facility], [place] [address] [place], [place] [address]    Special Requests   Final    BOTTLES DRAWN AEROBIC AND ANAEROBIC Blood Culture adequate volume Performed at [facility], [place] [place], [place] [address]    Culture PENDING  Incomplete   Report Status  PENDING  Incomplete  Respiratory Panel by RT PCR (Flu A&B, Covid) - Nasopharyngeal Swab     Status: None   Collection Time: 02/28/20 12:29 PM   Specimen: Nasopharyngeal Swab  Result Value Ref Range Status   SARS Coronavirus 2 by RT PCR NEGATIVE NEGATIVE Final    Comment: (NOTE) SARS-CoV-2 target nucleic acids are NOT DETECTED.  The SARS-CoV-2 RNA is generally detectable in upper respiratoy specimens during the acute phase of infection. The lowest concentration of SARS-CoV-2 viral copies this assay can detect is 131 copies/mL. A negative result does not preclude SARS-Cov-2 infection and should not be used as the sole basis for treatment or other patient management decisions. A negative result may occur with  improper specimen collection/handling, submission of specimen other than nasopharyngeal swab, presence of viral mutation(s) within the areas targeted by this assay,  and inadequate number of viral copies (<131 copies/mL). A negative result must be combined with clinical observations, patient history, and epidemiological information. The expected result is Negative.  Fact Sheet for Patients:  PinkCheek.be  Fact Sheet for Healthcare Providers:  GravelBags.it  This test is no t yet approved or cleared by the [place] FDA and  has been authorized for detection and/or diagnosis of SARS-CoV-2 by FDA under an Emergency Use Authorization (EUA). This EUA will remain  in effect (meaning this test can be used) for the duration of the COVID-19 declaration under Section 564(b)(1) of the Act, 21 U.S.C. section 360bbb-3(b)(1), unless the authorization is terminated or revoked sooner.     Influenza A by PCR NEGATIVE NEGATIVE Final   Influenza B by PCR NEGATIVE NEGATIVE Final    Comment: (NOTE) The Xpert Xpress SARS-CoV-2/FLU/RSV assay is intended as an aid in  the diagnosis of influenza from Nasopharyngeal swab specimens and    should not be used as a sole basis for treatment. Nasal washings and  aspirates are unacceptable for Xpert Xpress SARS-CoV-2/FLU/RSV  testing.  Fact Sheet for Patients: PinkCheek.be  Fact Sheet for Healthcare Providers: GravelBags.it  This test is not yet approved or cleared by the [place] FDA and  has been authorized for detection and/or diagnosis of SARS-CoV-2 by  FDA under an Emergency Use Authorization (EUA). This EUA will remain  in effect (meaning this test can be used) for the duration of the  Covid-19 declaration under Section 564(b)(1) of the Act, 21  U.S.C. section 360bbb-3(b)(1), unless the authorization is  terminated or revoked. Performed at [facility], [address], [place], [place] [address]          Radiology Studies: DG Chest [place] 1 View  Result Date: 02/28/2020 CLINICAL DATA:  Fever and shortness of breath EXAM: PORTABLE CHEST 1 VIEW COMPARISON:  January 02, 2018 FINDINGS: There is ill-defined airspace opacity in the medial right base. The lungs elsewhere are clear. Heart is upper normal in size with pulmonary vascularity normal. No adenopathy. No bone lesions. IMPRESSION: Ill-defined opacity medial right base concerning for focus of pneumonia. Lungs elsewhere clear. Heart upper normal in size. No adenopathy. Correlation with COVID-19 status advised given the findings in the right base. Electronically Signed   By: Lowella Grip III M.D.   On: 02/28/2020 12:22   DG Foot Complete Left  Result Date: 02/28/2020 CLINICAL DATA:  Open wound on LEFT fifth metatarsal EXAM: LEFT FOOT - COMPLETE 3+ VIEW COMPARISON:  October 25, 2015. FINDINGS: No acute fracture or dislocation. There is an ulcer in the lateral aspect adjacent to the fifth MTP. No definitive adjacent cortical erosion. There is soft tissue air extending into the fourth and fifth digit and proximally to the level of the fourth mid  metatarsal shaft. Curvilinear radiopaque density seen on oblique images likely reflecting overlying bandage. IMPRESSION: 1. Soft tissue air extending from lateral foot ulcer into the fourth and fifth digits and proximally to the level of the fourth mid metatarsal shaft. This may be due to the open wound. Please note that necrotizing fasciitis is a clinical diagnosis. 2. No definitive adjacent cortical erosion to suggest osteomyelitis. If persistent concern, consider dedicated MRI. Electronically Signed   By: Valentino Saxon MD   On: 02/28/2020 13:11        Scheduled Meds: . heparin  5,000 Units Subcutaneous Q8H  . insulin aspart  0-5 Units Subcutaneous QHS  . insulin aspart  0-9 Units Subcutaneous  TID WC  . metroNIDAZOLE  500 mg Oral Q8H  . vancomycin variable dose per unstable renal function (pharmacist dosing)   Does not apply See admin instructions   Continuous Infusions: . ceFEPime (MAXIPIME) IV 2 g (02/29/20 1019)  . lactated ringers 100 mL/hr at 02/29/20 1005     LOS: 1 day    Time spent: 53mn    PDomenic Polite MD [facility] 02/29/2020, 11:09 AM

## 2020-02-29 NOTE — Progress Notes (Signed)
VASCULAR LAB    ABIs have been performed.  See CV proc for preliminary results.   Avaneesh Pepitone, RVT 02/29/2020, 2:10 PM

## 2020-02-29 NOTE — Progress Notes (Signed)
PHARMACY - PHYSICIAN COMMUNICATION CRITICAL VALUE ALERT - BLOOD CULTURE IDENTIFICATION (BCID)  Ronnie Shaw is an 39 y.o. male who presented to [facility] on 02/28/2020  Assessment:  39 y.o. male with history of prior R-TMA who presented on 02/28/2020 with L-DFI/abscess, ortho now planning toe amputations. Pharmacy has been consulted for Cefepime and to transition Vancomycin to Daptomycin with AKI.  BCx now growing strep species in 1/4 bottles, no resistance detected.  Name of physician (or Provider) Contacted: Jomarie Longs, P  Current antibiotics: cefepime/daptomycin  Changes to prescribed antibiotics recommended:  Continue daptomycin, change cefepime to ceftriaxone  Results for orders placed or performed during the hospital encounter of 02/28/20  Blood Culture ID Panel (Reflexed) (Collected: 02/28/2020 11:40 AM)  Result Value Ref Range   Enterococcus faecalis NOT DETECTED NOT DETECTED   Enterococcus Faecium NOT DETECTED NOT DETECTED   Listeria monocytogenes NOT DETECTED NOT DETECTED   Staphylococcus species NOT DETECTED NOT DETECTED   Staphylococcus aureus (BCID) NOT DETECTED NOT DETECTED   Staphylococcus epidermidis NOT DETECTED NOT DETECTED   Staphylococcus lugdunensis NOT DETECTED NOT DETECTED   Streptococcus species DETECTED (A) NOT DETECTED   Streptococcus agalactiae NOT DETECTED NOT DETECTED   Streptococcus pneumoniae NOT DETECTED NOT DETECTED   Streptococcus pyogenes NOT DETECTED NOT DETECTED   A.calcoaceticus-baumannii NOT DETECTED NOT DETECTED   Bacteroides fragilis NOT DETECTED NOT DETECTED   Enterobacterales NOT DETECTED NOT DETECTED   Enterobacter cloacae complex NOT DETECTED NOT DETECTED   Escherichia coli NOT DETECTED NOT DETECTED   Klebsiella aerogenes NOT DETECTED NOT DETECTED   Klebsiella oxytoca NOT DETECTED NOT DETECTED   Klebsiella pneumoniae NOT DETECTED NOT DETECTED   Proteus species NOT DETECTED NOT DETECTED   Salmonella species NOT DETECTED NOT DETECTED    Serratia marcescens NOT DETECTED NOT DETECTED   Haemophilus influenzae NOT DETECTED NOT DETECTED   Neisseria meningitidis NOT DETECTED NOT DETECTED   Pseudomonas aeruginosa NOT DETECTED NOT DETECTED   Stenotrophomonas maltophilia NOT DETECTED NOT DETECTED   Candida albicans NOT DETECTED NOT DETECTED   Candida auris NOT DETECTED NOT DETECTED   Candida glabrata NOT DETECTED NOT DETECTED   Candida krusei NOT DETECTED NOT DETECTED   Candida parapsilosis NOT DETECTED NOT DETECTED   Candida tropicalis NOT DETECTED NOT DETECTED   Cryptococcus neoformans/gattii NOT DETECTED NOT DETECTED     Leia Alf, PharmD, BCPS Please check AMION for all [facility] contact numbers Clinical Pharmacist 02/29/2020 4:25 PM

## 2020-02-29 NOTE — Consult Note (Addendum)
WOC Nurse Consult Note: Patient receiving care in [facility] [phone]. Reason for Consult: Lower extremity wound Wound type: Infected left foot diabetic wound with necrosis and soft tissue gas Pressure Injury POA: Yes/No/NA Measurement: see photo Wound bed: necrotic Drainage (amount, consistency, odor) worsening odor per H&P physician note; also, that the ED physician consulted ortho for this patient. Periwound: peeling and discolored Dressing procedure/placement/frequency: The DG Foot result from yesterday revealed: "IMPRESSION: 1. Soft tissue air extending from lateral foot ulcer into the fourth and fifth digits and proximally to the level of the fourth mid metatarsal shaft. This may be due to the open wound. Please note that necrotizing fasciitis is a clinical diagnosis." Thank you for the consult. WOC nurse will not follow at this time.  Please re-consult the WOC team if needed.  Helmut Muster, RN, MSN, CWOCN, CNS-BC, pager [phone]

## 2020-02-29 NOTE — Progress Notes (Signed)
Pharmacy Antibiotic Note  Ronnie Shaw is a 39 y.o. male with history of prior R-TMA who presented on 02/28/2020 with L-DFI/abscess, ortho now planning toe amputations. Pharmacy has been consulted for Cefepime and to transition Vancomycin to Daptomycin with AKI.  The patient is noted to be in AKI (BL 1.2-1.6, admit 3.58) - SCr down to 3.18 << 3.58, nCrCl~30 ml/min. The patient is also noted to be obese - will utilize an adjusted body weight for Daptomycin dosing and will schedule daily for now with improvement in SCr however will monitor SCr trends closely. Dapto approved by ID ([place])  Plan: - Start Daptomycin 650 mg (~6 mg/kg AdjBW) every 24 hours - first dose this evening at 2000 - CK x 1 today followed by weekly on Mondays while on Daptomycin - Continue Cefepime 2g IV every 12 hours - Continue Flagyl 500 mg po every 8 hours - Will continue to follow renal function, culture results, LOT, and antibiotic de-escalation plans   Height: 6\' 1"  (185.4 cm) Weight: (!) 151.3 kg (333 lb 9.6 oz) IBW/kg (Calculated) : 79.9  Temp (24hrs), Avg:100.1 F (37.8 C), Min:98.6 F (37 C), Max:101.8 F (38.8 C)  Recent Labs  Lab 02/28/20 1140 02/28/20 1540 02/29/20 0714  WBC 16.2*  --  15.3*  CREATININE 3.58*  --  3.18*  LATICACIDVEN 1.5 1.6  --     Estimated Creatinine Clearance: 48.8 mL/min (A) (by C-G formula based on SCr of 3.18 mg/dL (H)).    Allergies  Allergen Reactions  . Prednisone Other (See Comments)    "makes me go blind", "that's how I got cataracts".    Antimicrobials this admission: Vanc 11/14>> 11/15 CTX x 1 11/14 Flagyl po 11/14>> Cefepime 11/14>> Dapto 11/15 >>   Dose adjustments this admission: N/A  Microbiology results: 11/14 Fluvid >> neg 11/14 BCx >>  Thank you for allowing pharmacy to be a part of this patient's care.  12/14, PharmD, BCPS Clinical Pharmacist Clinical phone for 02/29/2020: 03/02/2020 02/29/2020 11:30 AM   **Pharmacist phone  directory can now be found on [email or web address] ([email or web address]).  Listed under [facility].

## 2020-03-01 LAB — CBC
HCT: 24.9 % — ABNORMAL LOW (ref 39.0–52.0)
Hemoglobin: 7.9 g/dL — ABNORMAL LOW (ref 13.0–17.0)
MCH: 27.1 pg (ref 26.0–34.0)
MCHC: 31.7 g/dL (ref 30.0–36.0)
MCV: 85.6 fL (ref 80.0–100.0)
Platelets: 268 10*3/uL (ref 150–400)
RBC: 2.91 MIL/uL — ABNORMAL LOW (ref 4.22–5.81)
RDW: 15.3 % (ref 11.5–15.5)
WBC: 14.5 10*3/uL — ABNORMAL HIGH (ref 4.0–10.5)
nRBC: 0 % (ref 0.0–0.2)

## 2020-03-01 LAB — GLUCOSE, CAPILLARY
Glucose-Capillary: 118 mg/dL — ABNORMAL HIGH (ref 70–99)
Glucose-Capillary: 117 mg/dL — ABNORMAL HIGH (ref 70–99)
Glucose-Capillary: 112 mg/dL — ABNORMAL HIGH (ref 70–99)
Glucose-Capillary: 122 mg/dL — ABNORMAL HIGH (ref 70–99)

## 2020-03-01 LAB — BASIC METABOLIC PANEL
Anion gap: 11 (ref 5–15)
BUN: 41 mg/dL — ABNORMAL HIGH (ref 6–20)
CO2: 19 mmol/L — ABNORMAL LOW (ref 22–32)
Calcium: 8.1 mg/dL — ABNORMAL LOW (ref 8.9–10.3)
Chloride: 100 mmol/L (ref 98–111)
Creatinine, Ser: 2.7 mg/dL — ABNORMAL HIGH (ref 0.61–1.24)
GFR, Estimated: 30 mL/min — ABNORMAL LOW (ref >60)
Glucose, Bld: 159 mg/dL — ABNORMAL HIGH (ref 70–99)
Potassium: 4.4 mmol/L (ref 3.5–5.1)
Sodium: 130 mmol/L — ABNORMAL LOW (ref 135–145)

## 2020-03-01 NOTE — TOC Initial Note (Signed)
Transition of Care [facility]) - Initial/Assessment Note    Patient Details  Name: Ronnie Shaw MRN: [id number] Date of Birth: May 24, 1980  Transition of Care [facility]) CM/SW Contact:    Bess Kinds, RN Phone Number: [phone] 03/01/2020, 12:18 PM  Clinical Narrative:                  Spoke with patient and mom at bedside. Patient consented for NCM to speak with mom. PTA home with mom. He has crutches, walker, wheelchair, and knee scooter at home. No home health services. Patient provides his own self care.   Discussed established previously with [facility]. Hospital follow up scheduled for 12/31 9:30am at [facility].   Has received MATCH in the past, 09/2017, so eligible at this time if medication assistance needed again.   Provided NCM contact information.   TOC following for transition needs.   Expected Discharge Plan: Home/Self Care Barriers to Discharge: Continued Medical Work up   Patient Goals and CMS Choice Patient states their goals for this hospitalization and ongoing recovery are:: return home CMS Medicare.gov Compare Post Acute Care list provided to:: Patient    Expected Discharge Plan and Services Expected Discharge Plan: Home/Self Care In-house Referral: Financial Counselor Discharge Planning Services: CM Consult   Living arrangements for the past 2 months: Single Family Home                                      Prior Living Arrangements/Services Living arrangements for the past 2 months: Single Family Home Lives with:: Self, Parents Patient language and need for interpreter reviewed:: Yes Do you feel safe going back to the place where you live?: Yes      Need for Family Participation in Patient Care: Yes (Comment) Care giver support system in place?: Yes (comment) Current home services: DME (crutches; walker; wheelchair; knee scooter) Criminal Activity/Legal Involvement Pertinent to Current Situation/Hospitalization: No - Comment as needed  Activities of  Daily Living      Permission Sought/Granted Permission sought to share information with : Family Supports Permission granted to share information with : Yes, Verbal Permission Granted        Permission granted to share info w Relationship: mom     Emotional Assessment Appearance:: Appears stated age Attitude/Demeanor/Rapport: Engaged Affect (typically observed): Accepting Orientation: : Oriented to Self, Oriented to  Time, Oriented to Place, Oriented to Situation Alcohol / Substance Use: Not Applicable Psych Involvement: No (comment)  Admission diagnosis:  Cough [R05.9] Pain [R52] Sepsis (HCC) [A41.9] 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 Severe sepsis with acute organ dysfunction (HCC) 02/28/2020  . Acute kidney failure (HCC) 02/28/2020  . Diabetic ulcer of left foot (HCC) 02/28/2020  . Infection of left foot 02/28/2020  . Skin ulceration, limited to breakdown of skin (HCC) 11/27/2017  . 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 Diabetic polyneuropathy associated with type 2 diabetes mellitus (HCC)   . Osteomyelitis of right foot (HCC) 09/16/2017  . Diabetes mellitus type 2 in obese (HCC) 09/16/2017  . Normochromic normocytic anemia 09/16/2017  . Hyponatremia 09/16/2017  . Subacute osteomyelitis, right ankle and foot (HCC) 09/16/2017  . Osteomyelitis of  foot, right, acute (HCC) 09/16/2017   PCP:  Claiborne Rigg, NP Pharmacy:   [facility] [address] [place], [place] - [address] [place] [address] Phone: [phone] Fax: [phone]     Social Determinants of Health (SDOH) Interventions    Readmission Risk Interventions No flowsheet data  found.

## 2020-03-01 NOTE — Plan of Care (Signed)
  Problem: Education: Goal: Knowledge of General Education information will improve Description Including pain rating scale, medication(s)/side effects and non-pharmacologic comfort measures Outcome: Progressing   

## 2020-03-01 NOTE — Progress Notes (Signed)
PROGRESS NOTE    Ronnie Shaw  EGB:[id number] DOB: January 28, 1981 DOA: 02/28/2020 PCP: Gildardo Pounds, NP  Brief Narrative: Mr. Swiderski is a 39 year old male with history of obesity, type 2 diabetes mellitus, diabetic foot ulcers with right foot transmetatarsal amputation in 2020 presented to the ED with fevers chills nausea vomiting cough and worsening pain in his left foot -In the emergency room he was febrile, met sepsis criteria, work-up was concerning for necrotic wound on his left foot and acute kidney injury creatinine of 3.5 -Admitted with sepsis, necrotic diabetic foot ulcer and acute kidney injury  Assessment & Plan:   Sepsis poa Necrotic diabetic foot ulcer Group C strep bacteremia -Cut down IV fluids today, continue cefepime and daptomycin, vancomycin discontinued due to severe AKI -Blood cultures with group C strep -Orthopedics Dr. Sharol Given consulting, recommended plan for foot salvage intervention with fourth and fifth ray amputation on Wednesday -Patient is upset about recommendations for toe amputations, demands alternative management, request second opinion by orthopedics -ABIs normal  Acute kidney injury -Creatinine 3.5 on admission, baseline is 1.6 from 7/21  -Suspected to be ATN from sepsis, creatinine improving down to 2.7 today -No NSAID use, no offending meds noted,  urinalysis with 100 proteinuria, hyaline casts present -Cut down IV fluids, vancomycin discontinued yesterday -Monitor kidney function and urine output closely  Obesity Type 2 diabetes mellitus -BMI is 44, weight loss encouraged -Hemoglobin A1c is 6.4, continue sliding scale insulin  Abnormal chest x-ray -No symptoms of pneumonia, Covid negative -Add incentive spirometry, recheck x-ray in 2 to 3 days or if develops symptoms  Social -Does not have a PCP, uninsured, requested case management for input  DVT prophylaxis: Heparin subcutaneous Code Status: Full code Family Communication: Discussed  with patient in detail, no family at bedside Disposition Plan:  Status is: Inpatient  Remains inpatient appropriate because:Inpatient level of care appropriate due to severity of illness   Dispo: The patient is from: Home              Anticipated d/c is to: Home              Anticipated d/c date is: > 3 days              Patient currently is not medically stable to d/c.  Consultants:   Orthopedics Dr. Sharol Given   Procedures:   Antimicrobials:    Subjective: -Feels poorly, tired, did not sleep well last night  Objective: Vitals:   02/29/20 1928 02/29/20 2032 03/01/20 0634 03/01/20 0918  BP: 133/69 (!) 148/70 138/82 134/74  Pulse: (!) 117 72 (!) 101 99  Resp: _0 Temp: 98.9 F (37.2 C) 98.7 F (37.1 C) 99.3 F (37.4 C) 98.8 F (37.1 C)  TempSrc: Oral Oral Oral Oral  SpO2: 96%  97% 98%  Weight:      Height:        Intake/Output Summary (Last 24 hours) at 03/01/2020 1322 Last data filed at 03/01/2020 0900 Gross per 24 hour  Intake 3284.67 ml  Output 0 ml  Net 3284.67 ml   Filed Weights   02/28/20 1120  Weight: (!) 151.3 kg    Examination:  General exam: Obese male sitting up in bed, AAOx3, no distress CVS: S1-S2, regular rate rhythm Lungs: Distant breath sounds to bases, otherwise clear Abdomen: Soft, obese, nontender, bowel sounds present Extremities: Right foot with transmetatarsal amputation and discoloration of the margins, left foot with large black necrotic ulcer, foul-smelling Psychiatry: Angry  Data  Reviewed:   CBC: Recent Labs  Lab 02/28/20 1140 02/29/20 0714 03/01/20 0341  WBC 16.2* 15.3* 14.5*  NEUTROABS 14.8*  --   --   HGB 9.3* 7.9* 7.9*  HCT 28.8* 24.7* 24.9*  MCV 84.7 85.5 85.6  PLT 345 278 262   Basic Metabolic Panel: Recent Labs  Lab 02/28/20 1140 02/29/20 0714 03/01/20 0341  NA 126* 130* 130*  K 4.6 4.1 4.4  CL 94* 101 100  CO2 21* 20* 19*  GLUCOSE 228* 130* 159*  BUN 33* 35* 41*  CREATININE 3.58* 3.18* 2.70*    CALCIUM 8.3* 8.1* 8.1*   GFR: Estimated Creatinine Clearance: 57.5 mL/min (A) (by C-G formula based on SCr of 2.7 mg/dL (H)). Liver Function Tests: Recent Labs  Lab 02/28/20 1140 02/29/20 0714  AST 32 39  ALT 29 33  ALKPHOS 89 84  BILITOT 0.6 0.5  PROT 8.3* 7.0  ALBUMIN 2.7* 2.0*   No results for input(s): LIPASE, AMYLASE in the last 168 hours. No results for input(s): AMMONIA in the last 168 hours. Coagulation Profile: Recent Labs  Lab 02/28/20 1140 02/29/20 0714  INR 1.3* 1.4*   Cardiac Enzymes: Recent Labs  Lab 02/29/20 1206  CKTOTAL 979*   BNP (last 3 results) No results for input(s): PROBNP in the last 8760 hours. HbA1C: Recent Labs    02/29/20 0714  HGBA1C 6.4*   CBG: Recent Labs  Lab 02/29/20 1139 02/29/20 1640 02/29/20 2322 03/01/20 0642 03/01/20 1215  GLUCAP 101* 87 110* 118* 117*   Lipid Profile: No results for input(s): CHOL, HDL, LDLCALC, TRIG, CHOLHDL, LDLDIRECT in the last 72 hours. Thyroid Function Tests: No results for input(s): TSH, T4TOTAL, FREET4, T3FREE, THYROIDAB in the last 72 hours. Anemia Panel: No results for input(s): VITAMINB12, FOLATE, FERRITIN, TIBC, IRON, RETICCTPCT in the last 72 hours. Urine analysis:    Component Value Date/Time   COLORURINE YELLOW 02/29/2020 0447   APPEARANCEUR CLOUDY (A) 02/29/2020 0447   LABSPEC 1.016 02/29/2020 0447   PHURINE 5.0 02/29/2020 0447   GLUCOSEU NEGATIVE 02/29/2020 0447   HGBUR MODERATE (A) 02/29/2020 0447   BILIRUBINUR NEGATIVE 02/29/2020 [place] 02/29/2020 0447   PROTEINUR 100 (A) 02/29/2020 0447   UROBILINOGEN 1.0 12/18/2014 0533   NITRITE NEGATIVE 02/29/2020 0447   LEUKOCYTESUR NEGATIVE 02/29/2020 0447   Sepsis Labs: _0 (procalcitonin:4,lacticidven:4)  ) Recent Results (from the past 240 hour(s))  Culture, blood (Routine x 2)     Status: Abnormal (Preliminary result)   Collection Time: 02/28/20 11:40 AM   Specimen: BLOOD  Result Value Ref Range  Status   Specimen Description   Final    BLOOD RIGHT ANTECUBITAL Performed at [facility], [place] [address] [place], [place] [address]    Special Requests   Final    BOTTLES DRAWN AEROBIC AND ANAEROBIC Blood Culture adequate volume Performed at [facility], [address] [place], [place] [address]    Culture  Setup Time   Final    GRAM POSITIVE COCCI IN CHAINS ANAEROBIC BOTTLE ONLY CRITICAL RESULT CALLED TO, READ BACK BY AND VERIFIED WITH: Earmon Phoenix [phone] FCP Performed at [facility], [place] [address] [place], [place] [address]    Culture STREPTOCOCCUS GROUP C (A)  Final   Report Status PENDING  Incomplete  Blood Culture ID Panel (Reflexed)     Status: Abnormal   Collection Time: 02/28/20 11:40 AM  Result Value Ref Range Status   Enterococcus faecalis NOT DETECTED  NOT DETECTED Final   Enterococcus Faecium NOT DETECTED NOT DETECTED Final   Listeria monocytogenes NOT DETECTED NOT DETECTED Final   Staphylococcus species NOT DETECTED NOT DETECTED Final   Staphylococcus aureus (BCID) NOT DETECTED NOT DETECTED Final   Staphylococcus epidermidis NOT DETECTED NOT DETECTED Final   Staphylococcus lugdunensis NOT DETECTED NOT DETECTED Final   Streptococcus species DETECTED (A) NOT DETECTED Final    Comment: Not Enterococcus species, Streptococcus agalactiae, Streptococcus pyogenes, or Streptococcus pneumoniae. CRITICAL RESULT CALLED TO, READ BACK BY AND VERIFIED WITH: PHARMD H. VONDOHLEN [phone] FCP    Streptococcus agalactiae NOT DETECTED NOT DETECTED Final   Streptococcus pneumoniae NOT DETECTED NOT DETECTED Final   Streptococcus pyogenes NOT DETECTED NOT DETECTED Final   A.calcoaceticus-baumannii NOT DETECTED NOT DETECTED Final   Bacteroides fragilis NOT DETECTED NOT DETECTED Final   Enterobacterales NOT DETECTED NOT DETECTED Final   Enterobacter cloacae complex NOT DETECTED NOT DETECTED Final   Escherichia coli NOT DETECTED NOT DETECTED  Final   Klebsiella aerogenes NOT DETECTED NOT DETECTED Final   Klebsiella oxytoca NOT DETECTED NOT DETECTED Final   Klebsiella pneumoniae NOT DETECTED NOT DETECTED Final   Proteus species NOT DETECTED NOT DETECTED Final   Salmonella species NOT DETECTED NOT DETECTED Final   Serratia marcescens NOT DETECTED NOT DETECTED Final   Haemophilus influenzae NOT DETECTED NOT DETECTED Final   Neisseria meningitidis NOT DETECTED NOT DETECTED Final   Pseudomonas aeruginosa NOT DETECTED NOT DETECTED Final   Stenotrophomonas maltophilia NOT DETECTED NOT DETECTED Final   Candida albicans NOT DETECTED NOT DETECTED Final   Candida auris NOT DETECTED NOT DETECTED Final   Candida glabrata NOT DETECTED NOT DETECTED Final   Candida krusei NOT DETECTED NOT DETECTED Final   Candida parapsilosis NOT DETECTED NOT DETECTED Final   Candida tropicalis NOT DETECTED NOT DETECTED Final   Cryptococcus neoformans/gattii NOT DETECTED NOT DETECTED Final    Comment: Performed at [facility], [address] [place], [place] [address]  Culture, blood (Routine x 2)     Status: None (Preliminary result)   Collection Time: 02/28/20 12:20 PM   Specimen: BLOOD LEFT HAND  Result Value Ref Range Status   Specimen Description   Final    BLOOD LEFT HAND Performed at [facility], [address] [place], [place] [address]    Special Requests   Final    BOTTLES DRAWN AEROBIC AND ANAEROBIC Blood Culture adequate volume Performed at [facility], [address] [place], [place] [address]    Culture   Final    NO GROWTH < 24 HOURS Performed at [facility], [place] [address] [place], [place] [address]    Report Status PENDING  Incomplete  Respiratory Panel by RT PCR (Flu A&B, Covid) - Nasopharyngeal Swab     Status: None   Collection Time: 02/28/20 12:29 PM   Specimen: Nasopharyngeal Swab  Result Value Ref Range Status   SARS Coronavirus 2 by RT PCR NEGATIVE NEGATIVE Final    Comment:  (NOTE) SARS-CoV-2 target nucleic acids are NOT DETECTED.  The SARS-CoV-2 RNA is generally detectable in upper respiratoy specimens during the acute phase of infection. The lowest concentration of SARS-CoV-2 viral copies this assay can detect is 131 copies/mL. A negative result does not preclude SARS-Cov-2 infection and should not be used as the sole basis for treatment or other patient management decisions. A negative result may occur with  improper specimen collection/handling, submission of specimen other than  nasopharyngeal swab, presence of viral mutation(s) within the areas targeted by this assay, and inadequate number of viral copies (<131 copies/mL). A negative result must be combined with clinical observations, patient history, and epidemiological information. The expected result is Negative.  Fact Sheet for Patients:  PinkCheek.be  Fact Sheet for Healthcare Providers:  GravelBags.it  This test is no t yet approved or cleared by the [place] FDA and  has been authorized for detection and/or diagnosis of SARS-CoV-2 by FDA under an Emergency Use Authorization (EUA). This EUA will remain  in effect (meaning this test can be used) for the duration of the COVID-19 declaration under Section 564(b)(1) of the Act, 21 U.S.C. section 360bbb-3(b)(1), unless the authorization is terminated or revoked sooner.     Influenza A by PCR NEGATIVE NEGATIVE Final   Influenza B by PCR NEGATIVE NEGATIVE Final    Comment: (NOTE) The Xpert Xpress SARS-CoV-2/FLU/RSV assay is intended as an aid in  the diagnosis of influenza from Nasopharyngeal swab specimens and  should not be used as a sole basis for treatment. Nasal washings and  aspirates are unacceptable for Xpert Xpress SARS-CoV-2/FLU/RSV  testing.  Fact Sheet for Patients: PinkCheek.be  Fact Sheet for Healthcare  Providers: GravelBags.it  This test is not yet approved or cleared by the [place] FDA and  has been authorized for detection and/or diagnosis of SARS-CoV-2 by  FDA under an Emergency Use Authorization (EUA). This EUA will remain  in effect (meaning this test can be used) for the duration of the  Covid-19 declaration under Section 564(b)(1) of the Act, 21  U.S.C. section 360bbb-3(b)(1), unless the authorization is  terminated or revoked. Performed at [facility], [address] [place], [place] [address]     Radiology Studies: VAS [place] ABI WITH/WO TBI  Result Date: 02/29/2020 LOWER EXTREMITY DOPPLER STUDY Indications: Ulceration, and gangrene. High Risk Factors: Diabetes. Other Factors: Right mid foot amputation.  Limitations: Today's exam was limited due to IV in right AC, bandages on both              feet, edema, and tachycardia secondary to sepsis. Comparison Study: No prior study Performing Technologist: Sharion Dove RVS  Examination Guidelines: A complete evaluation includes at minimum, Doppler waveform signals and systolic blood pressure reading at the level of bilateral brachial, anterior tibial, and posterior tibial arteries, when vessel segments are accessible. Bilateral testing is considered an integral part of a complete examination. Photoelectric Plethysmograph (PPG) waveforms and toe systolic pressure readings are included as required and additional duplex testing as needed. Limited examinations for reoccurring indications may be performed as noted.  ABI Findings: +---------+------------------+-----+-----------+----------+ Right    Rt Pressure (mmHg)IndexWaveform   Comment    +---------+------------------+-----+-----------+----------+ Brachial                        multiphasicIV in AC   +---------+------------------+-----+-----------+----------+ PTA      155               1.18 multiphasic            +---------+------------------+-----+-----------+----------+ DP       142               1.08 multiphasic           +---------+------------------+-----+-----------+----------+ Great Toe  amputation +---------+------------------+-----+-----------+----------+ +---------+------------------+-----+-----------+-------+ Left     Lt Pressure (mmHg)IndexWaveform   Comment +---------+------------------+-----+-----------+-------+ Brachial 131                    multiphasic        +---------+------------------+-----+-----------+-------+ PTA      142               1.08 multiphasic        +---------+------------------+-----+-----------+-------+ DP       140               1.07 multiphasic        +---------+------------------+-----+-----------+-------+ Great Toe93                0.71                    +---------+------------------+-----+-----------+-------+ +-------+-----------+-----------+------------+------------+ ABI/TBIToday's ABIToday's TBIPrevious ABIPrevious TBI +-------+-----------+-----------+------------+------------+ Right  1.18       amp                                 +-------+-----------+-----------+------------+------------+ Left   1.08       0.71                                +-------+-----------+-----------+------------+------------+  Summary: Right: Resting right ankle-brachial index is within normal range. No evidence of significant right lower extremity arterial disease. Left: Resting left ankle-brachial index is within normal range. No evidence of significant left lower extremity arterial disease. The left toe-brachial index is normal.  *See table(s) above for measurements and observations.  Electronically signed by Servando Snare MD on 02/29/2020 at 4:30:23 PM.    Final    Scheduled Meds: . heparin  5,000 Units Subcutaneous Q8H  . insulin aspart  0-5 Units Subcutaneous QHS  . insulin aspart  0-9 Units Subcutaneous  TID WC  . metroNIDAZOLE  500 mg Oral Q8H  . multivitamin with minerals  1 tablet Oral Daily  . Ensure Max Protein  11 oz Oral TID   Continuous Infusions: . cefTRIAXone (ROCEPHIN)  IV 2 g (02/29/20 2327)  . DAPTOmycin (CUBICIN)  IV 650 mg (02/29/20 4818)     LOS: 2 days    Time spent: 70mn  PDomenic Polite MD [facility] 03/01/2020, 1:22 PM

## 2020-03-01 NOTE — Progress Notes (Signed)
MD made this RN aware that she removed the dressing on the left foot and that it needed to be changed.  Cleansed wound with normal saline, pat dry with gauze, covered with abd pad and wrapped with Kerlex.  Pt tolerated procedure well.
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
## 2020-03-01 NOTE — Progress Notes (Signed)
Patient ID: Ronnie Shaw, male   DOB: 1980/08/06, 39 y.o.   MRN: [id number] I called to the patient's room to speak to the patient and his mother.  I have recommended excisional debridement of the abscess and the infected bone to excise the infected tissue and give his body a better chance for foot salvage.  Patient's mother feels that IV antibiotics will do a better job to resolve this infection than amputating necrotic tissue at this time.  I discussed the risk with relying on IV antibiotics is a risk that the infection may spread and potential risk of loss of his foot.  I encouraged them to obtain a second opinion.  I will follow-up tomorrow.

## 2020-03-02 ENCOUNTER — Inpatient Hospital Stay (HOSPITAL_COMMUNITY): Payer: Self-pay

## 2020-03-02 ENCOUNTER — Encounter (HOSPITAL_COMMUNITY)
Admission: EM | Disposition: A | Discharge status: Rehabilitation Facility | Payer: Self-pay | Source: Home / Self Care | Attending: Internal Medicine

## 2020-03-02 ENCOUNTER — Other Ambulatory Visit: Payer: Self-pay | Admitting: Physician Assistant

## 2020-03-02 ENCOUNTER — Inpatient Hospital Stay (HOSPITAL_COMMUNITY): Payer: Self-pay | Admitting: Certified Registered"

## 2020-03-02 ENCOUNTER — Encounter (HOSPITAL_COMMUNITY): Payer: Self-pay | Admitting: Internal Medicine

## 2020-03-02 ENCOUNTER — Telehealth: Payer: Self-pay | Admitting: Orthopedic Surgery

## 2020-03-02 DIAGNOSIS — L97528 Non-pressure chronic ulcer of other part of left foot with other specified severity: Secondary | ICD-10-CM

## 2020-03-02 DIAGNOSIS — Z89421 Acquired absence of other right toe(s): Secondary | ICD-10-CM

## 2020-03-02 DIAGNOSIS — A419 Sepsis, unspecified organism: Secondary | ICD-10-CM

## 2020-03-02 DIAGNOSIS — E1165 Type 2 diabetes mellitus with hyperglycemia: Secondary | ICD-10-CM

## 2020-03-02 DIAGNOSIS — M86272 Subacute osteomyelitis, left ankle and foot: Secondary | ICD-10-CM

## 2020-03-02 DIAGNOSIS — I96 Gangrene, not elsewhere classified: Secondary | ICD-10-CM

## 2020-03-02 DIAGNOSIS — M726 Necrotizing fasciitis: Secondary | ICD-10-CM

## 2020-03-02 HISTORY — PX: AMPUTATION: SHX166

## 2020-03-02 LAB — CBC
HCT: 23.2 % — ABNORMAL LOW (ref 39.0–52.0)
Hemoglobin: 7.5 g/dL — ABNORMAL LOW (ref 13.0–17.0)
MCH: 27.3 pg (ref 26.0–34.0)
MCHC: 32.3 g/dL (ref 30.0–36.0)
MCV: 84.4 fL (ref 80.0–100.0)
Platelets: 289 10*3/uL (ref 150–400)
RBC: 2.75 MIL/uL — ABNORMAL LOW (ref 4.22–5.81)
RDW: 15.6 % — ABNORMAL HIGH (ref 11.5–15.5)
WBC: 15.9 10*3/uL — ABNORMAL HIGH (ref 4.0–10.5)
nRBC: 0 % (ref 0.0–0.2)

## 2020-03-02 LAB — CULTURE, BLOOD (ROUTINE X 2): Special Requests: ADEQUATE

## 2020-03-02 LAB — GLUCOSE, CAPILLARY
Glucose-Capillary: 129 mg/dL — ABNORMAL HIGH (ref 70–99)
Glucose-Capillary: 115 mg/dL — ABNORMAL HIGH (ref 70–99)
Glucose-Capillary: 117 mg/dL — ABNORMAL HIGH (ref 70–99)
Glucose-Capillary: 122 mg/dL — ABNORMAL HIGH (ref 70–99)
Glucose-Capillary: 213 mg/dL — ABNORMAL HIGH (ref 70–99)

## 2020-03-02 LAB — BASIC METABOLIC PANEL
Anion gap: 11 (ref 5–15)
BUN: 44 mg/dL — ABNORMAL HIGH (ref 6–20)
CO2: 18 mmol/L — ABNORMAL LOW (ref 22–32)
Calcium: 8.1 mg/dL — ABNORMAL LOW (ref 8.9–10.3)
Chloride: 101 mmol/L (ref 98–111)
Creatinine, Ser: 2.35 mg/dL — ABNORMAL HIGH (ref 0.61–1.24)
GFR, Estimated: 36 mL/min — ABNORMAL LOW (ref >60)
Glucose, Bld: 150 mg/dL — ABNORMAL HIGH (ref 70–99)
Potassium: 4.2 mmol/L (ref 3.5–5.1)
Sodium: 130 mmol/L — ABNORMAL LOW (ref 135–145)

## 2020-03-02 LAB — ABO/RH: ABO/RH(D): B POS

## 2020-03-02 LAB — SURGICAL PCR SCREEN
MRSA, PCR: NEGATIVE
Staphylococcus aureus: NEGATIVE

## 2020-03-02 LAB — HEMOGLOBIN AND HEMATOCRIT, BLOOD
HCT: 24.9 % — ABNORMAL LOW (ref 39.0–52.0)
Hemoglobin: 8 g/dL — ABNORMAL LOW (ref 13.0–17.0)

## 2020-03-02 LAB — PREPARE RBC (CROSSMATCH)

## 2020-03-02 SURGERY — AMPUTATION, FOOT, RAY
Anesthesia: General | Site: Foot | Laterality: Left

## 2020-03-02 MED ORDER — DOCUSATE SODIUM 100 MG PO CAPS
100.0000 mg | ORAL_CAPSULE | Freq: Two times a day (BID) | ORAL | Status: DC
  Administered 2020-03-02 – 2020-03-03 (×3): 100 mg via ORAL
  Filled 2020-03-02 (×4): qty 1

## 2020-03-02 MED ORDER — PROPOFOL 10 MG/ML IV BOLUS
INTRAVENOUS | Status: DC | PRN
  Administered 2020-03-02: 200 mg via INTRAVENOUS

## 2020-03-02 MED ORDER — LACTATED RINGERS IV SOLN: INTRAVENOUS | Status: DC | PRN

## 2020-03-02 MED ORDER — DEXAMETHASONE SODIUM PHOSPHATE 10 MG/ML IJ SOLN
INTRAMUSCULAR | Status: AC
  Filled 2020-03-02: qty 1

## 2020-03-02 MED ORDER — DEXAMETHASONE SODIUM PHOSPHATE 10 MG/ML IJ SOLN
INTRAMUSCULAR | Status: DC | PRN
  Administered 2020-03-02: 5 mg via INTRAVENOUS

## 2020-03-02 MED ORDER — ONDANSETRON HCL 4 MG/2ML IJ SOLN
INTRAMUSCULAR | Status: AC
  Filled 2020-03-02: qty 2

## 2020-03-02 MED ORDER — MIDAZOLAM HCL 2 MG/2ML IJ SOLN
INTRAMUSCULAR | Status: DC | PRN
  Administered 2020-03-02: 2 mg via INTRAVENOUS

## 2020-03-02 MED ORDER — METOCLOPRAMIDE HCL 5 MG PO TABS: 5.0000 mg | ORAL_TABLET | Freq: Three times a day (TID) | ORAL | Status: DC | PRN

## 2020-03-02 MED ORDER — FENTANYL CITRATE (PF) 250 MCG/5ML IJ SOLN
INTRAMUSCULAR | Status: AC
  Filled 2020-03-02: qty 5

## 2020-03-02 MED ORDER — MIDAZOLAM HCL 2 MG/2ML IJ SOLN
INTRAMUSCULAR | Status: AC
  Filled 2020-03-02: qty 2

## 2020-03-02 MED ORDER — DEXTROSE 5 % IV SOLN
INTRAVENOUS | Status: DC | PRN
  Administered 2020-03-02: 3 g via INTRAVENOUS

## 2020-03-02 MED ORDER — METOCLOPRAMIDE HCL 5 MG/ML IJ SOLN: 5.0000 mg | Freq: Three times a day (TID) | INTRAMUSCULAR | Status: DC | PRN

## 2020-03-02 MED ORDER — FENTANYL CITRATE (PF) 100 MCG/2ML IJ SOLN: 25.0000 ug | INTRAMUSCULAR | Status: DC | PRN

## 2020-03-02 MED ORDER — LACTATED RINGERS IV SOLN: INTRAVENOUS | Status: DC

## 2020-03-02 MED ORDER — SUCCINYLCHOLINE CHLORIDE 200 MG/10ML IV SOSY
PREFILLED_SYRINGE | INTRAVENOUS | Status: DC | PRN
  Administered 2020-03-02: 160 mg via INTRAVENOUS

## 2020-03-02 MED ORDER — PROPOFOL 10 MG/ML IV BOLUS
INTRAVENOUS | Status: AC
  Filled 2020-03-02: qty 20

## 2020-03-02 MED ORDER — LIDOCAINE 2% (20 MG/ML) 5 ML SYRINGE
INTRAMUSCULAR | Status: DC | PRN
  Administered 2020-03-02: 100 mg via INTRAVENOUS

## 2020-03-02 MED ORDER — ORAL CARE MOUTH RINSE: 15.0000 mL | Freq: Once | OROMUCOSAL | Status: AC

## 2020-03-02 MED ORDER — PROMETHAZINE HCL 25 MG/ML IJ SOLN: 6.2500 mg | INTRAMUSCULAR | Status: DC | PRN

## 2020-03-02 MED ORDER — HYDROMORPHONE HCL 1 MG/ML IJ SOLN
0.5000 mg | INTRAMUSCULAR | Status: DC | PRN
  Administered 2020-03-04 – 2020-03-10 (×3): 0.5 mg via INTRAVENOUS
  Filled 2020-03-02 (×3): qty 1

## 2020-03-02 MED ORDER — CHLORHEXIDINE GLUCONATE 0.12 % MT SOLN
OROMUCOSAL | Status: AC
  Administered 2020-03-02: 15 mL via OROMUCOSAL
  Filled 2020-03-02: qty 15

## 2020-03-02 MED ORDER — 0.9 % SODIUM CHLORIDE (POUR BTL) OPTIME
TOPICAL | Status: DC | PRN
  Administered 2020-03-02: 1000 mL

## 2020-03-02 MED ORDER — HEPARIN SODIUM (PORCINE) 5000 UNIT/ML IJ SOLN
5000.0000 [IU] | Freq: Three times a day (TID) | INTRAMUSCULAR | Status: DC
  Administered 2020-03-03 – 2020-03-05 (×6): 5000 [IU] via SUBCUTANEOUS
  Filled 2020-03-02 (×6): qty 1

## 2020-03-02 MED ORDER — SODIUM CHLORIDE 0.9 % IR SOLN
Status: DC | PRN
  Administered 2020-03-02: 250 mL

## 2020-03-02 MED ORDER — OXYCODONE HCL 5 MG PO TABS
5.0000 mg | ORAL_TABLET | ORAL | Status: DC | PRN
  Administered 2020-03-05 – 2020-03-06 (×3): 5 mg via ORAL
  Administered 2020-03-08: 10 mg via ORAL
  Filled 2020-03-02: qty 2
  Filled 2020-03-02 (×2): qty 1
  Filled 2020-03-02: qty 2
  Filled 2020-03-02: qty 1

## 2020-03-02 MED ORDER — FENTANYL CITRATE (PF) 250 MCG/5ML IJ SOLN
INTRAMUSCULAR | Status: DC | PRN
  Administered 2020-03-02: 50 ug via INTRAVENOUS
  Administered 2020-03-02: 100 ug via INTRAVENOUS
  Administered 2020-03-02 (×2): 50 ug via INTRAVENOUS

## 2020-03-02 MED ORDER — CHLORHEXIDINE GLUCONATE 0.12 % MT SOLN: 15.0000 mL | Freq: Once | OROMUCOSAL | Status: AC

## 2020-03-02 MED ORDER — SODIUM CHLORIDE 0.9 % IV SOLN: INTRAVENOUS | Status: DC

## 2020-03-02 MED ORDER — GUAIFENESIN-DM 100-10 MG/5ML PO SYRP: 5.0000 mL | ORAL_SOLUTION | ORAL | Status: DC | PRN

## 2020-03-02 MED ORDER — SODIUM CHLORIDE 0.9% IV SOLUTION: Freq: Once | INTRAVENOUS | Status: DC

## 2020-03-02 MED ORDER — SODIUM CHLORIDE 0.9 % IV SOLN: INTRAVENOUS | Status: DC | PRN

## 2020-03-02 MED ORDER — DEXTROSE 5 % IV SOLN
3.0000 g | INTRAVENOUS | Status: DC
  Filled 2020-03-02: qty 3000

## 2020-03-02 MED ORDER — LIDOCAINE 2% (20 MG/ML) 5 ML SYRINGE
INTRAMUSCULAR | Status: AC
  Filled 2020-03-02: qty 5

## 2020-03-02 MED ORDER — DEXMEDETOMIDINE (PRECEDEX) IN NS 20 MCG/5ML (4 MCG/ML) IV SYRINGE
PREFILLED_SYRINGE | INTRAVENOUS | Status: DC | PRN
  Administered 2020-03-02 (×2): 8 ug via INTRAVENOUS
  Administered 2020-03-02: 4 ug via INTRAVENOUS

## 2020-03-02 MED ORDER — ONDANSETRON HCL 4 MG/2ML IJ SOLN
INTRAMUSCULAR | Status: DC | PRN
  Administered 2020-03-02: 4 mg via INTRAVENOUS

## 2020-03-02 MED ORDER — SUCCINYLCHOLINE CHLORIDE 200 MG/10ML IV SOSY
PREFILLED_SYRINGE | INTRAVENOUS | Status: AC
  Filled 2020-03-02: qty 10

## 2020-03-02 MED ORDER — MEPERIDINE HCL 25 MG/ML IJ SOLN: 6.2500 mg | INTRAMUSCULAR | Status: DC | PRN

## 2020-03-02 MED ORDER — PANTOPRAZOLE SODIUM 40 MG IV SOLR
40.0000 mg | INTRAVENOUS | Status: DC
  Administered 2020-03-02: 40 mg via INTRAVENOUS
  Filled 2020-03-02: qty 40

## 2020-03-02 MED ORDER — CHLORHEXIDINE GLUCONATE 0.12 % MT SOLN
15.0000 mL | OROMUCOSAL | Status: DC
  Filled 2020-03-02: qty 15

## 2020-03-02 SURGICAL SUPPLY — 33 items
BLADE SAW SGTL MED 73X18.5 STR (BLADE) IMPLANT
BLADE SURG 21 STRL SS (BLADE) ×2 IMPLANT
BNDG COHESIVE 4X5 TAN STRL (GAUZE/BANDAGES/DRESSINGS) ×2 IMPLANT
BNDG GAUZE ELAST 4 BULKY (GAUZE/BANDAGES/DRESSINGS) ×2 IMPLANT
CANISTER WOUNDNEG PRESSURE 500 (CANNISTER) ×2 IMPLANT
CONT SPEC 4OZ CLIKSEAL STRL BL (MISCELLANEOUS) ×2 IMPLANT
COVER SURGICAL LIGHT HANDLE (MISCELLANEOUS) ×4 IMPLANT
COVER WAND RF STERILE (DRAPES) IMPLANT
DRAPE DERMATAC (DRAPES) ×4 IMPLANT
DRAPE U-SHAPE 47X51 STRL (DRAPES) ×4 IMPLANT
DRESSING VERAFLO CLEANSE CC (GAUZE/BANDAGES/DRESSINGS) ×1 IMPLANT
DRSG ADAPTIC 3X8 NADH LF (GAUZE/BANDAGES/DRESSINGS) ×2 IMPLANT
DRSG PAD ABDOMINAL 8X10 ST (GAUZE/BANDAGES/DRESSINGS) ×2 IMPLANT
DRSG VERAFLO CLEANSE CC (GAUZE/BANDAGES/DRESSINGS) ×2
DURAPREP 26ML APPLICATOR (WOUND CARE) ×2 IMPLANT
ELECT REM PT RETURN 9FT ADLT (ELECTROSURGICAL) ×2
ELECTRODE REM PT RTRN 9FT ADLT (ELECTROSURGICAL) ×1 IMPLANT
GAUZE SPONGE 4X4 12PLY STRL (GAUZE/BANDAGES/DRESSINGS) ×2 IMPLANT
GLOVE BIOGEL PI IND STRL 9 (GLOVE) ×1 IMPLANT
GLOVE BIOGEL PI INDICATOR 9 (GLOVE) ×1
GLOVE SURG ORTHO 9.0 STRL STRW (GLOVE) ×2 IMPLANT
GOWN STRL REUS W/ TWL XL LVL3 (GOWN DISPOSABLE) ×2 IMPLANT
GOWN STRL REUS W/TWL XL LVL3 (GOWN DISPOSABLE) ×4
KIT BASIN OR (CUSTOM PROCEDURE TRAY) ×2 IMPLANT
KIT TURNOVER KIT B (KITS) ×2 IMPLANT
NS IRRIG 1000ML POUR BTL (IV SOLUTION) ×2 IMPLANT
PACK ORTHO EXTREMITY (CUSTOM PROCEDURE TRAY) ×2 IMPLANT
PAD ARMBOARD 7.5X6 YLW CONV (MISCELLANEOUS) ×4 IMPLANT
STOCKINETTE IMPERVIOUS LG (DRAPES) IMPLANT
SUT ETHILON 2 0 PSLX (SUTURE) ×4 IMPLANT
TOWEL GREEN STERILE (TOWEL DISPOSABLE) ×2 IMPLANT
TUBE CONNECTING 12X1/4 (SUCTIONS) ×2 IMPLANT
YANKAUER SUCT BULB TIP NO VENT (SUCTIONS) ×2 IMPLANT

## 2020-03-02 NOTE — Anesthesia Procedure Notes (Signed)
Procedure Name: Intubation Date/Time: 03/02/2020 3:33 PM Performed by: Kathryne Hitch, CRNA Pre-anesthesia Checklist: Patient identified, Emergency Drugs available, Suction available and Patient being monitored Patient Re-evaluated:Patient Re-evaluated prior to induction Oxygen Delivery Method: Circle system utilized Preoxygenation: Pre-oxygenation with 100% oxygen Induction Type: IV induction Ventilation: Mask ventilation without difficulty Laryngoscope Size: Mac and 4 Grade View: Grade II Tube type: Oral Tube size: 7.5 mm Number of attempts: 1 Airway Equipment and Method: Stylet and Oral airway Placement Confirmation: ETT inserted through vocal cords under direct vision,  positive ETCO2 and breath sounds checked- equal and bilateral Secured at: 23 cm Tube secured with: Tape Dental Injury: Teeth and Oropharynx as per pre-operative assessment

## 2020-03-02 NOTE — Transfer of Care (Signed)
Immediate Anesthesia Transfer of Care Note  Patient: Jon Lall  Procedure(s) Performed: LEFT FOOT FOURTH AND FIFTH RAY AMPUTATION (Left Foot)  Patient Location: PACU  Anesthesia Type:General  Level of Consciousness: drowsy and patient cooperative  Airway & Oxygen Therapy: Patient Spontanous Breathing and Patient connected to face mask oxygen  Post-op Assessment: Report given to RN and Post -op Vital signs reviewed and stable  Post vital signs: Reviewed and stable  Last Vitals:  Vitals Value Taken Time  BP 160/76 03/02/20 1610  Temp 36.3 C 03/02/20 1610  Pulse 107 03/02/20 1611  Resp 28 03/02/20 1611  SpO2 98 % 03/02/20 1611  Vitals shown include unvalidated device data.  Last Pain:  Vitals:   03/02/20 1515  TempSrc: Oral  PainSc:       Patients Stated Pain Goal: 0 (03/01/20 2224)  Complications: No complications documented.

## 2020-03-02 NOTE — Progress Notes (Signed)
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 In the emergency room he was febrile, met sepsis criteria, work-up was concerning for necrotic wound on his left foot and acute kidney injury creatinine of 3.5. -Admitted with sepsis, necrotic diabetic foot ulcer and acute kidney injury.    Assessment & Plan:   Principal Problem:   Severe sepsis with acute organ dysfunction (HCC) Active Problems:   Hyponatremia   Diabetic polyneuropathy associated with type 2 diabetes mellitus ([place])   Acute kidney failure (HCC)   Diabetic ulcer of left foot (HCC)   Infection of left foot   1-Sepsis: POA Secondary to necrotic diabetic foot ulcer. Group C strep bacteremia. -Continue with ceftriaxone and daptomycin. (Vancomycin was discontinued due to severe AKI) -Blood cultures grew with group B strep -Dr. Sharol Given was consulted.  He recommended foot salvage intervention with fourth and fifth ray amputation.  Patient had a second opinion by Dr. Ginette Pitman who also recommended surgery. -Patient agreed to have surgery today. -ABIs normal.  2-AKI: Creatinine baseline 1.6 from 7/21st.  Patient presented with a creatinine at 3.5 Suspected to be ATN from sepsis, creatinine improving down to 2.3 today. -He received IV fluids.  3-Obesity, type 2 diabetes: BMI 44, weight loss was encouraged Hemoglobin A1c 6.4.  Continue with a sliding scale insulin  4-Abnormal chest x-ray, right upper lobe infiltrate Continue with incentive spirometry, with flutter valve. Reported some cough and shortness of breath.  Chest x-ray repeated today negative  5-Social;  Does not have a PCP, care  management has arranged follow-up with wellness clinic.  6-Abdominal pain: Abdominal exam benign.  No rigidity. KUB was negative for obstruction No bowel movement since admission, passing gas. Add PPI   7-Hyponatremia;  Stable. Monitor.   8-Anemia;  Hb range 9--7.9 for last 3 days.  Hb at 9 (11 months ago) Check anemia panel.  Check Hb to night, transfusion as need.      Nutrition Problem: Increased nutrient needs Etiology: wound healing    Signs/Symptoms: estimated needs    Interventions: Premier Protein, Juven  Estimated body mass index is 44.01 kg/m as calculated from the following:   Height as of this encounter: 6' 1" (1.854 m).   Weight as of this encounter: 151.3 kg.   DVT prophylaxis: Heparin Code Status: Full code Family Communication: Mother who was at bedside Disposition Plan:  Status is: Inpatient  Remains inpatient appropriate because:IV treatments appropriate due to intensity of illness or inability to take PO   Dispo: The patient is from: Home              Anticipated d/c is to: To be determined              Anticipated d/c date is: 3 days              Patient currently is not medically stable to d/c.        Consultants:   Dr. Sharol Given  Procedures:  ABI negative Antimicrobials:    Subjective: She still having some dry cough.  He reports shortness of breath since admission, not really worse.  He report some abdominal pain, like a knot.  Passing gas no bowel movement since  admission.  Declined laxative     Objective: Vitals:   03/02/20 0244 03/02/20 0503 03/02/20 0633 03/02/20 0900  BP:   (!) 154/93 (!) 145/93  Pulse:   (!) 106 (!) 104  Resp:   18 20  Temp:   99.6 F (37.6 C) 99 F (37.2 C)  TempSrc:   Oral Oral  SpO2: 99% 96% 98% 98%  Weight:      Height:        Intake/Output Summary (Last 24 hours) at 03/02/2020 1219 Last data filed at 03/02/2020 0800 Gross per 24 hour  Intake 893 ml  Output 0 ml  Net 893 ml    Filed Weights   02/28/20 1120  Weight: (!) 151.3 kg    Examination:  General exam: Appears calm and comfortable  Respiratory system: Clear to auscultation. Respiratory effort normal. Cardiovascular system: S1 & S2 heard, RRR. No JVD, murmurs, rubs, gallops or clicks. No pedal edema. Gastrointestinal system: Abdomen is nondistended, soft and nontender. No organomegaly or masses felt. Normal bowel sounds heard. Central nervous system: Alert and oriented. No focal neurological deficits. Extremities: BL LE with dressing. Left foot with dressing.    Data Reviewed: I have personally reviewed following labs and imaging studies  CBC: Recent Labs  Lab 02/28/20 1140 02/29/20 0714 03/01/20 0341 03/02/20 0048  WBC 16.2* 15.3* 14.5* 15.9*  NEUTROABS 14.8*  --   --   --   HGB 9.3* 7.9* 7.9* 7.5*  HCT 28.8* 24.7* 24.9* 23.2*  MCV 84.7 85.5 85.6 84.4  PLT 345 278 268 371   Basic Metabolic Panel: Recent Labs  Lab 02/28/20 1140 02/29/20 0714 03/01/20 0341 03/02/20 0048  NA 126* 130* 130* 130*  K 4.6 4.1 4.4 4.2  CL 94* 101 100 101  CO2 21* 20* 19* 18*  GLUCOSE 228* 130* 159* 150*  BUN 33* 35* 41* 44*  CREATININE 3.58* 3.18* 2.70* 2.35*  CALCIUM 8.3* 8.1* 8.1* 8.1*   GFR: Estimated Creatinine Clearance: 66 mL/min (A) (by C-G formula based on SCr of 2.35 mg/dL (H)). Liver Function Tests: Recent Labs  Lab 02/28/20 1140 02/29/20 0714  AST 32 39  ALT 29 33  ALKPHOS 89 84  BILITOT 0.6 0.5  PROT 8.3* 7.0  ALBUMIN 2.7* 2.0*   No results for input(s): LIPASE, AMYLASE in the last 168 hours. No results for input(s): AMMONIA in the last 168 hours. Coagulation Profile: Recent Labs  Lab 02/28/20 1140 02/29/20 0714  INR 1.3* 1.4*   Cardiac Enzymes: Recent Labs  Lab 02/29/20 1206  CKTOTAL 979*   BNP (last 3 results) No results for input(s): PROBNP in the last 8760 hours. HbA1C: Recent Labs    02/29/20 0714  HGBA1C 6.4*   CBG: Recent Labs  Lab 03/01/20 1215  03/01/20 1626 03/01/20 2221 03/02/20 0628 03/02/20 1142  GLUCAP 117* 112* 122* 129* 115*   Lipid Profile: No results for input(s): CHOL, HDL, LDLCALC, TRIG, CHOLHDL, LDLDIRECT in the last 72 hours. Thyroid Function Tests: No results for input(s): TSH, T4TOTAL, FREET4, T3FREE, THYROIDAB in the last 72 hours. Anemia Panel: No results for input(s): VITAMINB12, FOLATE, FERRITIN, TIBC, IRON, RETICCTPCT in the last 72 hours. Sepsis Labs: Recent Labs  Lab 02/28/20 1140 02/28/20 1540 02/29/20 0714  PROCALCITON  --   --  20.82  LATICACIDVEN 1.5 1.6  --     Recent Results (from the past 240 hour(s))  Culture, blood (Routine x 2)     Status: Abnormal   Collection Time: 02/28/20 11:40 AM  Specimen: BLOOD  Result Value Ref Range Status   Specimen Description   Final    BLOOD RIGHT ANTECUBITAL Performed at [facility], [address] [place], [place] [address]    Special Requests   Final    BOTTLES DRAWN AEROBIC AND ANAEROBIC Blood Culture adequate volume Performed at [facility] [place], [address] [place], [place] [address]    Culture  Setup Time   Final    GRAM POSITIVE COCCI IN CHAINS ANAEROBIC BOTTLE ONLY CRITICAL RESULT CALLED TO, READ BACK BY AND VERIFIED WITH: PHARMD H. VONDOHLEN [phone] [id number] FCP Performed at [facility], [address] , [place] [address]    Culture STREPTOCOCCUS GROUP C (A)  Final   Report Status 03/02/2020 FINAL  Final   Organism ID, Bacteria STREPTOCOCCUS GROUP C  Final      Susceptibility   Streptococcus group c - MIC*    CLINDAMYCIN >=1 RESISTANT Resistant     AMPICILLIN <=0.25 SENSITIVE Sensitive     ERYTHROMYCIN >=8 RESISTANT Resistant     VANCOMYCIN 0.5 SENSITIVE Sensitive     CEFTRIAXONE 0.25 SENSITIVE Sensitive     LEVOFLOXACIN <=0.25 SENSITIVE Sensitive     PENICILLIN Value in next row Sensitive      SENSITIVE<=0.06    * STREPTOCOCCUS GROUP C  Blood Culture ID Panel (Reflexed)     Status: Abnormal    Collection Time: 02/28/20 11:40 AM  Result Value Ref Range Status   Enterococcus faecalis NOT DETECTED NOT DETECTED Final   Enterococcus Faecium NOT DETECTED NOT DETECTED Final   Listeria monocytogenes NOT DETECTED NOT DETECTED Final   Staphylococcus species NOT DETECTED NOT DETECTED Final   Staphylococcus aureus (BCID) NOT DETECTED NOT DETECTED Final   Staphylococcus epidermidis NOT DETECTED NOT DETECTED Final   Staphylococcus lugdunensis NOT DETECTED NOT DETECTED Final   Streptococcus species DETECTED (A) NOT DETECTED Final    Comment: Not Enterococcus species, Streptococcus agalactiae, Streptococcus pyogenes, or Streptococcus pneumoniae. CRITICAL RESULT CALLED TO, READ BACK BY AND VERIFIED WITH: PHARMD H. VONDOHLEN [id number] FCP    Streptococcus agalactiae NOT DETECTED NOT DETECTED Final   Streptococcus pneumoniae NOT DETECTED NOT DETECTED Final   Streptococcus pyogenes NOT DETECTED NOT DETECTED Final   A.calcoaceticus-baumannii NOT DETECTED NOT DETECTED Final   Bacteroides fragilis NOT DETECTED NOT DETECTED Final   Enterobacterales NOT DETECTED NOT DETECTED Final   Enterobacter cloacae complex NOT DETECTED NOT DETECTED Final   Escherichia coli NOT DETECTED NOT DETECTED Final   Klebsiella aerogenes NOT DETECTED NOT DETECTED Final   Klebsiella oxytoca NOT DETECTED NOT DETECTED Final   Klebsiella pneumoniae NOT DETECTED NOT DETECTED Final   Proteus species NOT DETECTED NOT DETECTED Final   Salmonella species NOT DETECTED NOT DETECTED Final   Serratia marcescens NOT DETECTED NOT DETECTED Final   Haemophilus influenzae NOT DETECTED NOT DETECTED Final   Neisseria meningitidis NOT DETECTED NOT DETECTED Final   Pseudomonas aeruginosa NOT DETECTED NOT DETECTED Final   Stenotrophomonas maltophilia NOT DETECTED NOT DETECTED Final   Candida albicans NOT DETECTED NOT DETECTED Final   Candida auris NOT DETECTED NOT DETECTED Final   Candida glabrata NOT DETECTED NOT DETECTED Final   Candida  krusei NOT DETECTED NOT DETECTED Final   Candida parapsilosis NOT DETECTED NOT DETECTED Final   Candida tropicalis NOT DETECTED NOT DETECTED Final   Cryptococcus neoformans/gattii NOT DETECTED NOT DETECTED Final    Comment: Performed at  Hospital [facility], [address] ,    [address]  Culture, blood (Routine x 2)     Status: None (Preliminary result)   Collection Time: 02/28/20 12:20 PM   Specimen: BLOOD LEFT HAND  Result Value Ref Range Status   Specimen Description   Final    BLOOD LEFT HAND Performed at [facility] [place], [address] [place], [place] [address]    Special Requests   Final    BOTTLES DRAWN AEROBIC AND ANAEROBIC Blood Culture adequate volume Performed at [facility] [place], [address] [place], [place] [address]    Culture   Final    NO GROWTH 2 DAYS Performed at [facility], [address] [place], [place] [address]    Report Status PENDING  Incomplete  Respiratory Panel by RT PCR (Flu A&B, Covid) - Nasopharyngeal Swab     Status: None   Collection Time: 02/28/20 12:29 PM   Specimen: Nasopharyngeal Swab  Result Value Ref Range Status   SARS Coronavirus 2 by RT PCR NEGATIVE NEGATIVE Final    Comment: (NOTE) SARS-CoV-2 target nucleic acids are NOT DETECTED.  The SARS-CoV-2 RNA is generally detectable in upper respiratoy specimens during the acute phase of infection. The lowest concentration of SARS-CoV-2 viral copies this assay can detect is 131 copies/mL. A negative result does not preclude SARS-Cov-2 infection and should not be used as the sole basis for treatment or other patient management decisions. A negative result may occur with  improper specimen collection/handling, submission of specimen other than nasopharyngeal swab, presence of viral mutation(s) within the areas targeted by this assay, and inadequate number of viral copies (<131 copies/mL). A negative result must be combined with clinical observations,  patient history, and epidemiological information. The expected result is Negative.  Fact Sheet for Patients:  https://www.fda.gov/media/142436/download  Fact Sheet for Healthcare Providers:  https://www.fda.gov/media/142435/download  This test is no t yet approved or cleared by the [place] FDA and  has been authorized for detection and/or diagnosis of SARS-CoV-2 by FDA under an Emergency Use Authorization (EUA). This EUA will remain  in effect (meaning this test can be used) for the duration of the COVID-19 declaration under Section 564(b)(1) of the Act, 21 U.S.C. section 360bbb-3(b)(1), unless the authorization is terminated or revoked sooner.     Influenza A by PCR NEGATIVE NEGATIVE Final   Influenza B by PCR NEGATIVE NEGATIVE Final    Comment: (NOTE) The Xpert Xpress SARS-CoV-2/FLU/RSV assay is intended as an aid in  the diagnosis of influenza from Nasopharyngeal swab specimens and  should not be used as a sole basis for treatment. Nasal washings and  aspirates are unacceptable for Xpert Xpress SARS-CoV-2/FLU/RSV  testing.  Fact Sheet for Patients: https://www.fda.gov/media/142436/download  Fact Sheet for Healthcare Providers: https://www.fda.gov/media/142435/download  This test is not yet approved or cleared by the [place] FDA and  has been authorized for detection and/or diagnosis of SARS-CoV-2 by  FDA under an Emergency Use Authorization (EUA). This EUA will remain  in effect (meaning this test can be used) for the duration of the  Covid-19 declaration under Section 564(b)(1) of the Act, 21  U.S.C. section 360bbb-3(b)(1), unless the authorization is  terminated or revoked. Performed at [facility] [place], [address] [place], [place] [address]   Surgical pcr screen     Status: None   Collection Time: 03/02/20  8:59 AM   Specimen: Nasal Mucosa; Nasal Swab  Result Value Ref Range Status   MRSA, PCR NEGATIVE NEGATIVE Final   Staphylococcus    aureus NEGATIVE NEGATIVE Final    Comment: (NOTE) The Xpert SA Assay (FDA approved for NASAL specimens in patients 22 years of age and older), is one component of a comprehensive surveillance program. It is not intended to diagnose infection nor to guide or monitor treatment. Performed at [facility], [address] [place], [place] [address]          Radiology Studies: DG Abd 1 View  Result Date: 03/02/2020 CLINICAL DATA:  Short of breath when lying down as well as abdominal pain today. EXAM: ABDOMEN - 1 VIEW COMPARISON:  None. FINDINGS: There is no bowel dilation to suggest obstruction. Soft tissues and skeletal structures are unremarkable. IMPRESSION: Negative. Electronically Signed   By: David  Ormond M.D.   On: 03/02/2020 11:37   DG CHEST PORT 1 VIEW  Result Date: 03/02/2020 CLINICAL DATA:  Short of breath when lying down as well as abdominal pain today. EXAM: PORTABLE CHEST 1 VIEW COMPARISON:  02/28/2020 and earlier exams. FINDINGS: Cardiac silhouette normal in size.  No mediastinal or hilar masses. Clear lungs.  No convincing pleural effusion and no pneumothorax. Skeletal structures are grossly intact. IMPRESSION: No acute cardiopulmonary disease. Electronically Signed   By: David  Ormond M.D.   On: 03/02/2020 11:36   DG Foot Complete Left  Result Date: 03/02/2020 CLINICAL DATA:  Open wound left fifth metatarsal EXAM: LEFT FOOT - COMPLETE 3+ VIEW COMPARISON:  02/28/2020 FINDINGS: Loss of cortical definition of the fifth metatarsal head with focal osteopenia. The base of the fifth toe proximal phalanx also demonstrates relative demineralization. There is a fracture through the base of the fifth toe proximal phalanx. No dislocation. Extensive soft tissue swelling and ulceration is again noted overlying the fifth MTP joint. Soft tissue gas is seen throughout the fifth toe tracking proximally to the level of the mid fifth metatarsal diaphysis. IMPRESSION: 1. 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 2. Fracture through the base of the fifth toe proximal phalanx. 3. Soft tissue gas throughout the fifth toe tracking proximally to the level of the mid fifth metatarsal diaphysis which may be in part related to patient's open wound, although findings remain concerning for gas-forming infection. No appreciable progression in the degree of soft tissue gas compared to the previous study. Electronically Signed   By: Nicholas  Plundo D.O.   On: 03/02/2020 08:30   VAS US ABI WITH/WO TBI  Result Date: 02/29/2020 LOWER EXTREMITY DOPPLER STUDY Indications: Ulceration, and gangrene. High Risk Factors: Diabetes. Other Factors: Right mid foot amputation.  Limitations: Today's exam was limited due to IV in right AC, bandages on both              feet, edema, and tachycardia secondary to sepsis. Comparison Study: No prior study Performing Technologist: Kanady, Candace RVS  Examination Guidelines: A complete evaluation includes at minimum, Doppler waveform signals and systolic blood pressure reading at the level of bilateral brachial, anterior tibial, and posterior tibial arteries, when vessel segments are accessible. Bilateral testing is considered an integral part of a complete examination. Photoelectric Plethysmograph (PPG) waveforms and toe systolic pressure readings are included as required and additional duplex testing as needed. Limited examinations for reoccurring indications may be performed as noted.  ABI Findings: +---------+------------------+-----+-----------+----------+ Right    Rt Pressure (mmHg)IndexWaveform   Comment    +---------+------------------+-----+-----------+----------+ Brachial                          multiphasicIV in AC   +---------+------------------+-----+-----------+----------+ PTA      155               1.18 multiphasic           +---------+------------------+-----+-----------+----------+ DP        142               1.08 multiphasic           +---------+------------------+-----+-----------+----------+ Great Toe                                  amputation +---------+------------------+-----+-----------+----------+ +---------+------------------+-----+-----------+-------+ Left     Lt Pressure (mmHg)IndexWaveform   Comment +---------+------------------+-----+-----------+-------+ Brachial 131                    multiphasic        +---------+------------------+-----+-----------+-------+ PTA      142               1.08 multiphasic        +---------+------------------+-----+-----------+-------+ DP       140               1.07 multiphasic        +---------+------------------+-----+-----------+-------+ Great Toe93                0.71                    +---------+------------------+-----+-----------+-------+ +-------+-----------+-----------+------------+------------+ ABI/TBIToday's ABIToday's TBIPrevious ABIPrevious TBI +-------+-----------+-----------+------------+------------+ Right  1.18       amp                                 +-------+-----------+-----------+------------+------------+ Left   1.08       0.71                                +-------+-----------+-----------+------------+------------+  Summary: Right: Resting right ankle-brachial index is within normal range. No evidence of significant right lower extremity arterial disease. Left: Resting left ankle-brachial index is within normal range. No evidence of significant left lower extremity arterial disease. The left toe-brachial index is normal.  *See table(s) above for measurements and observations.  Electronically signed by Brandon Cain MD on 02/29/2020 at 4:30:23 PM.    Final         Scheduled Meds: . sodium chloride   Intravenous Once  . heparin  5,000 Units Subcutaneous Q8H  . insulin aspart  0-5 Units Subcutaneous QHS  . insulin aspart  0-9 Units Subcutaneous TID WC  .  multivitamin with minerals  1 tablet Oral Daily  . pantoprazole (PROTONIX) IV  40 mg Intravenous Q24H  . Ensure Max Protein  11 oz Oral TID   Continuous Infusions: .  ceFAZolin (ANCEF) IV    . cefTRIAXone (ROCEPHIN)  IV 2 g (03/01/20 2219)  . DAPTOmycin (CUBICIN)  IV 650 mg (03/01/20 2055)     LOS: 3 days    Time spent: 35 minutes.     Belkys A Regalado, MD [facility]   If 7PM-7AM, please contact night-coverage [email or web address]  03/02/2020, 12:19 PM  

## 2020-03-02 NOTE — Progress Notes (Signed)
Patient ID: Ronnie Shaw, male   DOB: Jul 25, 1980, 39 y.o.   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  Patient had a second opinion with Dr. Carola Frost.  Dr. Carola Frost recommended surgical intervention as well and discussed the risks of loss of limb.  Patient's mother was at bedside this morning I reviewed the findings with abscess osteomyelitis and gangrene.  Recommended proceeding with surgery as soon as possible for limb salvage.  Patient's and his mother agreed to proceed with surgery and state they wish for me to proceed with surgery.  I will consult infectious disease, will obtain tissue samples at surgery, anticipate patient will need prolonged IV antibiotics for potential limb salvage.  I will repeat an x-ray today to see if there is any further progression of the air in the soft tissue.  Patient will be n.p.o. and will be added on for surgery today.  Risks and benefits of surgery were discussed including potential for additional surgery.  Patient and his mother state they understand and wish to proceed at this time.

## 2020-03-02 NOTE — Progress Notes (Signed)
This RN was setting up patient for blood transfusion, during pre vital check, pt had temp of 100.3. MD was notified and instructed this RN to hold blood transfusion for now and give PRN tylenol, resume after temp has decreased.   Pre-op made aware during report.
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
## 2020-03-02 NOTE — Progress Notes (Signed)
Anesthesia requesting blood be rapidly infused prior to surgery. Orders received and carried out

## 2020-03-02 NOTE — Progress Notes (Signed)
Pt. Ordered pizza for dinner after returning from surgery, Pt. Educated on Carb modified diet.

## 2020-03-02 NOTE — Telephone Encounter (Signed)
Ronnie Shaw with [facility] hospitals callled stating she needed [name] to clarify on the orders she gave for after the pts surgery today. [name] [facility] # [phone]

## 2020-03-02 NOTE — Progress Notes (Signed)
Patient complained about trouble breathing last night, upon assessment, oxygen level was 99 on room air and diminished breath sounds bilateral lower lungs. Patient advised that he sometimes "stops breathing in his sleep".  Pulse oximetry monitor was placed on patient and monitoring throughout the night. Oxygen sats stayed in high 90s throughout the night. Will continue to monitor.   Donia Guiles, RN.

## 2020-03-02 NOTE — Plan of Care (Signed)
  Problem: Education: Goal: Knowledge of General Education information will improve Description Including pain rating scale, medication(s)/side effects and non-pharmacologic comfort measures Outcome: Progressing   

## 2020-03-02 NOTE — Op Note (Signed)
03/02/2020  4:17 PM  PATIENT:  Ronnie Shaw    PRE-OPERATIVE DIAGNOSIS:  osteomylitis  POST-OPERATIVE DIAGNOSIS: Gangrene left foot with osteomyelitis fifth and fourth metatarsals with necrotizing fasciitis and abscess  PROCEDURE:  LEFT FOOT FOURTH AND FIFTH RAY AMPUTATION Extensive excisional debridement of skin and soft tissue muscle and fascia. Application of a cleanse choice installation of wound VAC.  SURGEON:  [name], MD  PHYSICIAN ASSISTANT:None ANESTHESIA:   General  PREOPERATIVE INDICATIONS:  Ronnie Shaw is a  39 y.o. male with a diagnosis of osteomylitis who failed conservative measures and elected for surgical management.    The risks benefits and alternatives were discussed with the patient preoperatively including but not limited to the risks of infection, bleeding, nerve injury, cardiopulmonary complications, the need for revision surgery, among others, and the patient was willing to proceed.  OPERATIVE IMPLANTS: Cleanse choice wound VAC x2 sponges.  @ENCIMAGES @  OPERATIVE FINDINGS: Patient had osteomyelitis involving the fourth and fifth metatarsals.  There was gangrenous changes of the soft tissue and necrotizing fasciitis of the fascia including a large abscess.  The toes and metatarsals were sent for cultures and the deep necrotic fascia was sent separately for cultures identified as specimen #2.  OPERATIVE PROCEDURE: Patient was brought the operating room and underwent a general anesthetic.  After adequate levels anesthesia were obtained patient's left lower extremity was prepped using DuraPrep draped into a sterile field a timeout was called.  An elliptical incision was made around the fourth and fifth metatarsals to excise the gangrenous skin.  The metatarsals were amputated through the base with an oscillating saw.  This tissue was sent for cultures there is also abscess with this tissue.  The deep tissue had black necrotizing fascial changes and a  rondure was used to remove the necrotic fascia necrotic muscle and tendons.  This was sent for deep tissue culture #2.  The wound was irrigated with normal saline and there was no further necrotic tissue visible.  The wound was then packed open with the reticulated cleanse choice foam and then the solid cleanse choice foam.  This was covered with derma tack this had a good suction fit and this was overwrapped with Coban.  The wound VAC was sent for 10 minutes of dwell time 2 hours of suction and 10 cc of irrigation.  Patient was extubated taken to the PACU in stable condition.   DISCHARGE PLANNING:  Antibiotic duration: Continue IV antibiotics  Weightbearing: Strict nonweightbearing on the left  Pain medication: Opioid pathway  Dressing care/ Wound VAC: Installation wound VAC 10 cc of irrigation 10 minutes dwell time and 2 hours of suction  Ambulatory devices: Walker  Discharge to: Plan to return to the operating room on Friday for further surgical debridement and attempted foot salvage versus amputation.  Follow-up: In the office 1 week post operative.

## 2020-03-02 NOTE — Anesthesia Preprocedure Evaluation (Addendum)
Anesthesia Evaluation  Patient identified by MRN, date of birth, ID band Patient awake    Reviewed: Allergy & Precautions, NPO status , Patient's Chart, lab work & pertinent test results  Airway Mallampati: II  TM Distance: >3 FB Neck ROM: Full    Dental  (+) Poor Dentition, Missing, Dental Advisory Given, Chipped,    Pulmonary asthma , sleep apnea , pneumonia, unresolved, Patient abstained from smoking., former smoker,  OSA- no CPAP, unable to tolerate Asthma- last inhaler use about 1-2 years ago Sarcoidosis- no meds   breath sounds clear to auscultation       Cardiovascular negative cardio ROS   Rhythm:Regular Rate:Tachycardia     Neuro/Psych Seizures -, Well Controlled,  PSYCHIATRIC DISORDERS Depression Peripheral neuropathy  Neuromuscular disease    GI/Hepatic negative GI ROS, (+)     substance abuse  marijuana use,   Endo/Other  diabetes, Well Controlled, Type 2, Insulin DependentMorbid obesityObesity BMI 36 Last a1c 6.4 FS this AM 140 Does not use insulin at home  Renal/GU Renal disease     Musculoskeletal negative musculoskeletal ROS (+)   Abdominal (+) + obese,   Peds  Hematology  (+) Blood dyscrasia, anemia ,   Anesthesia Other Findings   Reproductive/Obstetrics                             Anesthesia Physical  Anesthesia Plan  ASA: IV  Anesthesia Plan: General   Post-op Pain Management:    Induction: Intravenous  PONV Risk Score and Plan: 2 and Ondansetron, Dexamethasone and Treatment may vary due to age or medical condition  Airway Management Planned: Oral ETT  Additional Equipment: None  Intra-op Plan:   Post-operative Plan: Extubation in OR  Informed Consent: I have reviewed the patients History and Physical, chart, labs and discussed the procedure including the risks, benefits and alternatives for the proposed anesthesia with the patient or authorized  representative who has indicated his/her understanding and acceptance.     Dental advisory given  Plan Discussed with: CRNA  Anesthesia Plan Comments: (Received report. Prior patient out of the room, and patient in preprocedure ~45 minutes.)       Anesthesia Quick Evaluation

## 2020-03-03 ENCOUNTER — Other Ambulatory Visit: Payer: Self-pay | Admitting: Physician Assistant

## 2020-03-03 ENCOUNTER — Inpatient Hospital Stay (HOSPITAL_COMMUNITY): Payer: Self-pay

## 2020-03-03 ENCOUNTER — Encounter (HOSPITAL_COMMUNITY): Payer: Self-pay | Admitting: Orthopedic Surgery

## 2020-03-03 DIAGNOSIS — R7881 Bacteremia: Secondary | ICD-10-CM

## 2020-03-03 LAB — RETICULOCYTES
Immature Retic Fract: 9.1 % (ref 2.3–15.9)
RBC.: 3 MIL/uL — ABNORMAL LOW (ref 4.22–5.81)
Retic Count, Absolute: 19.8 K/uL (ref 19.0–186.0)
Retic Ct Pct: 0.7 % (ref 0.4–3.1)

## 2020-03-03 LAB — TYPE AND SCREEN
ABO/RH(D): B POS
Antibody Screen: NEGATIVE
Unit division: 0

## 2020-03-03 LAB — CBC
HCT: 25.7 % — ABNORMAL LOW (ref 39.0–52.0)
Hemoglobin: 8.2 g/dL — ABNORMAL LOW (ref 13.0–17.0)
MCH: 27.2 pg (ref 26.0–34.0)
MCHC: 31.9 g/dL (ref 30.0–36.0)
MCV: 85.4 fL (ref 80.0–100.0)
Platelets: 336 K/uL (ref 150–400)
RBC: 3.01 MIL/uL — ABNORMAL LOW (ref 4.22–5.81)
RDW: 15.7 % — ABNORMAL HIGH (ref 11.5–15.5)
WBC: 19.6 K/uL — ABNORMAL HIGH (ref 4.0–10.5)
nRBC: 0 % (ref 0.0–0.2)

## 2020-03-03 LAB — GLUCOSE, CAPILLARY
Glucose-Capillary: 158 mg/dL — ABNORMAL HIGH (ref 70–99)
Glucose-Capillary: 174 mg/dL — ABNORMAL HIGH (ref 70–99)
Glucose-Capillary: 123 mg/dL — ABNORMAL HIGH (ref 70–99)
Glucose-Capillary: 169 mg/dL — ABNORMAL HIGH (ref 70–99)

## 2020-03-03 LAB — BASIC METABOLIC PANEL WITH GFR
Anion gap: 10 (ref 5–15)
BUN: 51 mg/dL — ABNORMAL HIGH (ref 6–20)
CO2: 18 mmol/L — ABNORMAL LOW (ref 22–32)
Calcium: 8.4 mg/dL — ABNORMAL LOW (ref 8.9–10.3)
Chloride: 103 mmol/L (ref 98–111)
Creatinine, Ser: 2.33 mg/dL — ABNORMAL HIGH (ref 0.61–1.24)
GFR, Estimated: 36 mL/min — ABNORMAL LOW (ref >60)
Glucose, Bld: 209 mg/dL — ABNORMAL HIGH (ref 70–99)
Potassium: 5.1 mmol/L (ref 3.5–5.1)
Sodium: 131 mmol/L — ABNORMAL LOW (ref 135–145)

## 2020-03-03 LAB — ECHOCARDIOGRAM COMPLETE
Area-P 1/2: 4.21 cm2
Calc EF: 55.5 %
Height: 73 in
S' Lateral: 4 cm
Single Plane A2C EF: 57.8 %
Single Plane A4C EF: 51.5 %
Weight: 5337.6 oz

## 2020-03-03 LAB — BPAM RBC
Blood Product Expiration Date: 202111242359
ISSUE DATE / TIME: 202111171354
Unit Type and Rh: 1700

## 2020-03-03 LAB — IRON AND TIBC
Iron: 23 ug/dL — ABNORMAL LOW (ref 45–182)
Saturation Ratios: 15 % — ABNORMAL LOW (ref 17.9–39.5)
TIBC: 157 ug/dL — ABNORMAL LOW (ref 250–450)
UIBC: 134 ug/dL

## 2020-03-03 LAB — FOLATE: Folate: 14.7 ng/mL (ref >5.9)

## 2020-03-03 LAB — VITAMIN B12: Vitamin B-12: 836 pg/mL (ref 180–914)

## 2020-03-03 LAB — FERRITIN: Ferritin: 591 ng/mL — ABNORMAL HIGH (ref 24–336)

## 2020-03-03 MED ORDER — SODIUM ZIRCONIUM CYCLOSILICATE 10 G PO PACK
10.0000 g | PACK | Freq: Once | ORAL | Status: AC
  Administered 2020-03-03: 10 g via ORAL
  Filled 2020-03-03: qty 1

## 2020-03-03 MED ORDER — FERROUS SULFATE 325 (65 FE) MG PO TABS: 325.0000 mg | ORAL_TABLET | Freq: Every day | ORAL | Status: DC

## 2020-03-03 MED ORDER — PANTOPRAZOLE SODIUM 40 MG PO TBEC
40.0000 mg | DELAYED_RELEASE_TABLET | Freq: Every day | ORAL | Status: DC
  Administered 2020-03-03 – 2020-03-11 (×9): 40 mg via ORAL
  Filled 2020-03-03 (×9): qty 1

## 2020-03-03 MED ORDER — POLYETHYLENE GLYCOL 3350 17 G PO PACK: 17.0000 g | PACK | Freq: Every day | ORAL | Status: DC | PRN

## 2020-03-03 NOTE — Progress Notes (Signed)
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 In the emergency room he was febrile, met sepsis criteria, work-up was concerning for necrotic wound on his left foot and acute kidney injury creatinine of 3.5. -Admitted with sepsis, necrotic diabetic foot ulcer and acute kidney injury.    Assessment & Plan:   Principal Problem:   Severe sepsis with acute organ dysfunction (HCC) Active Problems:   Hyponatremia   Diabetic polyneuropathy associated with type 2 diabetes mellitus (HCC)   Acute kidney failure (HCC)   Diabetic ulcer of left foot (HCC)   Infection of left foot   Necrotizing fasciitis ([place])   Subacute osteomyelitis, left ankle and foot ([place])   1-Sepsis: POA Secondary  Gangrene left foot with osteomyelitis fifth and fourth metatarsal with necrotizing fasciitis and abscess Group C strep bacteremia. -Continue with ceftriaxone and daptomycin. (Vancomycin was discontinued due to severe AKI) -Blood cultures grew with group B strep. -Dr. Sharol Given was consulted.  He recommended foot salvage intervention with fourth and fifth ray amputation.  Patient had a second opinion by Dr. Ginette Pitman who also recommended surgery. -ABIs normal. -Patient underwent left foot fourth and fifth ray amputation, extensive excisional debridement of the skin and soft tissue muscle and fascia application of wound VAC -Appreciate ID evaluation. -Follow echo. -Patient will require another I&D on 11/19. -Lower extremity ultrasound due to leg edema  2-AKI: Creatinine baseline 1.6 from 7/21st.  Patient presented with a creatinine at 3.5 Suspected to be ATN from sepsis, creatinine  trending down. -Creatinine 2.3 stable today -He received IV fluids. -We will increase potassium, will give a dose of Lokelma  3-Obesity, type 2 diabetes: BMI 44, weight loss was encouraged Hemoglobin A1c 6.4.  Continue with a sliding scale insulin  4-Abnormal chest x-ray, right upper lobe infiltrate Continue with incentive spirometry, with flutter valve. Reported some cough and shortness of breath.  Chest x-ray repeated today negative  5-Social;  Does not have a PCP, care management has arranged follow-up with wellness clinic.  6-Abdominal pain: Abdominal exam benign.  No rigidity. KUB was negative for obstruction No bowel movement since admission, passing gas. Continue with PPI Port improvement of abdominal pain  7-Hyponatremia;  Stable. Monitor.   8-Anemia;  Hb range 9--7.9 for last 3 days.  Hb at 9 (11 months ago) anemia panel; iron deficiency anemia.  Plan to start oral iron when infection sources controlled  Check Hb to night, transfusion as need.      Nutrition Problem: Increased nutrient needs Etiology: wound healing    Signs/Symptoms: estimated needs    Interventions: Premier Protein, Juven  Estimated body mass index is 44.01 kg/m as calculated from the following:   Height as of this encounter: '6\' 1"'  (1.854 m).   Weight as of this encounter: 151.3 kg.   DVT prophylaxis: Heparin Code Status: Full code Family Communication: Care discussed with patient Disposition Plan:  Status is: Inpatient  Remains inpatient appropriate because:IV treatments appropriate due to intensity of illness or inability to take PO   Dispo: The patient is from: Home              Anticipated d/c is to: To be determined  Anticipated d/c date is: 3 days              Patient currently is not medically stable to d/c.        Consultants:   Dr. Sharol Given  Procedures:  ABI negative Antimicrobials:    Subjective: He denies chest pain or shortness of  breath. He reports abdominal pain has improved, feels that he needs to have a bowel movement. Port pain left lower extremity  Objective: Vitals:   03/02/20 2015 03/03/20 0005 03/03/20 0549 03/03/20 1019  BP: (!) 152/89 (!) 148/88 (!) 177/97 (!) 141/89  Pulse: 96 94 80 79  Resp: '18 18 18 19  ' Temp: 99.1 F (37.3 C) 99.1 F (37.3 C) 98.2 F (36.8 C) 98.5 F (36.9 C)  TempSrc:  Oral Oral   SpO2: 96% 98% 97% 99%  Weight:      Height:        Intake/Output Summary (Last 24 hours) at 03/03/2020 1303 Last data filed at 03/03/2020 0800 Gross per 24 hour  Intake 1315 ml  Output 1500 ml  Net -185 ml   Filed Weights   02/28/20 1120 03/02/20 1423  Weight: (!) 151.3 kg (!) 151.3 kg    Examination:  General exam: NAD Respiratory system: CTA Cardiovascular system: S 1, S 2 RRR Gastrointestinal system: BS present, soft, nt Central nervous system: alert, follows command Extremities: BL LE with dressing. Left foot with wound VAC in place, calf tenderness   Data Reviewed: I have personally reviewed following labs and imaging studies  CBC: Recent Labs  Lab 02/28/20 1140 02/28/20 1140 02/29/20 0714 03/01/20 0341 03/02/20 0048 03/02/20 1828 03/03/20 0127  WBC 16.2*  --  15.3* 14.5* 15.9*  --  19.6*  NEUTROABS 14.8*  --   --   --   --   --   --   HGB 9.3*   < > 7.9* 7.9* 7.5* 8.0* 8.2*  HCT 28.8*   < > 24.7* 24.9* 23.2* 24.9* 25.7*  MCV 84.7  --  85.5 85.6 84.4  --  85.4  PLT 345  --  278 268 289  --  336   < > = values in this interval not displayed.   Basic Metabolic Panel: Recent Labs  Lab 02/28/20 1140 02/29/20 0714 03/01/20 0341 03/02/20 0048 03/03/20 0127  NA 126* 130* 130* 130* 131*  K 4.6 4.1 4.4 4.2 5.1  CL 94* 101 100 101 103  CO2 21* 20* 19* 18* 18*  GLUCOSE 228* 130* 159* 150* 209*  BUN 33* 35* 41* 44* 51*  CREATININE 3.58* 3.18* 2.70* 2.35* 2.33*  CALCIUM 8.3* 8.1* 8.1* 8.1* 8.4*   GFR: Estimated Creatinine Clearance: 66.6 mL/min (A) (by C-G  formula based on SCr of 2.33 mg/dL (H)). Liver Function Tests: Recent Labs  Lab 02/28/20 1140 02/29/20 0714  AST 32 39  ALT 29 33  ALKPHOS 89 84  BILITOT 0.6 0.5  PROT 8.3* 7.0  ALBUMIN 2.7* 2.0*   No results for input(s): LIPASE, AMYLASE in the last 168 hours. No results for input(s): AMMONIA in the last 168 hours. Coagulation Profile: Recent Labs  Lab 02/28/20 1140 02/29/20 0714  INR 1.3* 1.4*   Cardiac Enzymes: Recent Labs  Lab 02/29/20 1206  CKTOTAL 979*   BNP (last 3 results) No results for input(s): PROBNP in the last 8760 hours. HbA1C: No results for input(s): HGBA1C in the last 72 hours. CBG: Recent Labs  Lab 03/02/20 1333 03/02/20 1614 03/02/20 2126 03/03/20 0705 03/03/20 1153  GLUCAP 117* 122* 213* 158* 174*   Lipid Profile: No results for input(s): CHOL, HDL, LDLCALC, TRIG, CHOLHDL, LDLDIRECT in the last 72 hours. Thyroid Function Tests: No results for input(s): TSH, T4TOTAL, FREET4, T3FREE, THYROIDAB in the last 72 hours. Anemia Panel: Recent Labs    03/03/20 0127  VITAMINB12 836  FOLATE 14.7  FERRITIN 591*  TIBC 157*  IRON 23*  RETICCTPCT 0.7   Sepsis Labs: Recent Labs  Lab 02/28/20 1140 02/28/20 1540 02/29/20 0714  PROCALCITON  --   --  20.82  LATICACIDVEN 1.5 1.6  --     Recent Results (from the past 240 hour(s))  Culture, blood (Routine x 2)     Status: Abnormal   Collection Time: 02/28/20 11:40 AM   Specimen: BLOOD  Result Value Ref Range Status   Specimen Description   Final    BLOOD RIGHT ANTECUBITAL Performed at [facility], [place] [address] [place], [place] [address]    Special Requests   Final    BOTTLES DRAWN AEROBIC AND ANAEROBIC Blood Culture adequate volume Performed at [facility], [address] [place], [place] [address]    Culture  Setup Time   Final    GRAM POSITIVE COCCI IN CHAINS ANAEROBIC BOTTLE ONLY CRITICAL RESULT CALLED TO, READ BACK BY AND VERIFIED WITH: Earmon Phoenix [id number]  [id number] FCP Performed at [facility], [place] [address] [place], [place] [address]    Culture STREPTOCOCCUS GROUP C (A)  Final   Report Status 03/02/2020 FINAL  Final   Organism ID, Bacteria STREPTOCOCCUS GROUP C  Final      Susceptibility   Streptococcus group c - MIC*    CLINDAMYCIN >=1 RESISTANT Resistant     AMPICILLIN <=0.25 SENSITIVE Sensitive     ERYTHROMYCIN >=8 RESISTANT Resistant     VANCOMYCIN 0.5 SENSITIVE Sensitive     CEFTRIAXONE 0.25 SENSITIVE Sensitive     LEVOFLOXACIN <=0.25 SENSITIVE Sensitive     PENICILLIN Value in next row Sensitive      SENSITIVE<=0.06    * STREPTOCOCCUS GROUP C  Blood Culture ID Panel (Reflexed)     Status: Abnormal   Collection Time: 02/28/20 11:40 AM  Result Value Ref Range Status   Enterococcus faecalis NOT DETECTED NOT DETECTED Final   Enterococcus Faecium NOT DETECTED NOT DETECTED Final   Listeria monocytogenes NOT DETECTED NOT DETECTED Final   Staphylococcus species NOT DETECTED NOT DETECTED Final   Staphylococcus aureus (BCID) NOT DETECTED NOT DETECTED Final   Staphylococcus epidermidis NOT DETECTED NOT DETECTED Final   Staphylococcus lugdunensis NOT DETECTED NOT DETECTED Final   Streptococcus species DETECTED (A) NOT DETECTED Final    Comment: Not Enterococcus species, Streptococcus agalactiae, Streptococcus pyogenes, or Streptococcus pneumoniae. CRITICAL RESULT CALLED TO, READ BACK BY AND VERIFIED WITH: PHARMD H. VONDOHLEN [id number] FCP    Streptococcus agalactiae NOT DETECTED NOT DETECTED Final   Streptococcus pneumoniae NOT DETECTED NOT DETECTED Final   Streptococcus pyogenes NOT DETECTED NOT DETECTED Final   A.calcoaceticus-baumannii NOT DETECTED NOT DETECTED Final   Bacteroides fragilis NOT DETECTED NOT DETECTED Final   Enterobacterales NOT DETECTED NOT DETECTED Final   Enterobacter cloacae complex NOT DETECTED NOT DETECTED Final   Escherichia coli NOT DETECTED NOT DETECTED Final   Klebsiella aerogenes NOT DETECTED NOT  DETECTED Final   Klebsiella oxytoca NOT DETECTED NOT DETECTED Final   Klebsiella pneumoniae NOT DETECTED NOT DETECTED Final   Proteus species NOT DETECTED NOT DETECTED Final   Salmonella  species NOT DETECTED NOT DETECTED Final   Serratia marcescens NOT DETECTED NOT DETECTED Final   Haemophilus influenzae NOT DETECTED NOT DETECTED Final   Neisseria meningitidis NOT DETECTED NOT DETECTED Final   Pseudomonas aeruginosa NOT DETECTED NOT DETECTED Final   Stenotrophomonas maltophilia NOT DETECTED NOT DETECTED Final   Candida albicans NOT DETECTED NOT DETECTED Final   Candida auris NOT DETECTED NOT DETECTED Final   Candida glabrata NOT DETECTED NOT DETECTED Final   Candida krusei NOT DETECTED NOT DETECTED Final   Candida parapsilosis NOT DETECTED NOT DETECTED Final   Candida tropicalis NOT DETECTED NOT DETECTED Final   Cryptococcus neoformans/gattii NOT DETECTED NOT DETECTED Final    Comment: Performed at [facility], [place] [address], [place], [place] [address]  Culture, blood (Routine x 2)     Status: None (Preliminary result)   Collection Time: 02/28/20 12:20 PM   Specimen: BLOOD LEFT HAND  Result Value Ref Range Status   Specimen Description   Final    BLOOD LEFT HAND Performed at [facility], [place] [place], [place] [address]    Special Requests   Final    BOTTLES DRAWN AEROBIC AND ANAEROBIC Blood Culture adequate volume Performed at [facility], [place] [place], [place] [address]    Culture   Final    NO GROWTH 4 DAYS Performed at [facility], [place] [address] [place],  [address]    Report Status PENDING  Incomplete  Respiratory Panel by RT PCR (Flu A&B, Covid) - Nasopharyngeal Swab     Status: None   Collection Time: 02/28/20 12:29 PM   Specimen: Nasopharyngeal Swab  Result Value Ref Range Status   SARS Coronavirus 2 by RT PCR NEGATIVE NEGATIVE Final    Comment: (NOTE) SARS-CoV-2 target nucleic acids are NOT  DETECTED.  The SARS-CoV-2 RNA is generally detectable in upper respiratoy specimens during the acute phase of infection. The lowest concentration of SARS-CoV-2 viral copies this assay can detect is 131 copies/mL. A negative result does not preclude SARS-Cov-2 infection and should not be used as the sole basis for treatment or other patient management decisions. A negative result may occur with  improper specimen collection/handling, submission of specimen other than nasopharyngeal swab, presence of viral mutation(s) within the areas targeted by this assay, and inadequate number of viral copies (<131 copies/mL). A negative result must be combined with clinical observations, patient history, and epidemiological information. The expected result is Negative.  Fact Sheet for Patients:  PinkCheek.be  Fact Sheet for Healthcare Providers:  GravelBags.it  This test is no t yet approved or cleared by the [place] FDA and  has been authorized for detection and/or diagnosis of SARS-CoV-2 by FDA under an Emergency Use Authorization (EUA). This EUA will remain  in effect (meaning this test can be used) for the duration of the COVID-19 declaration under Section 564(b)(1) of the Act, 21 U.S.C. section 360bbb-3(b)(1), unless the authorization is terminated or revoked sooner.     Influenza A by PCR NEGATIVE NEGATIVE Final   Influenza B by PCR NEGATIVE NEGATIVE Final    Comment: (NOTE) The Xpert Xpress SARS-CoV-2/FLU/RSV assay is intended as an aid in  the diagnosis of influenza from Nasopharyngeal swab specimens and  should not be used as a sole basis for treatment. Nasal washings and  aspirates are unacceptable for Xpert Xpress SARS-CoV-2/FLU/RSV  testing.  Fact Sheet for Patients: PinkCheek.be  Fact Sheet for Healthcare Providers: GravelBags.it  This test is not yet  approved or cleared by the [place] and  has been authorized for detection and/or diagnosis of SARS-CoV-2 by  FDA under an Emergency Use Authorization (EUA). This EUA will remain  in effect (meaning this test can be used) for the duration of the  Covid-19 declaration under Section 564(b)(1) of the Act, 21  U.S.C. section 360bbb-3(b)(1), unless the authorization is  terminated or revoked. Performed at [facility], [address] [place], [place] [address]   Surgical pcr screen     Status: None   Collection Time: 03/02/20  8:59 AM   Specimen: Nasal Mucosa; Nasal Swab  Result Value Ref Range Status   MRSA, PCR NEGATIVE NEGATIVE Final   Staphylococcus aureus NEGATIVE NEGATIVE Final    Comment: (NOTE) The Xpert SA Assay (FDA approved for NASAL specimens in patients 61 years of age and older), is one component of a comprehensive surveillance program. It is not intended to diagnose infection nor to guide or monitor treatment. Performed at [facility], [place] [address], [place], [place] [address]   Aerobic/Anaerobic Culture (surgical/deep wound)     Status: None (Preliminary result)   Collection Time: 03/02/20  3:37 PM   Specimen: Soft Tissue, Other  Result Value Ref Range Status   Specimen Description TISSUE LEFT FOOT  Final   Special Requests PT ON ROCEPHIN  Final   Gram Stain   Final    NO WBC SEEN ABUNDANT GRAM POSITIVE COCCI RARE GRAM NEGATIVE RODS    Culture   Final    TOO YOUNG TO READ Performed at [facility], [address], [place], [place] [address]    Report Status PENDING  Incomplete  Aerobic/Anaerobic Culture (surgical/deep wound)     Status: None (Preliminary result)   Collection Time: 03/02/20  3:41 PM   Specimen: Soft Tissue, Other  Result Value Ref Range Status   Specimen Description TISSUE LEFT FOOT  Final   Special Requests SAMPLE B DEEP TISSUE PT ON ROCEPHIN  Final   Gram Stain NO WBC SEEN RARE GRAM POSITIVE COCCI   Final    Culture   Final    NO GROWTH < 24 HOURS Performed at [facility], [place] [address], [place], [place] [address]    Report Status PENDING  Incomplete         Radiology Studies: DG Abd 1 View  Result Date: 03/02/2020 CLINICAL DATA:  Short of breath when lying down as well as abdominal pain today. EXAM: ABDOMEN - 1 VIEW COMPARISON:  None. FINDINGS: There is no bowel dilation to suggest obstruction. Soft tissues and skeletal structures are unremarkable. IMPRESSION: Negative. Electronically Signed   By: Lajean Manes M.D.   On: 03/02/2020 11:37   DG CHEST PORT 1 VIEW  Result Date: 03/02/2020 CLINICAL DATA:  Short of breath when lying down as well as abdominal pain today. EXAM: PORTABLE CHEST 1 VIEW COMPARISON:  02/28/2020 and earlier exams. FINDINGS: Cardiac silhouette normal in size.  No mediastinal or hilar masses. Clear lungs.  No convincing pleural effusion and no pneumothorax. Skeletal structures are grossly intact. IMPRESSION: No acute cardiopulmonary disease. Electronically Signed   By: Lajean Manes M.D.   On: 03/02/2020 11:36   DG Foot Complete Left  Result Date: 03/02/2020 CLINICAL DATA:  Open wound left fifth metatarsal EXAM: LEFT FOOT - COMPLETE 3+ VIEW COMPARISON:  02/28/2020 FINDINGS: Loss of cortical definition of the fifth metatarsal head with focal osteopenia.  The base of the fifth toe proximal phalanx also demonstrates relative demineralization. There is a fracture through the base of the fifth toe proximal phalanx. No dislocation. Extensive soft tissue swelling and ulceration is again noted overlying the fifth MTP joint. Soft tissue gas is seen throughout the fifth toe tracking proximally to the level of the mid fifth metatarsal diaphysis. IMPRESSION: 1. Acute osteomyelitis of the fifth metatarsal head and base of the fifth toe proximal phalanx. 2. Fracture through the base of the fifth toe proximal phalanx. 3. Soft tissue gas throughout the fifth toe tracking proximally  to the level of the mid fifth metatarsal diaphysis which may be in part related to patient's open wound, although findings remain concerning for gas-forming infection. No appreciable progression in the degree of soft tissue gas compared to the previous study. Electronically Signed   By: Davina Poke D.O.   On: 03/02/2020 08:30        Scheduled Meds: . docusate sodium  100 mg Oral BID  . heparin  5,000 Units Subcutaneous Q8H  . insulin aspart  0-5 Units Subcutaneous QHS  . insulin aspart  0-9 Units Subcutaneous TID WC  . multivitamin with minerals  1 tablet Oral Daily  . pantoprazole  40 mg Oral Daily  . Ensure Max Protein  11 oz Oral TID   Continuous Infusions: . sodium chloride    . cefTRIAXone (ROCEPHIN)  IV 2 g (03/02/20 2242)  . DAPTOmycin (CUBICIN)  IV 650 mg (03/02/20 2010)  . lactated ringers       LOS: 4 days    Time spent: 35 minutes.     Elmarie Shiley, MD [facility]   If 7PM-7AM, please contact night-coverage [email or web address]  03/03/2020, 1:03 PM

## 2020-03-03 NOTE — Progress Notes (Signed)
Patient ID: Ronnie Shaw, male   DOB: [birth year], 39 y.o.   MRN: [id number] Patient is postoperative day 1 extensive debridement of the left foot with fourth and fifth ray amputations with a large soft tissue abscess with necrotic tissue the first cultures are showing gram-positive cocci and gram-negative rods the deep cultures are showing gram-positive cocci.  Patient's calf is soft no evidence of infection within the left calf however there is swelling.  Patient's creatinine is slightly improved at 2.33.  His white blood cell count is increased to 19.6 and his hemoglobin has improved to 8.2 after transfusion of 1 unit packed red blood cells.  The installation wound VAC is functioning well.  I discussed with the patient I am concerned for the possibility of necrotizing fasciitis continue his broad-spectrum IV antibiotics.  I have discussed with the patient we will proceed with further debridement on Friday and there is a potential for transtibial amputation however that decision would be made after debridement on Friday.

## 2020-03-03 NOTE — Progress Notes (Signed)
  Echocardiogram 2D Echocardiogram has been performed.  Ronnie Shaw 03/03/2020, 2:32 PM
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
## 2020-03-03 NOTE — Progress Notes (Signed)
Attempted lower extremity venous duplex, however patient is using the bathroom. Will attempt again as schedule permits.  03/03/2020 4:50 PM Eula Fried., MHA, RVT, RDCS, RDMS

## 2020-03-03 NOTE — Progress Notes (Signed)
Pharmacy Antibiotic Note  Ronnie Shaw is a 39 y.o. male with history of prior R-TMA who presented on 02/28/2020 with L-DFI/abscess, ortho now planning toe amputations. Pharmacy has been consulted for Cefepime and to transition Vancomycin to Daptomycin with AKI.  The patient is noted to be in AKI (BL 1.2-1.6, admit 3.58) - SCr down to 2.33, nCrCl~40-45 ml/min. Current Daptomycin dosing remains appropriate. Dapto approved by ID ([place]), using adjusted body weight due to BMI 44  CK noted to be elevated at 979 on 11/15 at baseline prior to Daptomycin, likely related to DFI/abscess. Will plan to recheck tomorrow to see if downtrending now that patient is s/p surgery. Will also follow-up on tissue cultures sent intra-op.   Plan: - Continue Daptomycin 650 mg (~6 mg/kg AdjBW) every 24 hours - first dose this evening at 2000 - Recheck CK with 11/19 AM labs - Continue Ceftriaxone 2g IV every 24 hours - Will continue to follow renal function, culture results, LOT, and antibiotic de-escalation plans   Height: 6\' 1"  (185.4 cm) Weight: (!) 151.3 kg (333 lb 9.6 oz) IBW/kg (Calculated) : 79.9  Temp (24hrs), Avg:98.8 F (37.1 C), Min:97.2 F (36.2 C), Max:100.3 F (37.9 C)  Recent Labs  Lab 02/28/20 1140 02/28/20 1540 02/29/20 0714 03/01/20 0341 03/02/20 0048 03/03/20 0127  WBC 16.2*  --  15.3* 14.5* 15.9* 19.6*  CREATININE 3.58*  --  3.18* 2.70* 2.35* 2.33*  LATICACIDVEN 1.5 1.6  --   --   --   --     Estimated Creatinine Clearance: 66.6 mL/min (A) (by C-G formula based on SCr of 2.33 mg/dL (H)).    Allergies  Allergen Reactions  . Prednisone Other (See Comments)    "makes me go blind", "that's how I got cataracts".    Antimicrobials this admission: Vanc 11/14>> 11/15 CTX x 1 11/14; restart 11/15 Flagyl po 11/14>> 11/16 Cefepime 11/14>> 11/15 Dapto 11/15 >>  Dose adjustments this admission: N/A  Microbiology results: 11/14 Fluvid >> neg 11/14 BCx >> 1 of 4 Group C Strep  (pan-S except R-clinda/erythromycin) 11/17 L-foot tissue cx (sample A) >> abundant GPC + rare GNR on GS >> 11/17 L-foot tissue cx (sample B) >> rare GPC on GS  Thank you for allowing pharmacy to be a part of this patient's care.  12/17, PharmD, BCPS Clinical Pharmacist Clinical phone for 03/03/2020: 03/05/2020 03/03/2020 8:37 AM   **Pharmacist phone directory can now be found on [email or web address] ([email or web address]).  Listed under [facility].

## 2020-03-03 NOTE — Consult Note (Signed)
WOC Nurse Consult Note: Patient receiving care in [facility] [phone]. Reason for Consult: right foot amputation site residual wound Wound type: healing surgical amputation site Pressure Injury POA: Yes/No/NA Measurement: 0.8 cm x 3 cm Wound bed: pink Drainage (amount, consistency, odor) none Periwound: flaking Dressing procedure/placement/frequency: Cleanse right foot amputation site with soap and water. Pat dry. Place a foam dressing over the wound. You may need to secure it in place with a few turns of kerlex. Change every other day and prn.  Care performed today at the time of my visit. Monitor the wound area(s) for worsening of condition such as: Signs/symptoms of infection,  Increase in size,  Development of or worsening of odor, Development of pain, or increased pain at the affected locations.  Notify the medical team if any of these develop.  Thank you for the consult.  Discussed plan of care with the patient.  WOC nurse will not follow at this time.  Please re-consult the WOC team if needed.  Helmut Muster, RN, MSN, CWOCN, CNS-BC, pager [phone]

## 2020-03-03 NOTE — Progress Notes (Signed)
Inpatient Rehab Admissions Coordinator Note:   Per therapy recommendations, pt was screened for CIR candidacy by Estill Dooms, PT, DPT.  At this time note pt min guard to supervision for assessed mobility and possibly planned for BKA.  Will not request an order at this time, but will follow from a distance to see if pt requires further medical intervention and, if so, what his post-op therapy needs are.  Please contact me with questions.   Estill Dooms, PT, DPT [phone] 03/03/20 12:58 PM
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
## 2020-03-03 NOTE — H&P (View-Only) (Signed)
Patient ID: Ronnie Shaw, male   DOB: 05/03/1980, 39 y.o.   MRN: [id number] Patient is postoperative day 1 extensive debridement of the left foot with fourth and fifth ray amputations with a large soft tissue abscess with necrotic tissue the first cultures are showing gram-positive cocci and gram-negative rods the deep cultures are showing gram-positive cocci.  Patient's calf is soft no evidence of infection within the left calf however there is swelling.  Patient's creatinine is slightly improved at 2.33.  His white blood cell count is increased to 19.6 and his hemoglobin has improved to 8.2 after transfusion of 1 unit packed red blood cells.  The installation wound VAC is functioning well.  I discussed with the patient I am concerned for the possibility of necrotizing fasciitis continue his broad-spectrum IV antibiotics.  I have discussed with the patient we will proceed with further debridement on Friday and there is a potential for transtibial amputation however that decision would be made after debridement on Friday. 

## 2020-03-03 NOTE — Anesthesia Postprocedure Evaluation (Signed)
Anesthesia Post Note  Patient: Ronnie Shaw  Procedure(s) Performed: LEFT FOOT FOURTH AND FIFTH RAY AMPUTATION (Left Foot)     Patient location during evaluation: PACU Anesthesia Type: General Level of consciousness: sedated and patient cooperative Pain management: pain level controlled Vital Signs Assessment: post-procedure vital signs reviewed and stable Respiratory status: spontaneous breathing Cardiovascular status: stable Anesthetic complications: no   No complications documented.  Last Vitals:  Vitals:   03/03/20 1743 03/03/20 2015  BP: (!) 150/92 (!) 162/99  Pulse: 80 83  Resp: 18 18  Temp: 36.7 C 36.8 C  SpO2: 100% 97%    Last Pain:  Vitals:   03/03/20 2015  TempSrc: Oral  PainSc:                  [name]

## 2020-03-04 ENCOUNTER — Encounter (HOSPITAL_COMMUNITY)
Admission: EM | Disposition: A | Discharge status: Rehabilitation Facility | Payer: Self-pay | Source: Home / Self Care | Attending: Internal Medicine

## 2020-03-04 ENCOUNTER — Other Ambulatory Visit: Payer: Self-pay | Admitting: Physician Assistant

## 2020-03-04 ENCOUNTER — Inpatient Hospital Stay (HOSPITAL_COMMUNITY): Payer: Self-pay | Admitting: Certified Registered"

## 2020-03-04 ENCOUNTER — Encounter (HOSPITAL_COMMUNITY): Payer: Self-pay | Admitting: Internal Medicine

## 2020-03-04 ENCOUNTER — Encounter (HOSPITAL_COMMUNITY): Payer: Self-pay

## 2020-03-04 DIAGNOSIS — M86172 Other acute osteomyelitis, left ankle and foot: Secondary | ICD-10-CM

## 2020-03-04 HISTORY — PX: I & D EXTREMITY: SHX5045

## 2020-03-04 LAB — BASIC METABOLIC PANEL
Anion gap: 12 (ref 5–15)
BUN: 60 mg/dL — ABNORMAL HIGH (ref 6–20)
CO2: 19 mmol/L — ABNORMAL LOW (ref 22–32)
Calcium: 8.6 mg/dL — ABNORMAL LOW (ref 8.9–10.3)
Chloride: 104 mmol/L (ref 98–111)
Creatinine, Ser: 1.83 mg/dL — ABNORMAL HIGH (ref 0.61–1.24)
GFR, Estimated: 48 mL/min — ABNORMAL LOW (ref >60)
Glucose, Bld: 160 mg/dL — ABNORMAL HIGH (ref 70–99)
Potassium: 4.5 mmol/L (ref 3.5–5.1)
Sodium: 135 mmol/L (ref 135–145)

## 2020-03-04 LAB — CBC
HCT: 25.8 % — ABNORMAL LOW (ref 39.0–52.0)
Hemoglobin: 8.1 g/dL — ABNORMAL LOW (ref 13.0–17.0)
MCH: 27.3 pg (ref 26.0–34.0)
MCHC: 31.4 g/dL (ref 30.0–36.0)
MCV: 86.9 fL (ref 80.0–100.0)
Platelets: 416 10*3/uL — ABNORMAL HIGH (ref 150–400)
RBC: 2.97 MIL/uL — ABNORMAL LOW (ref 4.22–5.81)
RDW: 16 % — ABNORMAL HIGH (ref 11.5–15.5)
WBC: 17.7 10*3/uL — ABNORMAL HIGH (ref 4.0–10.5)
nRBC: 0 % (ref 0.0–0.2)

## 2020-03-04 LAB — GLUCOSE, CAPILLARY
Glucose-Capillary: 124 mg/dL — ABNORMAL HIGH (ref 70–99)
Glucose-Capillary: 168 mg/dL — ABNORMAL HIGH (ref 70–99)
Glucose-Capillary: 120 mg/dL — ABNORMAL HIGH (ref 70–99)
Glucose-Capillary: 145 mg/dL — ABNORMAL HIGH (ref 70–99)
Glucose-Capillary: 173 mg/dL — ABNORMAL HIGH (ref 70–99)

## 2020-03-04 LAB — CULTURE, BLOOD (ROUTINE X 2)
Culture: NO GROWTH
Special Requests: ADEQUATE

## 2020-03-04 LAB — CK: Total CK: 273 U/L (ref 49–397)

## 2020-03-04 SURGERY — IRRIGATION AND DEBRIDEMENT EXTREMITY
Anesthesia: General | Laterality: Left

## 2020-03-04 MED ORDER — ONDANSETRON HCL 4 MG/2ML IJ SOLN
INTRAMUSCULAR | Status: DC | PRN
  Administered 2020-03-04: 4 mg via INTRAVENOUS

## 2020-03-04 MED ORDER — ONDANSETRON HCL 4 MG/2ML IJ SOLN: 4.0000 mg | Freq: Once | INTRAMUSCULAR | Status: DC | PRN

## 2020-03-04 MED ORDER — CHLORHEXIDINE GLUCONATE 0.12 % MT SOLN
15.0000 mL | OROMUCOSAL | Status: AC
  Filled 2020-03-04 (×2): qty 15

## 2020-03-04 MED ORDER — 0.9 % SODIUM CHLORIDE (POUR BTL) OPTIME
TOPICAL | Status: DC | PRN
  Administered 2020-03-04: 1000 mL

## 2020-03-04 MED ORDER — ACETAMINOPHEN 160 MG/5ML PO SOLN: 325.0000 mg | ORAL | Status: DC | PRN

## 2020-03-04 MED ORDER — PROPOFOL 10 MG/ML IV BOLUS
INTRAVENOUS | Status: DC | PRN
  Administered 2020-03-04: 200 mg via INTRAVENOUS

## 2020-03-04 MED ORDER — ENSURE PRE-SURGERY PO LIQD
296.0000 mL | Freq: Once | ORAL | Status: AC
  Administered 2020-03-04: 296 mL via ORAL
  Filled 2020-03-04: qty 296

## 2020-03-04 MED ORDER — SODIUM CHLORIDE 0.9 % IV SOLN: INTRAVENOUS | Status: DC

## 2020-03-04 MED ORDER — CHLORHEXIDINE GLUCONATE 0.12 % MT SOLN
15.0000 mL | OROMUCOSAL | Status: AC
  Administered 2020-03-04: 15 mL via OROMUCOSAL
  Filled 2020-03-04: qty 15

## 2020-03-04 MED ORDER — LACTATED RINGERS IV SOLN: INTRAVENOUS | Status: DC

## 2020-03-04 MED ORDER — LIDOCAINE 2% (20 MG/ML) 5 ML SYRINGE
INTRAMUSCULAR | Status: DC | PRN
  Administered 2020-03-04: 40 mg via INTRAVENOUS

## 2020-03-04 MED ORDER — MIDAZOLAM HCL 2 MG/2ML IJ SOLN
INTRAMUSCULAR | Status: AC
  Filled 2020-03-04: qty 2

## 2020-03-04 MED ORDER — FENTANYL CITRATE (PF) 250 MCG/5ML IJ SOLN
INTRAMUSCULAR | Status: AC
  Filled 2020-03-04: qty 5

## 2020-03-04 MED ORDER — PROPOFOL 10 MG/ML IV BOLUS
INTRAVENOUS | Status: AC
  Filled 2020-03-04: qty 20

## 2020-03-04 MED ORDER — FENTANYL CITRATE (PF) 100 MCG/2ML IJ SOLN
INTRAMUSCULAR | Status: DC | PRN
  Administered 2020-03-04: 50 ug via INTRAVENOUS

## 2020-03-04 MED ORDER — PIPERACILLIN-TAZOBACTAM 3.375 G IVPB
3.3750 g | Freq: Three times a day (TID) | INTRAVENOUS | Status: DC
  Administered 2020-03-04 – 2020-03-11 (×20): 3.375 g via INTRAVENOUS
  Filled 2020-03-04 (×22): qty 50

## 2020-03-04 MED ORDER — ACETAMINOPHEN 325 MG PO TABS: 325.0000 mg | ORAL_TABLET | ORAL | Status: DC | PRN

## 2020-03-04 MED ORDER — MIDAZOLAM HCL 5 MG/5ML IJ SOLN
INTRAMUSCULAR | Status: DC | PRN
  Administered 2020-03-04: 2 mg via INTRAVENOUS

## 2020-03-04 MED ORDER — HYDROMORPHONE HCL 1 MG/ML IJ SOLN: 0.2500 mg | INTRAMUSCULAR | Status: DC | PRN

## 2020-03-04 SURGICAL SUPPLY — 35 items
BLADE SURG 21 STRL SS (BLADE) ×2 IMPLANT
BNDG COHESIVE 6X5 TAN STRL LF (GAUZE/BANDAGES/DRESSINGS) ×1 IMPLANT
BNDG GAUZE ELAST 4 BULKY (GAUZE/BANDAGES/DRESSINGS) ×4 IMPLANT
CASSETTE VERAFLO VERALINK (MISCELLANEOUS) ×1 IMPLANT
COVER SURGICAL LIGHT HANDLE (MISCELLANEOUS) ×4 IMPLANT
COVER WAND RF STERILE (DRAPES) IMPLANT
DRAPE DERMATAC (DRAPES) ×2 IMPLANT
DRAPE U-SHAPE 47X51 STRL (DRAPES) ×2 IMPLANT
DRESSING VERAFLO CLEANSE CC (GAUZE/BANDAGES/DRESSINGS) IMPLANT
DRSG ADAPTIC 3X8 NADH LF (GAUZE/BANDAGES/DRESSINGS) ×2 IMPLANT
DRSG VERAFLO CLEANSE CC (GAUZE/BANDAGES/DRESSINGS) ×2
DURAPREP 26ML APPLICATOR (WOUND CARE) ×2 IMPLANT
ELECT REM PT RETURN 9FT ADLT (ELECTROSURGICAL)
ELECTRODE REM PT RTRN 9FT ADLT (ELECTROSURGICAL) IMPLANT
GAUZE SPONGE 4X4 12PLY STRL (GAUZE/BANDAGES/DRESSINGS) ×2 IMPLANT
GLOVE BIOGEL PI IND STRL 9 (GLOVE) ×1 IMPLANT
GLOVE BIOGEL PI INDICATOR 9 (GLOVE) ×1
GLOVE SURG ORTHO 9.0 STRL STRW (GLOVE) ×2 IMPLANT
GOWN STRL REUS W/ TWL XL LVL3 (GOWN DISPOSABLE) ×2 IMPLANT
GOWN STRL REUS W/TWL XL LVL3 (GOWN DISPOSABLE) ×4
HANDPIECE INTERPULSE COAX TIP (DISPOSABLE)
KIT BASIN OR (CUSTOM PROCEDURE TRAY) ×2 IMPLANT
KIT TURNOVER KIT B (KITS) ×2 IMPLANT
MANIFOLD NEPTUNE II (INSTRUMENTS) ×2 IMPLANT
NS IRRIG 1000ML POUR BTL (IV SOLUTION) ×2 IMPLANT
PACK ORTHO EXTREMITY (CUSTOM PROCEDURE TRAY) ×2 IMPLANT
PAD ARMBOARD 7.5X6 YLW CONV (MISCELLANEOUS) ×4 IMPLANT
SET HNDPC FAN SPRY TIP SCT (DISPOSABLE) IMPLANT
STOCKINETTE IMPERVIOUS 9X36 MD (GAUZE/BANDAGES/DRESSINGS) IMPLANT
SUT ETHILON 2 0 PSLX (SUTURE) ×2 IMPLANT
SWAB COLLECTION DEVICE MRSA (MISCELLANEOUS) ×2 IMPLANT
SWAB CULTURE ESWAB REG 1ML (MISCELLANEOUS) IMPLANT
TOWEL GREEN STERILE (TOWEL DISPOSABLE) ×2 IMPLANT
TUBE CONNECTING 12X1/4 (SUCTIONS) ×2 IMPLANT
YANKAUER SUCT BULB TIP NO VENT (SUCTIONS) ×2 IMPLANT

## 2020-03-04 NOTE — Progress Notes (Signed)
PROGRESS NOTE    Ronnie Shaw  JJH:[id number] DOB: 25-Apr-1980 DOA: 02/28/2020 PCP: Gildardo Pounds, NP   Brief Narrative: 39 year old with past medical history significant for obesity, type 2 diabetes, diabetic foot ulcer with right foot transmetatarsal amputation in 2020 presents to the ED with fevers, chills, nausea, vomiting and cough and worsening pain in his left foot. In the emergency room he was febrile, met sepsis criteria, work-up was concerning for necrotic wound on his left foot and acute kidney injury creatinine of 3.5. -Admitted with sepsis, necrotic diabetic foot ulcer and acute kidney injury.    Assessment & Plan:   Principal Problem:   Severe sepsis with acute organ dysfunction (HCC) Active Problems:   Hyponatremia   Diabetic polyneuropathy associated with type 2 diabetes mellitus (HCC)   Acute kidney failure (HCC)   Diabetic ulcer of left foot (HCC)   Infection of left foot   Necrotizing fasciitis ([place])   Subacute osteomyelitis, left ankle and foot ([place])   1-Sepsis: POA Secondary  Gangrene left foot with osteomyelitis fifth and fourth metatarsal with necrotizing fasciitis and abscess. Group C strep bacteremia. -Continue with ceftriaxone and daptomycin. (Vancomycin was discontinued due to severe AKI) -Blood cultures grew with group B strep. -Dr. Sharol Given was consulted.  He recommended foot salvage intervention with fourth and fifth ray amputation.  Patient had a second opinion by Dr. Ginette Pitman who also recommended surgery. -ABIs normal. -Patient underwent left foot fourth and fifth ray amputation, extensive excisional debridement of the skin and soft tissue muscle and fascia application of wound VAC on 11/17. -Appreciate ID evaluation. -ECHO no evidence of vegetation.  -Patient will require another I&D on 11/19. -Lower extremity ultrasound due to leg edema, pending.   2-AKI: Creatinine baseline 1.6 from 7/21st.  Patient presented with a creatinine at  3.5 Suspected to be ATN from sepsis, creatinine trending down. -Creatinine  Down to 1.8 improving, close to baseline.  -He received IV fluids.   3-Obesity, type 2 diabetes: BMI 44, weight loss was encouraged Hemoglobin A1c 6.4.  Continue with a sliding scale insulin  4-Abnormal chest x-ray, right upper lobe infiltrate Continue with incentive spirometry, with flutter valve. Reported some cough and shortness of breath.  Chest x-ray repeated today negative  5-Social;  Does not have a PCP, care management has arranged follow-up with wellness clinic.  6-Abdominal pain: Resolved.  Abdominal exam benign.  No rigidity. KUB was negative for obstruction Had BM. Hold laxatives report diarrhea.  Continue with PPI  7-Hyponatremia;  Resolved.   8-Anemia;  Hb range 9--7.9 for last 3 days.  Hb at 9 (11 months ago).  Received one unit PRBC 11/17 anemia panel; iron deficiency anemia.  Plan to start oral iron when infection sources controlled Hb stable.     Nutrition Problem: Increased nutrient needs Etiology: wound healing    Signs/Symptoms: estimated needs    Interventions: Premier Protein, Juven  Estimated body mass index is 48.92 kg/m as calculated from the following:   Height as of this encounter: _0  (1.854 m).   Weight as of this encounter: 168.2 kg.   DVT prophylaxis: Heparin Code Status: Full code Family Communication: Care discussed with patient Disposition Plan:  Status is: Inpatient  Remains inpatient appropriate because:IV treatments appropriate due to intensity of illness or inability to take PO   Dispo: The patient is from: Home              Anticipated d/c is to: To be determined  Anticipated d/c date is: 3 days              Patient currently is not medically stable to d/c.        Consultants:   Dr. Sharol Given  Procedures:  ABI negative Antimicrobials:    Subjective: He denies dyspnea. Denies abdominal pain.  Had diarrhea. Would  like to hold laxatives  Objective: Vitals:   03/03/20 1743 03/03/20 2015 03/04/20 0418 03/04/20 1000  BP: (!) 150/92 (!) 162/99 (!) 156/83 (!) 159/96  Pulse: 80 83 86 84  Resp: _0 Temp: 98.1 F (36.7 C) 98.2 F (36.8 C) 98.6 F (37 C) 98.3 F (36.8 C)  TempSrc: Oral Oral Oral Oral  SpO2: 100% 97% 96% 99%  Weight:  (!) 168.2 kg    Height:        Intake/Output Summary (Last 24 hours) at 03/04/2020 1243 Last data filed at 03/04/2020 1100 Gross per 24 hour  Intake 1443 ml  Output 1925 ml  Net -482 ml   Filed Weights   02/28/20 1120 03/02/20 1423 03/03/20 2015  Weight: (!) 151.3 kg (!) 151.3 kg (!) 168.2 kg    Examination:  General exam: NAD Respiratory system: CTA Cardiovascular system: S 1, S 2 RRR Gastrointestinal system: Bs present, soft,  nt Central nervous system: Alert, following command Extremities: BL LE with dressing. Left foot with wound VAC in place, calf tenderness   Data Reviewed: I have personally reviewed following labs and imaging studies  CBC: Recent Labs  Lab 02/28/20 1140 02/28/20 1140 02/29/20 0714 02/29/20 0714 03/01/20 0341 03/02/20 0048 03/02/20 1828 03/03/20 0127 03/04/20 1036  WBC 16.2*   < > 15.3*  --  14.5* 15.9*  --  19.6* 17.7*  NEUTROABS 14.8*  --   --   --   --   --   --   --   --   HGB 9.3*   < > 7.9*   < > 7.9* 7.5* 8.0* 8.2* 8.1*  HCT 28.8*   < > 24.7*   < > 24.9* 23.2* 24.9* 25.7* 25.8*  MCV 84.7   < > 85.5  --  85.6 84.4  --  85.4 86.9  PLT 345   < > 278  --  268 289  --  336 416*   < > = values in this interval not displayed.   Basic Metabolic Panel: Recent Labs  Lab 02/29/20 0714 03/01/20 0341 03/02/20 0048 03/03/20 0127 03/04/20 1036  NA 130* 130* 130* 131* 135  K 4.1 4.4 4.2 5.1 4.5  CL 101 100 101 103 104  CO2 20* 19* 18* 18* 19*  GLUCOSE 130* 159* 150* 209* 160*  BUN 35* 41* 44* 51* 60*  CREATININE 3.18* 2.70* 2.35* 2.33* 1.83*  CALCIUM 8.1* 8.1* 8.1* 8.4* 8.6*   GFR: Estimated Creatinine  Clearance: 90.1 mL/min (A) (by C-G formula based on SCr of 1.83 mg/dL (H)). Liver Function Tests: Recent Labs  Lab 02/28/20 1140 02/29/20 0714  AST 32 39  ALT 29 33  ALKPHOS 89 84  BILITOT 0.6 0.5  PROT 8.3* 7.0  ALBUMIN 2.7* 2.0*   No results for input(s): LIPASE, AMYLASE in the last 168 hours. No results for input(s): AMMONIA in the last 168 hours. Coagulation Profile: Recent Labs  Lab 02/28/20 1140 02/29/20 0714  INR 1.3* 1.4*   Cardiac Enzymes: Recent Labs  Lab 02/29/20 1206 03/04/20 0246  CKTOTAL 979* 273   BNP (last 3 results) No results for input(s):  PROBNP in the last 8760 hours. HbA1C: No results for input(s): HGBA1C in the last 72 hours. CBG: Recent Labs  Lab 03/03/20 1153 03/03/20 1722 03/03/20 2118 03/04/20 0638 03/04/20 1142  GLUCAP 174* 123* 169* 124* 168*   Lipid Profile: No results for input(s): CHOL, HDL, LDLCALC, TRIG, CHOLHDL, LDLDIRECT in the last 72 hours. Thyroid Function Tests: No results for input(s): TSH, T4TOTAL, FREET4, T3FREE, THYROIDAB in the last 72 hours. Anemia Panel: Recent Labs    03/03/20 0127  VITAMINB12 836  FOLATE 14.7  FERRITIN 591*  TIBC 157*  IRON 23*  RETICCTPCT 0.7   Sepsis Labs: Recent Labs  Lab 02/28/20 1140 02/28/20 1540 02/29/20 0714  PROCALCITON  --   --  20.82  LATICACIDVEN 1.5 1.6  --     Recent Results (from the past 240 hour(s))  Culture, blood (Routine x 2)     Status: Abnormal   Collection Time: 02/28/20 11:40 AM   Specimen: BLOOD  Result Value Ref Range Status   Specimen Description   Final    BLOOD RIGHT ANTECUBITAL Performed at [facility], [place] [address], [place], [place] [address]    Special Requests   Final    BOTTLES DRAWN AEROBIC AND ANAEROBIC Blood Culture adequate volume Performed at [facility], [address] [place], [place] [address]    Culture  Setup Time   Final    GRAM POSITIVE COCCI IN CHAINS ANAEROBIC BOTTLE ONLY CRITICAL RESULT CALLED TO,  READ BACK BY AND VERIFIED WITH: Earmon Phoenix [phone] FCP Performed at [facility], [place] [address] [place], [place] [address]    Culture STREPTOCOCCUS GROUP C (A)  Final   Report Status 03/02/2020 FINAL  Final   Organism ID, Bacteria STREPTOCOCCUS GROUP C  Final      Susceptibility   Streptococcus group c - MIC*    CLINDAMYCIN >=1 RESISTANT Resistant     AMPICILLIN <=0.25 SENSITIVE Sensitive     ERYTHROMYCIN >=8 RESISTANT Resistant     VANCOMYCIN 0.5 SENSITIVE Sensitive     CEFTRIAXONE 0.25 SENSITIVE Sensitive     LEVOFLOXACIN <=0.25 SENSITIVE Sensitive     PENICILLIN Value in next row Sensitive      SENSITIVE<=0.06    * STREPTOCOCCUS GROUP C  Blood Culture ID Panel (Reflexed)     Status: Abnormal   Collection Time: 02/28/20 11:40 AM  Result Value Ref Range Status   Enterococcus faecalis NOT DETECTED NOT DETECTED Final   Enterococcus Faecium NOT DETECTED NOT DETECTED Final   Listeria monocytogenes NOT DETECTED NOT DETECTED Final   Staphylococcus species NOT DETECTED NOT DETECTED Final   Staphylococcus aureus (BCID) NOT DETECTED NOT DETECTED Final   Staphylococcus epidermidis NOT DETECTED NOT DETECTED Final   Staphylococcus lugdunensis NOT DETECTED NOT DETECTED Final   Streptococcus species DETECTED (A) NOT DETECTED Final    Comment: Not Enterococcus species, Streptococcus agalactiae, Streptococcus pyogenes, or Streptococcus pneumoniae. CRITICAL RESULT CALLED TO, READ BACK BY AND VERIFIED WITH: PHARMD H. VONDOHLEN [phone] FCP    Streptococcus agalactiae NOT DETECTED NOT DETECTED Final   Streptococcus pneumoniae NOT DETECTED NOT DETECTED Final   Streptococcus pyogenes NOT DETECTED NOT DETECTED Final   A.calcoaceticus-baumannii NOT DETECTED NOT DETECTED Final   Bacteroides fragilis NOT DETECTED NOT DETECTED Final   Enterobacterales NOT DETECTED NOT DETECTED Final   Enterobacter cloacae complex NOT DETECTED NOT DETECTED Final   Escherichia coli NOT DETECTED  NOT DETECTED Final   Klebsiella aerogenes NOT DETECTED  NOT DETECTED Final   Klebsiella oxytoca NOT DETECTED NOT DETECTED Final   Klebsiella pneumoniae NOT DETECTED NOT DETECTED Final   Proteus species NOT DETECTED NOT DETECTED Final   Salmonella species NOT DETECTED NOT DETECTED Final   Serratia marcescens NOT DETECTED NOT DETECTED Final   Haemophilus influenzae NOT DETECTED NOT DETECTED Final   Neisseria meningitidis NOT DETECTED NOT DETECTED Final   Pseudomonas aeruginosa NOT DETECTED NOT DETECTED Final   Stenotrophomonas maltophilia NOT DETECTED NOT DETECTED Final   Candida albicans NOT DETECTED NOT DETECTED Final   Candida auris NOT DETECTED NOT DETECTED Final   Candida glabrata NOT DETECTED NOT DETECTED Final   Candida krusei NOT DETECTED NOT DETECTED Final   Candida parapsilosis NOT DETECTED NOT DETECTED Final   Candida tropicalis NOT DETECTED NOT DETECTED Final   Cryptococcus neoformans/gattii NOT DETECTED NOT DETECTED Final    Comment: Performed at [facility], [place] [address], [place], [place] [address]  Culture, blood (Routine x 2)     Status: None (Preliminary result)   Collection Time: 02/28/20 12:20 PM   Specimen: BLOOD LEFT HAND  Result Value Ref Range Status   Specimen Description   Final    BLOOD LEFT HAND Performed at [facility], [address] [place], [place] [address]    Special Requests   Final    BOTTLES DRAWN AEROBIC AND ANAEROBIC Blood Culture adequate volume Performed at [facility], [address] [place], [place] [address]    Culture   Final    NO GROWTH 4 DAYS Performed at [facility], [place] [address], [place], [place] [address]    Report Status PENDING  Incomplete  Respiratory Panel by RT PCR (Flu A&B, Covid) - Nasopharyngeal Swab     Status: None   Collection Time: 02/28/20 12:29 PM   Specimen: Nasopharyngeal Swab  Result Value Ref Range Status   SARS Coronavirus 2 by RT PCR NEGATIVE NEGATIVE Final     Comment: (NOTE) SARS-CoV-2 target nucleic acids are NOT DETECTED.  The SARS-CoV-2 RNA is generally detectable in upper respiratoy specimens during the acute phase of infection. The lowest concentration of SARS-CoV-2 viral copies this assay can detect is 131 copies/mL. A negative result does not preclude SARS-Cov-2 infection and should not be used as the sole basis for treatment or other patient management decisions. A negative result may occur with  improper specimen collection/handling, submission of specimen other than nasopharyngeal swab, presence of viral mutation(s) within the areas targeted by this assay, and inadequate number of viral copies (<131 copies/mL). A negative result must be combined with clinical observations, patient history, and epidemiological information. The expected result is Negative.  Fact Sheet for Patients:  PinkCheek.be  Fact Sheet for Healthcare Providers:  GravelBags.it  This test is no t yet approved or cleared by the [place] FDA and  has been authorized for detection and/or diagnosis of SARS-CoV-2 by FDA under an Emergency Use Authorization (EUA). This EUA will remain  in effect (meaning this test can be used) for the duration of the COVID-19 declaration under Section 564(b)(1) of the Act, 21 U.S.C. section 360bbb-3(b)(1), unless the authorization is terminated or revoked sooner.     Influenza A by PCR NEGATIVE NEGATIVE Final   Influenza B by PCR NEGATIVE NEGATIVE Final    Comment: (NOTE) The Xpert Xpress SARS-CoV-2/FLU/RSV assay is intended as an aid in  the diagnosis of influenza from Nasopharyngeal swab specimens and  should not be  used as a sole basis for treatment. Nasal washings and  aspirates are unacceptable for Xpert Xpress SARS-CoV-2/FLU/RSV  testing.  Fact Sheet for Patients: PinkCheek.be  Fact Sheet for Healthcare  Providers: GravelBags.it  This test is not yet approved or cleared by the [place] FDA and  has been authorized for detection and/or diagnosis of SARS-CoV-2 by  FDA under an Emergency Use Authorization (EUA). This EUA will remain  in effect (meaning this test can be used) for the duration of the  Covid-19 declaration under Section 564(b)(1) of the Act, 21  U.S.C. section 360bbb-3(b)(1), unless the authorization is  terminated or revoked. Performed at [facility], [address] [place], [place] [address]   Surgical pcr screen     Status: None   Collection Time: 03/02/20  8:59 AM   Specimen: Nasal Mucosa; Nasal Swab  Result Value Ref Range Status   MRSA, PCR NEGATIVE NEGATIVE Final   Staphylococcus aureus NEGATIVE NEGATIVE Final    Comment: (NOTE) The Xpert SA Assay (FDA approved for NASAL specimens in patients 70 years of age and older), is one component of a comprehensive surveillance program. It is not intended to diagnose infection nor to guide or monitor treatment. Performed at [facility], [place] [address] [place], [place] [address]   Aerobic/Anaerobic Culture (surgical/deep wound)     Status: None (Preliminary result)   Collection Time: 03/02/20  3:37 PM   Specimen: Soft Tissue, Other  Result Value Ref Range Status   Specimen Description TISSUE LEFT FOOT  Final   Special Requests PT ON ROCEPHIN  Final   Gram Stain   Final    NO WBC SEEN ABUNDANT GRAM POSITIVE COCCI RARE GRAM NEGATIVE RODS    Culture   Final    FEW GRAM NEGATIVE RODS CULTURE REINCUBATED FOR BETTER GROWTH Performed at [facility], [place] [address], [place], [place] [address]    Report Status PENDING  Incomplete  Aerobic/Anaerobic Culture (surgical/deep wound)     Status: None (Preliminary result)   Collection Time: 03/02/20  3:41 PM   Specimen: Soft Tissue, Other  Result Value Ref Range Status   Specimen Description TISSUE LEFT FOOT  Final    Special Requests SAMPLE B DEEP TISSUE PT ON ROCEPHIN  Final   Gram Stain NO WBC SEEN RARE GRAM POSITIVE COCCI   Final   Culture   Final    CULTURE REINCUBATED FOR BETTER GROWTH Performed at [facility], [place] [address], [place], [place] [address]    Report Status PENDING  Incomplete         Radiology Studies: ECHOCARDIOGRAM COMPLETE  Result Date: 03/03/2020    ECHOCARDIOGRAM REPORT   Patient Name:   LYDELL MOGA Date of Exam: 03/03/2020 Medical Rec #:  [id number]        Height:       73.0 in Accession #:    [id number]       Weight:       333.6 lb Date of Birth:  1980-11-19         BSA:          2.674 m Patient Age:    39 years         BP:           141/89 mmHg Patient Gender: M                HR:           77  bpm. Exam Location:  Inpatient Procedure: 2D Echo, Cardiac Doppler and Color Doppler Indications:    Bacteremia [790.7.ICD-9-CM]  History:        Patient has no prior history of Echocardiogram examinations.                 Risk Factors:Diabetes, Sleep Apnea and Former Smoker.  Sonographer:    Vickie Epley RDCS Referring Phys: [id number] [place]  1. Left ventricular ejection fraction, by estimation, is 55 to 60%. The left ventricle has normal function. The left ventricle has no regional wall motion abnormalities. The left ventricular internal cavity size was mildly dilated. There is moderate left ventricular hypertrophy. Left ventricular diastolic parameters were normal.  2. Right ventricular systolic function is normal. The right ventricular size is mildly enlarged. Tricuspid regurgitation signal is inadequate for assessing PA pressure.  3. The mitral valve is normal in structure. Trivial mitral valve regurgitation.  4. The aortic valve is tricuspid. Aortic valve regurgitation is not visualized. No aortic stenosis is present.  5. Aortic dilatation noted. There is mild dilatation of the ascending aorta, measuring 37 mm.  6. The inferior vena cava is normal in size with  greater than 50% respiratory variability, suggesting right atrial pressure of 3 mmHg. Conclusion(s)/Recommendation(s): No vegetation seen. FINDINGS  Left Ventricle: Left ventricular ejection fraction, by estimation, is 55 to 60%. The left ventricle has normal function. The left ventricle has no regional wall motion abnormalities. The left ventricular internal cavity size was mildly dilated. There is  moderate left ventricular hypertrophy. Left ventricular diastolic parameters were normal. Right Ventricle: The right ventricular size is mildly enlarged. No increase in right ventricular wall thickness. Right ventricular systolic function is normal. Tricuspid regurgitation signal is inadequate for assessing PA pressure. The tricuspid regurgitant velocity is 1.47 m/s, and with an assumed right atrial pressure of 3 mmHg, the estimated right ventricular systolic pressure is 56.3 mmHg. Left Atrium: Left atrial size was normal in size. Right Atrium: Right atrial size was normal in size. Pericardium: Trivial pericardial effusion is present. Mitral Valve: The mitral valve is normal in structure. Trivial mitral valve regurgitation. Tricuspid Valve: The tricuspid valve is normal in structure. Tricuspid valve regurgitation is trivial. Aortic Valve: The aortic valve is tricuspid. Aortic valve regurgitation is not visualized. No aortic stenosis is present. Pulmonic Valve: The pulmonic valve was grossly normal. Pulmonic valve regurgitation is not visualized. Aorta: The aortic root is normal in size and structure and aortic dilatation noted. There is mild dilatation of the ascending aorta, measuring 37 mm. Venous: The inferior vena cava is normal in size with greater than 50% respiratory variability, suggesting right atrial pressure of 3 mmHg. IAS/Shunts: The interatrial septum was not well visualized.  LEFT VENTRICLE PLAX 2D LVIDd:         5.90 cm      Diastology LVIDs:         4.00 cm      LV e' medial:    9.11 cm/s LV PW:          1.10 cm      LV E/e' medial:  10.5 LV IVS:        1.10 cm      LV e' lateral:   11.50 cm/s LVOT diam:     2.70 cm      LV E/e' lateral: 8.3 LV SV:         115 LV SV Index:   43 LVOT Area:     5.73  cm  LV Volumes (MOD) LV vol d, MOD A2C: 167.0 ml LV vol d, MOD A4C: 192.0 ml LV vol s, MOD A2C: 70.5 ml LV vol s, MOD A4C: 93.2 ml LV SV MOD A2C:     96.5 ml LV SV MOD A4C:     192.0 ml LV SV MOD BP:      106.6 ml RIGHT VENTRICLE RV S prime:     14.30 cm/s TAPSE (M-mode): 2.2 cm LEFT ATRIUM             Index       RIGHT ATRIUM           Index LA diam:        4.20 cm 1.57 cm/m  RA Area:     19.80 cm LA Vol (A2C):   56.7 ml 21.20 ml/m RA Volume:   61.70 ml  23.07 ml/m LA Vol (A4C):   31.3 ml 11.70 ml/m LA Biplane Vol: 44.4 ml 16.60 ml/m  AORTIC VALVE LVOT Vmax:   113.00 cm/s LVOT Vmean:  70.800 cm/s LVOT VTI:    0.201 m  AORTA Ao Root diam: 3.90 cm Ao Asc diam:  3.70 cm MITRAL VALVE               TRICUSPID VALVE MV Area (PHT): 4.21 cm    TR Peak grad:   8.6 mmHg MV Decel Time: 180 msec    TR Vmax:        147.00 cm/s MV E velocity: 95.50 cm/s MV A velocity: 64.30 cm/s  SHUNTS MV E/A ratio:  1.49        Systemic VTI:  0.20 m                            Systemic Diam: 2.70 cm Oswaldo Milian MD Electronically signed by Oswaldo Milian MD Signature Date/Time: 03/03/2020/7:59:51 PM    Final         Scheduled Meds: . heparin  5,000 Units Subcutaneous Q8H  . insulin aspart  0-5 Units Subcutaneous QHS  . insulin aspart  0-9 Units Subcutaneous TID WC  . multivitamin with minerals  1 tablet Oral Daily  . pantoprazole  40 mg Oral Daily  . Ensure Max Protein  11 oz Oral TID   Continuous Infusions: . cefTRIAXone (ROCEPHIN)  IV 2 g (03/03/20 2146)  . DAPTOmycin (CUBICIN)  IV 650 mg (03/03/20 2009)  . lactated ringers       LOS: 5 days    Time spent: 35 minutes.     Elmarie Shiley, MD [facility]   If 7PM-7AM, please contact night-coverage [email or web address]  03/04/2020, 12:43 PM

## 2020-03-04 NOTE — Anesthesia Preprocedure Evaluation (Addendum)
Anesthesia Evaluation  Patient identified by MRN, date of birth, ID band Patient awake    Reviewed: Allergy & Precautions, NPO status , Patient's Chart, lab work & pertinent test results  Airway Mallampati: III  TM Distance: >3 FB Neck ROM: Full    Dental   Pulmonary asthma , sleep apnea , pneumonia, Patient abstained from smoking., former smoker,    breath sounds clear to auscultation       Cardiovascular negative cardio ROS   Rhythm:Regular Rate:Normal     Neuro/Psych Seizures -, Well Controlled,  PSYCHIATRIC DISORDERS Depression  Neuromuscular disease    GI/Hepatic negative GI ROS, Neg liver ROS,   Endo/Other  diabetes  Renal/GU Renal InsufficiencyRenal disease     Musculoskeletal   Abdominal (+) + obese,   Peds  Hematology   Anesthesia Other Findings   Reproductive/Obstetrics                            Echo:  1. Left ventricular ejection fraction, by estimation, is 55 to 60%. The  left ventricle has normal function. The left ventricle has no regional  wall motion abnormalities. The left ventricular internal cavity size was  mildly dilated. There is moderate  left ventricular hypertrophy. Left ventricular diastolic parameters were  normal.  2. Right ventricular systolic function is normal. The right ventricular  size is mildly enlarged. Tricuspid regurgitation signal is inadequate for  assessing PA pressure.  3. The mitral valve is normal in structure. Trivial mitral valve  regurgitation.  4. The aortic valve is tricuspid. Aortic valve regurgitation is not  visualized. No aortic stenosis is present.  5. Aortic dilatation noted. There is mild dilatation of the ascending  aorta, measuring 37 mm.  6. The inferior vena cava is normal in size with greater than 50%  respiratory variability, suggesting right atrial pressure of 3 mmHg.   Anesthesia Physical Anesthesia Plan  ASA:  III  Anesthesia Plan: General   Post-op Pain Management:    Induction: Intravenous  PONV Risk Score and Plan: 3 and Ondansetron, Dexamethasone and Midazolam  Airway Management Planned: LMA  Additional Equipment: None  Intra-op Plan:   Post-operative Plan: Extubation in OR  Informed Consent: I have reviewed the patients History and Physical, chart, labs and discussed the procedure including the risks, benefits and alternatives for the proposed anesthesia with the patient or authorized representative who has indicated his/her understanding and acceptance.       Plan Discussed with: CRNA  Anesthesia Plan Comments:        Anesthesia Quick Evaluation

## 2020-03-04 NOTE — Anesthesia Procedure Notes (Signed)
Procedure Name: LMA Insertion Date/Time: 03/04/2020 3:50 PM Performed by: Sheppard Evens, CRNA Pre-anesthesia Checklist: Patient identified, Emergency Drugs available, Suction available and Patient being monitored Patient Re-evaluated:Patient Re-evaluated prior to induction Oxygen Delivery Method: Circle System Utilized Preoxygenation: Pre-oxygenation with 100% oxygen Induction Type: IV induction Ventilation: Mask ventilation without difficulty LMA: LMA inserted LMA Size: 5.0 Number of attempts: 1 Airway Equipment and Method: Bite block Placement Confirmation: positive ETCO2 Tube secured with: Tape Dental Injury: Teeth and Oropharynx as per pre-operative assessment

## 2020-03-04 NOTE — Transfer of Care (Signed)
Immediate Anesthesia Transfer of Care Note  Patient: Ronnie Shaw  Procedure(s) Performed: REPEAT DEBRIDEMENT LEFT FOOT (Left )  Patient Location: PACU  Anesthesia Type:General  Level of Consciousness: awake, alert  and oriented  Airway & Oxygen Therapy: Patient Spontanous Breathing and Patient connected to face mask oxygen  Post-op Assessment: Report given to RN and Post -op Vital signs reviewed and stable  Post vital signs: Reviewed and stable  Last Vitals:  Vitals Value Taken Time  BP    Temp    Pulse    Resp 24 03/04/20 1630  SpO2    Vitals shown include unvalidated device data.  Last Pain:  Vitals:   03/04/20 1000  TempSrc: Oral  PainSc: 0-No pain      Patients Stated Pain Goal: 0 (03/01/20 2224)  Complications: No complications documented.

## 2020-03-04 NOTE — Progress Notes (Signed)
ID Brief Note   OR culture 11/17 now with growth of Pseudomonas/Streptococcus anginosus/Actinomyces ondontolyticus in Day 2 .   Blood cx 02/28/20 Group C streptococci  DC ceftriaxone and start Pip/tazo   Dr Luciana Axe [facility] [phone])  is available this weekend with questions or concerns  Odette Fraction, MD [facility]  [facility]

## 2020-03-04 NOTE — Op Note (Signed)
03/04/2020  4:44 PM  PATIENT:  Ronnie Shaw    PRE-OPERATIVE DIAGNOSIS:  Left Foot Osteomyelitis  POST-OPERATIVE DIAGNOSIS:  Same  PROCEDURE:  REPEAT DEBRIDEMENT LEFT FOOT  SURGEON:  [name], MD  PHYSICIAN ASSISTANT:None ANESTHESIA:   General  PREOPERATIVE INDICATIONS:  Ronnie Shaw is a  39 y.o. male with a diagnosis of Left Foot Osteomyelitis who failed conservative measures and elected for surgical management.    The risks benefits and alternatives were discussed with the patient preoperatively including but not limited to the risks of infection, bleeding, nerve injury, cardiopulmonary complications, the need for revision surgery, among others, and the patient was willing to proceed.  OPERATIVE IMPLANTS: Cleanse choice wound VAC sponges x2  @ENCIMAGES @  OPERATIVE FINDINGS: Improved healthy tissue no abscess no necrotic tissue.  OPERATIVE PROCEDURE: Patient brought the operating room underwent a general anesthetic.  After adequate levels anesthesia were obtained patient's left lower extremity was prepped using Betadine paint and draped into a sterile field a timeout was called.  Had tissue that was improving there is no abscess at this time no black necrotic tissue.  A rondure was used to resect bone skin and soft tissue and muscle.  A 21 blade knife was also used to excise soft tissue.  The wound edges were healthy and viable there was still a large wound opening.  This was packed with the reticulated cleanse choice foam followed by the solid foam covered with derma tack and Covan.  This had a good suction fit patient's irrigation was resumed at 14 cc of irrigation plan to return the operating room on Wednesday for potential wound closure with transmetatarsal amputation.   DISCHARGE PLANNING:  Antibiotic duration: Continue IV antibiotics per infectious disease  Weightbearing: Nonweightbearing on the left  Pain medication: Opioid pathway  Dressing care/ Wound  VAC: Continue installation wound VAC  Ambulatory devices: Walker  Discharge to: Continue inpatient stay.  Follow-up: In the office 1 week post operative.

## 2020-03-04 NOTE — Interval H&P Note (Signed)
History and Physical Interval Note:  03/04/2020 2:42 PM  Ronnie Shaw  has presented today for surgery, with the diagnosis of Left Foot Osteomyelitis.  The various methods of treatment have been discussed with the patient and family. After consideration of risks, benefits and other options for treatment, the patient has consented to  Procedure(s): REPEAT DEBRIDEMENT LEFT FOOT (Left) as a surgical intervention.  The patient's history has been reviewed, patient examined, no change in status, stable for surgery.  I have reviewed the patient's chart and labs.  Questions were answered to the patient's satisfaction.     [name]

## 2020-03-04 NOTE — Plan of Care (Signed)
  Problem: Education: Goal: Knowledge of General Education information will improve Description: Including pain rating scale, medication(s)/side effects and non-pharmacologic comfort measures Outcome: Completed/Met

## 2020-03-04 NOTE — Interval H&P Note (Signed)
History and Physical Interval Note:  03/04/2020 6:56 AM  Ronnie Shaw  has presented today for surgery, with the diagnosis of Left Foot Osteomyelitis.  The various methods of treatment have been discussed with the patient and family. After consideration of risks, benefits and other options for treatment, the patient has consented to  Procedure(s): REPEAT DEBRIDEMENT LEFT FOOT (Left) as a surgical intervention.  The patient's history has been reviewed, patient examined, no change in status, stable for surgery.  I have reviewed the patient's chart and labs.  Questions were answered to the patient's satisfaction.     [name]

## 2020-03-05 ENCOUNTER — Inpatient Hospital Stay (HOSPITAL_COMMUNITY): Payer: Self-pay

## 2020-03-05 DIAGNOSIS — M86172 Other acute osteomyelitis, left ankle and foot: Secondary | ICD-10-CM

## 2020-03-05 LAB — BASIC METABOLIC PANEL
Anion gap: 10 (ref 5–15)
BUN: 48 mg/dL — ABNORMAL HIGH (ref 6–20)
CO2: 20 mmol/L — ABNORMAL LOW (ref 22–32)
Calcium: 8.3 mg/dL — ABNORMAL LOW (ref 8.9–10.3)
Chloride: 103 mmol/L (ref 98–111)
Creatinine, Ser: 1.72 mg/dL — ABNORMAL HIGH (ref 0.61–1.24)
GFR, Estimated: 52 mL/min — ABNORMAL LOW (ref >60)
Glucose, Bld: 135 mg/dL — ABNORMAL HIGH (ref 70–99)
Potassium: 4.7 mmol/L (ref 3.5–5.1)
Sodium: 133 mmol/L — ABNORMAL LOW (ref 135–145)

## 2020-03-05 LAB — AEROBIC/ANAEROBIC CULTURE W GRAM STAIN (SURGICAL/DEEP WOUND): Gram Stain: NONE SEEN

## 2020-03-05 LAB — CBC
HCT: 25.3 % — ABNORMAL LOW (ref 39.0–52.0)
Hemoglobin: 7.9 g/dL — ABNORMAL LOW (ref 13.0–17.0)
MCH: 27.7 pg (ref 26.0–34.0)
MCHC: 31.2 g/dL (ref 30.0–36.0)
MCV: 88.8 fL (ref 80.0–100.0)
Platelets: 448 10*3/uL — ABNORMAL HIGH (ref 150–400)
RBC: 2.85 MIL/uL — ABNORMAL LOW (ref 4.22–5.81)
RDW: 16.1 % — ABNORMAL HIGH (ref 11.5–15.5)
WBC: 17 10*3/uL — ABNORMAL HIGH (ref 4.0–10.5)
nRBC: 0 % (ref 0.0–0.2)

## 2020-03-05 LAB — GLUCOSE, CAPILLARY
Glucose-Capillary: 126 mg/dL — ABNORMAL HIGH (ref 70–99)
Glucose-Capillary: 128 mg/dL — ABNORMAL HIGH (ref 70–99)
Glucose-Capillary: 131 mg/dL — ABNORMAL HIGH (ref 70–99)
Glucose-Capillary: 148 mg/dL — ABNORMAL HIGH (ref 70–99)

## 2020-03-05 MED ORDER — HEPARIN SODIUM (PORCINE) 5000 UNIT/ML IJ SOLN
7500.0000 [IU] | Freq: Three times a day (TID) | INTRAMUSCULAR | Status: DC
  Administered 2020-03-05 – 2020-03-10 (×16): 7500 [IU] via SUBCUTANEOUS
  Filled 2020-03-05 (×17): qty 2

## 2020-03-05 MED ORDER — AMLODIPINE BESYLATE 5 MG PO TABS
5.0000 mg | ORAL_TABLET | Freq: Every day | ORAL | Status: DC
  Administered 2020-03-05 – 2020-03-07 (×3): 5 mg via ORAL
  Filled 2020-03-05 (×3): qty 1

## 2020-03-05 NOTE — Progress Notes (Signed)
Due to his wt, ok to increase his SQ heparin to 7500 units TID per Dr. Sunnie Nielsen.  Ulyses Southward, PharmD, BCIDP, AAHIVP, CPP Infectious Disease Pharmacist 03/05/2020 9:32 AM

## 2020-03-05 NOTE — Progress Notes (Signed)
PROGRESS NOTE    Ronnie Shaw  VOZ:[id number] DOB: 07/31/1980 DOA: 02/28/2020 PCP: Gildardo Pounds, NP   Brief Narrative: 39 year old with past medical history significant for obesity, type 2 diabetes, diabetic foot ulcer with right foot transmetatarsal amputation in 2020 presents to the ED with fevers, chills, nausea, vomiting and cough and worsening pain in his left foot. In the emergency room he was febrile, met sepsis criteria, work-up was concerning for necrotic wound on his left foot and acute kidney injury creatinine of 3.5. -Admitted with sepsis, necrotic diabetic foot ulcer and acute kidney injury. -Underwent  left foot fourth and fifth ray amputation, extensive excisional debridement of the skin and soft tissue muscle and fascia application of wound VAC on 11/17. Repeat debridement of left Foot on 11/19. -Dr Sharol Given is planning to go back to OR on Wednesday, options transmetatarsal amputation for partial foot salvage or transtibial amputation this will depend on Viability of tissue.      Assessment & Plan:   Principal Problem:   Severe sepsis with acute organ dysfunction (HCC) Active Problems:   Hyponatremia   Diabetic polyneuropathy associated with type 2 diabetes mellitus (HCC)   Acute kidney failure (HCC)   Diabetic ulcer of left foot (HCC)   Infection of left foot   Necrotizing fasciitis ([place])   Subacute osteomyelitis, left ankle and foot ([place])   Acute osteomyelitis of left foot (HCC)   1-Sepsis: POA Secondary  Gangrene left foot with osteomyelitis fifth and fourth metatarsal with necrotizing fasciitis and abscess. Group C strep bacteremia. -Continue with daptomycin. (Vancomycin was discontinued due to severe AKI) -Blood cultures grew with group B strep. -Dr. Sharol Given was consulted.  He recommended foot salvage intervention with fourth and fifth ray amputation.  Patient had a second opinion by Dr. Ginette Pitman who also recommended surgery. -ABIs normal. -Patient underwent  left foot fourth and fifth ray amputation, extensive excisional debridement of the skin and soft tissue muscle and fascia application of wound VAC on 11/17. -Repeated debridement of left foot on 11/19.  -Appreciate ID evaluation. -ECHO no evidence of vegetation.  -Culture growing few pseudomonas, Actinomyces, streptococcus. Antibiotics change to Zosyn 11/19. -Lower extremity ultrasound due to leg edema, pending.  -Dr Sharol Given is planning to go back to OR on Wednesday, options transmetatarsal amputation for partial foot salvage or transtibial amputation this will depend on Viability of tissue.   2-AKI: Creatinine baseline 1.6 from 7/21st.  Patient presented with a creatinine at 3.5 Suspected to be ATN from sepsis, creatinine trending down. -Creatinine  Down to 1.8 improving, close to baseline.  -He received IV fluids. -labs pending for the morning.   3-Obesity, type 2 diabetes: BMI 44, weight loss was encouraged Hemoglobin A1c 6.4.  Continue with a sliding scale insulin  4-Abnormal chest x-ray, right upper lobe infiltrate Continue with incentive spirometry, with flutter valve. Reported some cough and shortness of breath.  Chest x-ray repeated today negative  5-Social;  Does not have a PCP, care management has arranged follow-up with wellness clinic.  6-Abdominal pain: Resolved.  Abdominal exam benign.  No rigidity. KUB was negative for obstruction Had BM. Holding  laxatives report diarrhea.  Continue with PPI  7-Hyponatremia;  Resolved.   8-Anemia;  Hb range 9--7.9 for last 3 days.  Hb at 9 (11 months ago).  Received one unit PRBC 11/17 anemia panel; iron deficiency anemia.  Plan to start oral iron when infection sources controlled Hb stable.   9-HTN; start low dose Norvasc.   Nutrition Problem: Increased nutrient  needs Etiology: wound healing    Signs/Symptoms: estimated needs    Interventions: Premier Protein, Juven  Estimated body mass index is 48.92 kg/m as  calculated from the following:   Height as of this encounter: _0  (1.854 m).   Weight as of this encounter: 168.2 kg.   DVT prophylaxis: Heparin Code Status: Full code Family Communication: Care discussed with patient Disposition Plan:  Status is: Inpatient  Remains inpatient appropriate because:IV treatments appropriate due to intensity of illness or inability to take PO   Dispo: The patient is from: Home              Anticipated d/c is to: To be determined              Anticipated d/c date is: 3 days              Patient currently is not medically stable to d/c.        Consultants:   Dr. Sharol Given  Procedures:  ABI negative Antimicrobials:    Subjective: Denies abdominal pain or dyspnea.    Objective: Vitals:   03/04/20 1730 03/04/20 2137 03/05/20 0358 03/05/20 0909  BP: (!) 159/90 (!) 162/92 (!) 168/90 (!) 178/89  Pulse: 97 (!) 105 91 91  Resp: _1 Temp: 98.5 F (36.9 C) 98.4 F (36.9 C) 98.8 F (37.1 C) 98.6 F (37 C)  TempSrc: Oral  Oral Oral  SpO2: 97% 98% 97% 99%  Weight:  (!) 168.2 kg    Height:        Intake/Output Summary (Last 24 hours) at 03/05/2020 1139 Last data filed at 03/05/2020 1000 Gross per 24 hour  Intake 1303.05 ml  Output 2701 ml  Net -1397.95 ml   Filed Weights   03/03/20 2015 03/04/20 1421 03/04/20 2137  Weight: (!) 168.2 kg (!) 168.2 kg (!) 168.2 kg    Examination:  General exam: NAD Respiratory system: CTA Cardiovascular system: S1, S 2 RRR Gastrointestinal system: BS present, soft. , nt  Central nervous system: alert , follows command Extremities: BL LE with dressing. Left foot with wound VAC in place, calf tenderness   Data Reviewed: I have personally reviewed following labs and imaging studies  CBC: Recent Labs  Lab 02/28/20 1140 02/28/20 1140 02/29/20 0714 02/29/20 0714 03/01/20 0341 03/02/20 0048 03/02/20 1828 03/03/20 0127 03/04/20 1036  WBC 16.2*   < > 15.3*  --  14.5* 15.9*  --  19.6*  17.7*  NEUTROABS 14.8*  --   --   --   --   --   --   --   --   HGB 9.3*   < > 7.9*   < > 7.9* 7.5* 8.0* 8.2* 8.1*  HCT 28.8*   < > 24.7*   < > 24.9* 23.2* 24.9* 25.7* 25.8*  MCV 84.7   < > 85.5  --  85.6 84.4  --  85.4 86.9  PLT 345   < > 278  --  268 289  --  336 416*   < > = values in this interval not displayed.   Basic Metabolic Panel: Recent Labs  Lab 02/29/20 0714 03/01/20 0341 03/02/20 0048 03/03/20 0127 03/04/20 1036  NA 130* 130* 130* 131* 135  K 4.1 4.4 4.2 5.1 4.5  CL 101 100 101 103 104  CO2 20* 19* 18* 18* 19*  GLUCOSE 130* 159* 150* 209* 160*  BUN 35* 41* 44* 51* 60*  CREATININE 3.18* 2.70* 2.35* 2.33* 1.83*  CALCIUM 8.1* 8.1* 8.1* 8.4* 8.6*   GFR: Estimated Creatinine Clearance: 90.1 mL/min (A) (by C-G formula based on SCr of 1.83 mg/dL (H)). Liver Function Tests: Recent Labs  Lab 02/28/20 1140 02/29/20 0714  AST 32 39  ALT 29 33  ALKPHOS 89 84  BILITOT 0.6 0.5  PROT 8.3* 7.0  ALBUMIN 2.7* 2.0*   No results for input(s): LIPASE, AMYLASE in the last 168 hours. No results for input(s): AMMONIA in the last 168 hours. Coagulation Profile: Recent Labs  Lab 02/28/20 1140 02/29/20 0714  INR 1.3* 1.4*   Cardiac Enzymes: Recent Labs  Lab 02/29/20 1206 03/04/20 0246  CKTOTAL 979* 273   BNP (last 3 results) No results for input(s): PROBNP in the last 8760 hours. HbA1C: No results for input(s): HGBA1C in the last 72 hours. CBG: Recent Labs  Lab 03/04/20 1142 03/04/20 1328 03/04/20 1635 03/04/20 2137 03/05/20 0626  GLUCAP 168* 120* 145* 173* 126*   Lipid Profile: No results for input(s): CHOL, HDL, LDLCALC, TRIG, CHOLHDL, LDLDIRECT in the last 72 hours. Thyroid Function Tests: No results for input(s): TSH, T4TOTAL, FREET4, T3FREE, THYROIDAB in the last 72 hours. Anemia Panel: Recent Labs    03/03/20 0127  VITAMINB12 836  FOLATE 14.7  FERRITIN 591*  TIBC 157*  IRON 23*  RETICCTPCT 0.7   Sepsis Labs: Recent Labs  Lab  02/28/20 1140 02/28/20 1540 02/29/20 0714  PROCALCITON  --   --  20.82  LATICACIDVEN 1.5 1.6  --     Recent Results (from the past 240 hour(s))  Culture, blood (Routine x 2)     Status: Abnormal   Collection Time: 02/28/20 11:40 AM   Specimen: BLOOD  Result Value Ref Range Status   Specimen Description   Final    BLOOD RIGHT ANTECUBITAL Performed at [facility], [place] [address], [place], [place] [address]    Special Requests   Final    BOTTLES DRAWN AEROBIC AND ANAEROBIC Blood Culture adequate volume Performed at [facility], [address] [place], [place] [address]    Culture  Setup Time   Final    GRAM POSITIVE COCCI IN CHAINS ANAEROBIC BOTTLE ONLY CRITICAL RESULT CALLED TO, READ BACK BY AND VERIFIED WITH: Earmon Phoenix [phone] FCP Performed at [facility], [place] [address], [place], [place] [address]    Culture STREPTOCOCCUS GROUP C (A)  Final   Report Status 03/02/2020 FINAL  Final   Organism ID, Bacteria STREPTOCOCCUS GROUP C  Final      Susceptibility   Streptococcus group c - MIC*    CLINDAMYCIN >=1 RESISTANT Resistant     AMPICILLIN <=0.25 SENSITIVE Sensitive     ERYTHROMYCIN >=8 RESISTANT Resistant     VANCOMYCIN 0.5 SENSITIVE Sensitive     CEFTRIAXONE 0.25 SENSITIVE Sensitive     LEVOFLOXACIN <=0.25 SENSITIVE Sensitive     PENICILLIN Value in next row Sensitive      SENSITIVE<=0.06    * STREPTOCOCCUS GROUP C  Blood Culture ID Panel (Reflexed)     Status: Abnormal   Collection Time: 02/28/20 11:40 AM  Result Value Ref Range Status   Enterococcus faecalis NOT DETECTED NOT DETECTED Final   Enterococcus Faecium NOT DETECTED NOT DETECTED Final   Listeria monocytogenes NOT DETECTED NOT DETECTED Final   Staphylococcus species NOT DETECTED NOT DETECTED Final   Staphylococcus aureus (BCID) NOT DETECTED NOT DETECTED Final   Staphylococcus epidermidis NOT DETECTED NOT DETECTED Final   Staphylococcus lugdunensis  NOT DETECTED NOT  DETECTED Final   Streptococcus species DETECTED (A) NOT DETECTED Final    Comment: Not Enterococcus species, Streptococcus agalactiae, Streptococcus pyogenes, or Streptococcus pneumoniae. CRITICAL RESULT CALLED TO, READ BACK BY AND VERIFIED WITH: PHARMD H. VONDOHLEN [phone] FCP    Streptococcus agalactiae NOT DETECTED NOT DETECTED Final   Streptococcus pneumoniae NOT DETECTED NOT DETECTED Final   Streptococcus pyogenes NOT DETECTED NOT DETECTED Final   A.calcoaceticus-baumannii NOT DETECTED NOT DETECTED Final   Bacteroides fragilis NOT DETECTED NOT DETECTED Final   Enterobacterales NOT DETECTED NOT DETECTED Final   Enterobacter cloacae complex NOT DETECTED NOT DETECTED Final   Escherichia coli NOT DETECTED NOT DETECTED Final   Klebsiella aerogenes NOT DETECTED NOT DETECTED Final   Klebsiella oxytoca NOT DETECTED NOT DETECTED Final   Klebsiella pneumoniae NOT DETECTED NOT DETECTED Final   Proteus species NOT DETECTED NOT DETECTED Final   Salmonella species NOT DETECTED NOT DETECTED Final   Serratia marcescens NOT DETECTED NOT DETECTED Final   Haemophilus influenzae NOT DETECTED NOT DETECTED Final   Neisseria meningitidis NOT DETECTED NOT DETECTED Final   Pseudomonas aeruginosa NOT DETECTED NOT DETECTED Final   Stenotrophomonas maltophilia NOT DETECTED NOT DETECTED Final   Candida albicans NOT DETECTED NOT DETECTED Final   Candida auris NOT DETECTED NOT DETECTED Final   Candida glabrata NOT DETECTED NOT DETECTED Final   Candida krusei NOT DETECTED NOT DETECTED Final   Candida parapsilosis NOT DETECTED NOT DETECTED Final   Candida tropicalis NOT DETECTED NOT DETECTED Final   Cryptococcus neoformans/gattii NOT DETECTED NOT DETECTED Final    Comment: Performed at [facility], [address], [place], [place] [address]  Culture, blood (Routine x 2)     Status: None   Collection Time: 02/28/20 12:20 PM   Specimen: BLOOD LEFT HAND  Result Value Ref Range Status   Specimen  Description   Final    BLOOD LEFT HAND Performed at [facility], [address] [place], [place] [address]    Special Requests   Final    BOTTLES DRAWN AEROBIC AND ANAEROBIC Blood Culture adequate volume Performed at [facility], [address] [place], [place] [address]    Culture   Final    NO GROWTH 5 DAYS Performed at [facility], [place] [address], [place], [place] [address]    Report Status 03/04/2020 FINAL  Final  Respiratory Panel by RT PCR (Flu A&B, Covid) - Nasopharyngeal Swab     Status: None   Collection Time: 02/28/20 12:29 PM   Specimen: Nasopharyngeal Swab  Result Value Ref Range Status   SARS Coronavirus 2 by RT PCR NEGATIVE NEGATIVE Final    Comment: (NOTE) SARS-CoV-2 target nucleic acids are NOT DETECTED.  The SARS-CoV-2 RNA is generally detectable in upper respiratoy specimens during the acute phase of infection. The lowest concentration of SARS-CoV-2 viral copies this assay can detect is 131 copies/mL. A negative result does not preclude SARS-Cov-2 infection and should not be used as the sole basis for treatment or other patient management decisions. A negative result may occur with  improper specimen collection/handling, submission of specimen other than nasopharyngeal swab, presence of viral mutation(s) within the areas targeted by this assay, and inadequate number of viral copies (<131 copies/mL). A negative result must be combined with clinical observations, patient history, and epidemiological information. The expected result is Negative.  Fact Sheet for Patients:  PinkCheek.be  Fact Sheet for Healthcare Providers:  GravelBags.it  This test is no t yet approved or cleared by the [place] and  has been authorized for detection and/or diagnosis of SARS-CoV-2 by FDA under an Emergency Use Authorization (EUA). This EUA will remain  in effect (meaning this  test can be used) for the duration of the COVID-19 declaration under Section 564(b)(1) of the Act, 21 U.S.C. section 360bbb-3(b)(1), unless the authorization is terminated or revoked sooner.     Influenza A by PCR NEGATIVE NEGATIVE Final   Influenza B by PCR NEGATIVE NEGATIVE Final    Comment: (NOTE) The Xpert Xpress SARS-CoV-2/FLU/RSV assay is intended as an aid in  the diagnosis of influenza from Nasopharyngeal swab specimens and  should not be used as a sole basis for treatment. Nasal washings and  aspirates are unacceptable for Xpert Xpress SARS-CoV-2/FLU/RSV  testing.  Fact Sheet for Patients: PinkCheek.be  Fact Sheet for Healthcare Providers: GravelBags.it  This test is not yet approved or cleared by the [place] FDA and  has been authorized for detection and/or diagnosis of SARS-CoV-2 by  FDA under an Emergency Use Authorization (EUA). This EUA will remain  in effect (meaning this test can be used) for the duration of the  Covid-19 declaration under Section 564(b)(1) of the Act, 21  U.S.C. section 360bbb-3(b)(1), unless the authorization is  terminated or revoked. Performed at [facility], [address], [place], [place] [address]   Surgical pcr screen     Status: None   Collection Time: 03/02/20  8:59 AM   Specimen: Nasal Mucosa; Nasal Swab  Result Value Ref Range Status   MRSA, PCR NEGATIVE NEGATIVE Final   Staphylococcus aureus NEGATIVE NEGATIVE Final    Comment: (NOTE) The Xpert SA Assay (FDA approved for NASAL specimens in patients 23 years of age and older), is one component of a comprehensive surveillance program. It is not intended to diagnose infection nor to guide or monitor treatment. Performed at [facility], [place] [address] [place], [place] [address]   Aerobic/Anaerobic Culture (surgical/deep wound)     Status: None (Preliminary result)   Collection Time: 03/02/20  3:37  PM   Specimen: Soft Tissue, Other  Result Value Ref Range Status   Specimen Description TISSUE LEFT FOOT  Final   Special Requests PT ON ROCEPHIN  Final   Gram Stain   Final    NO WBC SEEN ABUNDANT GRAM POSITIVE COCCI RARE GRAM NEGATIVE RODS    Culture   Final    FEW PSEUDOMONAS AERUGINOSA CULTURE REINCUBATED FOR BETTER GROWTH Performed at [facility], [place] [address] [place], [place] [address]    Report Status PENDING  Incomplete  Aerobic/Anaerobic Culture (surgical/deep wound)     Status: None (Preliminary result)   Collection Time: 03/02/20  3:41 PM   Specimen: Soft Tissue, Other  Result Value Ref Range Status   Specimen Description TISSUE LEFT FOOT  Final   Special Requests SAMPLE B DEEP TISSUE PT ON ROCEPHIN  Final   Gram Stain NO WBC SEEN RARE GRAM POSITIVE COCCI   Final   Culture   Final    RARE ACTINOMYCES ODONTOLYTICUS RARE STREPTOCOCCUS ANGINOSIS CULTURE REINCUBATED FOR BETTER GROWTH Performed at [facility], [place] [address] [place], [place] [address]    Report Status PENDING  Incomplete  Culture, blood (routine x 2)     Status: None (Preliminary result)   Collection Time: 03/03/20  1:02 PM   Specimen: BLOOD  Result Value Ref Range Status  Specimen Description BLOOD RIGHT ANTECUBITAL  Final   Special Requests   Final    BOTTLES DRAWN AEROBIC AND ANAEROBIC Blood Culture adequate volume   Culture   Final    NO GROWTH 2 DAYS Performed at [facility], [address], [place], [place] [address]    Report Status PENDING  Incomplete  Culture, blood (routine x 2)     Status: None (Preliminary result)   Collection Time: 03/03/20  1:14 PM   Specimen: BLOOD  Result Value Ref Range Status   Specimen Description BLOOD LEFT ANTECUBITAL  Final   Special Requests   Final    BOTTLES DRAWN AEROBIC AND ANAEROBIC Blood Culture adequate volume   Culture   Final    NO GROWTH 2 DAYS Performed at [facility], [place] [address], [place], [place]  [address]    Report Status PENDING  Incomplete         Radiology Studies: ECHOCARDIOGRAM COMPLETE  Result Date: 03/03/2020    ECHOCARDIOGRAM REPORT   Patient Name:   Ronnie Shaw Date of Exam: 03/03/2020 Medical Rec #:  [id number]        Height:       73.0 in Accession #:    [id number]       Weight:       333.6 lb Date of Birth:  [birth year]         BSA:          2.674 m Patient Age:    78 years         BP:           141/89 mmHg Patient Gender: M                HR:           77 bpm. Exam Location:  Inpatient Procedure: 2D Echo, Cardiac Doppler and Color Doppler Indications:    Bacteremia [790.7.ICD-9-CM]  History:        Patient has no prior history of Echocardiogram examinations.                 Risk Factors:Diabetes, Sleep Apnea and Former Smoker.  Sonographer:    Vickie Epley RDCS Referring Phys: [id number] [place]  1. Left ventricular ejection fraction, by estimation, is 55 to 60%. The left ventricle has normal function. The left ventricle has no regional wall motion abnormalities. The left ventricular internal cavity size was mildly dilated. There is moderate left ventricular hypertrophy. Left ventricular diastolic parameters were normal.  2. Right ventricular systolic function is normal. The right ventricular size is mildly enlarged. Tricuspid regurgitation signal is inadequate for assessing PA pressure.  3. The mitral valve is normal in structure. Trivial mitral valve regurgitation.  4. The aortic valve is tricuspid. Aortic valve regurgitation is not visualized. No aortic stenosis is present.  5. Aortic dilatation noted. There is mild dilatation of the ascending aorta, measuring 37 mm.  6. The inferior vena cava is normal in size with greater than 50% respiratory variability, suggesting right atrial pressure of 3 mmHg. Conclusion(s)/Recommendation(s): No vegetation seen. FINDINGS  Left Ventricle: Left ventricular ejection fraction, by estimation, is 55 to 60%. The left ventricle has  normal function. The left ventricle has no regional wall motion abnormalities. The left ventricular internal cavity size was mildly dilated. There is  moderate left ventricular hypertrophy. Left ventricular diastolic parameters were normal. Right Ventricle: The right ventricular size is mildly enlarged. No increase in right ventricular wall  thickness. Right ventricular systolic function is normal. Tricuspid regurgitation signal is inadequate for assessing PA pressure. The tricuspid regurgitant velocity is 1.47 m/s, and with an assumed right atrial pressure of 3 mmHg, the estimated right ventricular systolic pressure is 56.3 mmHg. Left Atrium: Left atrial size was normal in size. Right Atrium: Right atrial size was normal in size. Pericardium: Trivial pericardial effusion is present. Mitral Valve: The mitral valve is normal in structure. Trivial mitral valve regurgitation. Tricuspid Valve: The tricuspid valve is normal in structure. Tricuspid valve regurgitation is trivial. Aortic Valve: The aortic valve is tricuspid. Aortic valve regurgitation is not visualized. No aortic stenosis is present. Pulmonic Valve: The pulmonic valve was grossly normal. Pulmonic valve regurgitation is not visualized. Aorta: The aortic root is normal in size and structure and aortic dilatation noted. There is mild dilatation of the ascending aorta, measuring 37 mm. Venous: The inferior vena cava is normal in size with greater than 50% respiratory variability, suggesting right atrial pressure of 3 mmHg. IAS/Shunts: The interatrial septum was not well visualized.  LEFT VENTRICLE PLAX 2D LVIDd:         5.90 cm      Diastology LVIDs:         4.00 cm      LV e' medial:    9.11 cm/s LV PW:         1.10 cm      LV E/e' medial:  10.5 LV IVS:        1.10 cm      LV e' lateral:   11.50 cm/s LVOT diam:     2.70 cm      LV E/e' lateral: 8.3 LV SV:         115 LV SV Index:   43 LVOT Area:     5.73 cm  LV Volumes (MOD) LV vol d, MOD A2C: 167.0 ml LV vol  d, MOD A4C: 192.0 ml LV vol s, MOD A2C: 70.5 ml LV vol s, MOD A4C: 93.2 ml LV SV MOD A2C:     96.5 ml LV SV MOD A4C:     192.0 ml LV SV MOD BP:      106.6 ml RIGHT VENTRICLE RV S prime:     14.30 cm/s TAPSE (M-mode): 2.2 cm LEFT ATRIUM             Index       RIGHT ATRIUM           Index LA diam:        4.20 cm 1.57 cm/m  RA Area:     19.80 cm LA Vol (A2C):   56.7 ml 21.20 ml/m RA Volume:   61.70 ml  23.07 ml/m LA Vol (A4C):   31.3 ml 11.70 ml/m LA Biplane Vol: 44.4 ml 16.60 ml/m  AORTIC VALVE LVOT Vmax:   113.00 cm/s LVOT Vmean:  70.800 cm/s LVOT VTI:    0.201 m  AORTA Ao Root diam: 3.90 cm Ao Asc diam:  3.70 cm MITRAL VALVE               TRICUSPID VALVE MV Area (PHT): 4.21 cm    TR Peak grad:   8.6 mmHg MV Decel Time: 180 msec    TR Vmax:        147.00 cm/s MV E velocity: 95.50 cm/s MV A velocity: 64.30 cm/s  SHUNTS MV E/A ratio:  1.49        Systemic VTI:  0.20 m  Systemic Diam: 2.70 cm Oswaldo Milian MD Electronically signed by Oswaldo Milian MD Signature Date/Time: 03/03/2020/7:59:51 PM    Final         Scheduled Meds: . amLODipine  5 mg Oral Daily  . chlorhexidine  15 mL Mouth/Throat NOW  . heparin  7,500 Units Subcutaneous Q8H  . insulin aspart  0-5 Units Subcutaneous QHS  . insulin aspart  0-9 Units Subcutaneous TID WC  . multivitamin with minerals  1 tablet Oral Daily  . pantoprazole  40 mg Oral Daily  . Ensure Max Protein  11 oz Oral TID   Continuous Infusions: . sodium chloride 10 mL/hr at 03/04/20 1840  . DAPTOmycin (CUBICIN)  IV 650 mg (03/04/20 2242)  . lactated ringers    . lactated ringers 10 mL/hr at 03/04/20 1538  . lactated ringers    . piperacillin-tazobactam (ZOSYN)  IV 3.375 g (03/05/20 0631)     LOS: 6 days    Time spent: 35 minutes.     Elmarie Shiley, MD [facility]   If 7PM-7AM, please contact night-coverage [email or web address]  03/05/2020, 11:39 AM

## 2020-03-05 NOTE — Progress Notes (Signed)
Patient ID: Ronnie Shaw, male   DOB: 06/08/1980, 39 y.o.   MRN: [id number] Patient is postop day 1 status post repeat debridement of the left foot.  Patient still has an elevated white cell count hemoglobin is stable.  Discussed with the patient there was no further necrotic tissue no abscess however there was no signs of any healing of the soft tissue.  We will continue with the installation wound VAC and plan for return to the operating room on Wednesday.  Discussed treatment options are either to proceed with a transmetatarsal amputation for attempted partial foot salvage or transtibial amputation this will depend on the viability of the soft tissue at the time of surgery.

## 2020-03-05 NOTE — Progress Notes (Signed)
    [facility]   Reason for visit: Follow up on osteomyelitis  Interval History: s/p debridement yesterday.  No new positive culture growth.   Physical Exam: Constitutional:  Vitals:   03/05/20 0358 03/05/20 0909  BP: (!) 168/90 (!) 178/89  Pulse: 91 91  Resp: 18 20  Temp: 98.8 F (37.1 C) 98.6 F (37 C)  SpO2: 97% 99%   patient appears in NAD  Impression: osteomyelitis.  Antibiotic duration will depend on whether or not the foot is salvageable.  Back to the OR on Wed.    Plan: 1.  No changes

## 2020-03-06 ENCOUNTER — Inpatient Hospital Stay (HOSPITAL_COMMUNITY): Payer: Self-pay

## 2020-03-06 DIAGNOSIS — R609 Edema, unspecified: Secondary | ICD-10-CM

## 2020-03-06 LAB — CBC
HCT: 24.5 % — ABNORMAL LOW (ref 39.0–52.0)
Hemoglobin: 7.6 g/dL — ABNORMAL LOW (ref 13.0–17.0)
MCH: 27.1 pg (ref 26.0–34.0)
MCHC: 31 g/dL (ref 30.0–36.0)
MCV: 87.5 fL (ref 80.0–100.0)
Platelets: 427 10*3/uL — ABNORMAL HIGH (ref 150–400)
RBC: 2.8 MIL/uL — ABNORMAL LOW (ref 4.22–5.81)
RDW: 15.9 % — ABNORMAL HIGH (ref 11.5–15.5)
WBC: 17.6 10*3/uL — ABNORMAL HIGH (ref 4.0–10.5)
nRBC: 0 % (ref 0.0–0.2)

## 2020-03-06 LAB — BASIC METABOLIC PANEL
Anion gap: 8 (ref 5–15)
BUN: 43 mg/dL — ABNORMAL HIGH (ref 6–20)
CO2: 22 mmol/L (ref 22–32)
Calcium: 8.2 mg/dL — ABNORMAL LOW (ref 8.9–10.3)
Chloride: 102 mmol/L (ref 98–111)
Creatinine, Ser: 1.68 mg/dL — ABNORMAL HIGH (ref 0.61–1.24)
GFR, Estimated: 53 mL/min — ABNORMAL LOW (ref >60)
Glucose, Bld: 131 mg/dL — ABNORMAL HIGH (ref 70–99)
Potassium: 4.6 mmol/L (ref 3.5–5.1)
Sodium: 132 mmol/L — ABNORMAL LOW (ref 135–145)

## 2020-03-06 LAB — GLUCOSE, CAPILLARY
Glucose-Capillary: 129 mg/dL — ABNORMAL HIGH (ref 70–99)
Glucose-Capillary: 137 mg/dL — ABNORMAL HIGH (ref 70–99)
Glucose-Capillary: 139 mg/dL — ABNORMAL HIGH (ref 70–99)
Glucose-Capillary: 143 mg/dL — ABNORMAL HIGH (ref 70–99)

## 2020-03-06 LAB — AEROBIC/ANAEROBIC CULTURE W GRAM STAIN (SURGICAL/DEEP WOUND): Gram Stain: NONE SEEN

## 2020-03-06 MED ORDER — ENSURE MAX PROTEIN PO LIQD
11.0000 [oz_av] | Freq: Three times a day (TID) | ORAL | Status: DC
  Administered 2020-03-06 – 2020-03-11 (×15): 11 [oz_av] via ORAL
  Filled 2020-03-06 (×23): qty 330

## 2020-03-06 NOTE — Progress Notes (Signed)
VASCULAR LAB    Bilateral lower extremity venous duplex has been performed.  See CV proc for preliminary results.   Sharline Lehane, RVT 03/06/2020, 3:16 PM

## 2020-03-06 NOTE — Progress Notes (Signed)
PROGRESS NOTE    Ronnie Shaw  QMG:[id number] DOB: 1980/07/20 DOA: 02/28/2020 PCP: Gildardo Pounds, NP    Brief Narrative:  39 year old with past medical history significant for obesity, type 2 diabetes, diabetic foot ulcer with right foot transmetatarsal amputation in 2020 presents to the ED with fevers, chills, nausea, vomiting and cough and worsening pain in his left foot. In the emergency room he was febrile, met sepsis criteria, work-up was concerning for necrotic wound on his left foot and acute kidney injury creatinine of 3.5. -Admitted with sepsis, necrotic diabetic foot ulcer and acute kidney injury. -Underwent  left foot fourth and fifth ray amputation, extensive excisional debridement of the skin and soft tissue muscle and fascia application of wound VAC on 11/17. Repeat debridement of left Foot on 11/19. -Dr Sharol Given is planning to go back to OR on Wednesday, options transmetatarsal amputation for partial foot salvage or transtibial amputation this will depend on Viability of tissue.   11/21-no new culture growth. Antibiotic duration will depend on whether or not the foot is salvageable..  Consultants:   ID, orthopedics  Procedures: Left foot debridement, wound VAC  Antimicrobials:   Daptomycin, Zosyn   Subjective: Eating breakfast, has no complaints. Pain controlled.  Objective: Vitals:   03/05/20 1838 03/05/20 2152 03/06/20 0202 03/06/20 0545  BP: (!) 168/89 (!) 141/90  (!) 153/92  Pulse: 97   82  Resp: _0 Temp: 99.6 F (37.6 C) 98.5 F (36.9 C)  98.1 F (36.7 C)  TempSrc: Oral Oral  Oral  SpO2: 97% 98% 97% 96%  Weight:      Height:        Intake/Output Summary (Last 24 hours) at 03/06/2020 0806 Last data filed at 03/06/2020 0803 Gross per 24 hour  Intake 763.21 ml  Output 4600 ml  Net -3836.79 ml   Filed Weights   03/03/20 2015 03/04/20 1421 03/04/20 2137  Weight: (!) 168.2 kg (!) 168.2 kg (!) 168.2 kg    Examination:  General  exam: Appears calm and comfortable  Respiratory system: Clear to auscultation. Respiratory effort normal. Cardiovascular system: S1 & S2 heard, RRR. No JVD, murmurs, rubs, gallops or clicks.  Gastrointestinal system: Abdomen is nondistended, soft and nontender.. Normal bowel sounds heard. Central nervous system: Alert and oriented. Grossly intact Extremities: Left foot dressing in place did not open, right foot no edema Skin: Warm dry Psychiatry: Judgement and insight appear normal. Mood & affect appropriate.     Data Reviewed: I have personally reviewed following labs and imaging studies  CBC: Recent Labs  Lab 02/28/20 1140 02/29/20 0714 03/02/20 0048 03/02/20 0048 03/02/20 1828 03/03/20 0127 03/04/20 1036 03/05/20 1205 03/06/20 0436  WBC 16.2*   < > 15.9*  --   --  19.6* 17.7* 17.0* 17.6*  NEUTROABS 14.8*  --   --   --   --   --   --   --   --   HGB 9.3*   < > 7.5*   < > 8.0* 8.2* 8.1* 7.9* 7.6*  HCT 28.8*   < > 23.2*   < > 24.9* 25.7* 25.8* 25.3* 24.5*  MCV 84.7   < > 84.4  --   --  85.4 86.9 88.8 87.5  PLT 345   < > 289  --   --  336 416* 448* 427*   < > = values in this interval not displayed.   Basic Metabolic Panel: Recent Labs  Lab 03/02/20 0048 03/03/20 0127 03/04/20  1036 03/05/20 1205 03/06/20 0436  NA 130* 131* 135 133* 132*  K 4.2 5.1 4.5 4.7 4.6  CL 101 103 104 103 102  CO2 18* 18* 19* 20* 22  GLUCOSE 150* 209* 160* 135* 131*  BUN 44* 51* 60* 48* 43*  CREATININE 2.35* 2.33* 1.83* 1.72* 1.68*  CALCIUM 8.1* 8.4* 8.6* 8.3* 8.2*   GFR: Estimated Creatinine Clearance: 98.1 mL/min (A) (by C-G formula based on SCr of 1.68 mg/dL (H)). Liver Function Tests: Recent Labs  Lab 02/28/20 1140 02/29/20 0714  AST 32 39  ALT 29 33  ALKPHOS 89 84  BILITOT 0.6 0.5  PROT 8.3* 7.0  ALBUMIN 2.7* 2.0*   No results for input(s): LIPASE, AMYLASE in the last 168 hours. No results for input(s): AMMONIA in the last 168 hours. Coagulation Profile: Recent Labs  Lab  02/28/20 1140 02/29/20 0714  INR 1.3* 1.4*   Cardiac Enzymes: Recent Labs  Lab 02/29/20 1206 03/04/20 0246  CKTOTAL 979* 273   BNP (last 3 results) No results for input(s): PROBNP in the last 8760 hours. HbA1C: No results for input(s): HGBA1C in the last 72 hours. CBG: Recent Labs  Lab 03/05/20 0626 03/05/20 1140 03/05/20 1630 03/05/20 2149 03/06/20 0702  GLUCAP 126* 128* 131* 148* 129*   Lipid Profile: No results for input(s): CHOL, HDL, LDLCALC, TRIG, CHOLHDL, LDLDIRECT in the last 72 hours. Thyroid Function Tests: No results for input(s): TSH, T4TOTAL, FREET4, T3FREE, THYROIDAB in the last 72 hours. Anemia Panel: No results for input(s): VITAMINB12, FOLATE, FERRITIN, TIBC, IRON, RETICCTPCT in the last 72 hours. Sepsis Labs: Recent Labs  Lab 02/28/20 1140 02/28/20 1540 02/29/20 0714  PROCALCITON  --   --  20.82  LATICACIDVEN 1.5 1.6  --     Recent Results (from the past 240 hour(s))  Culture, blood (Routine x 2)     Status: Abnormal   Collection Time: 02/28/20 11:40 AM   Specimen: BLOOD  Result Value Ref Range Status   Specimen Description   Final    BLOOD RIGHT ANTECUBITAL Performed at [facility], [place] [address] [place], [place] [address]    Special Requests   Final    BOTTLES DRAWN AEROBIC AND ANAEROBIC Blood Culture adequate volume Performed at [facility], [address] [place], [place] [address]    Culture  Setup Time   Final    GRAM POSITIVE COCCI IN CHAINS ANAEROBIC BOTTLE ONLY CRITICAL RESULT CALLED TO, READ BACK BY AND VERIFIED WITH: Earmon Phoenix [phone] FCP Performed at [facility], [place] [address] [place], [place] [address]    Culture STREPTOCOCCUS GROUP C (A)  Final   Report Status 03/02/2020 FINAL  Final   Organism ID, Bacteria STREPTOCOCCUS GROUP C  Final      Susceptibility   Streptococcus group c - MIC*    CLINDAMYCIN >=1 RESISTANT Resistant     AMPICILLIN <=0.25 SENSITIVE Sensitive      ERYTHROMYCIN >=8 RESISTANT Resistant     VANCOMYCIN 0.5 SENSITIVE Sensitive     CEFTRIAXONE 0.25 SENSITIVE Sensitive     LEVOFLOXACIN <=0.25 SENSITIVE Sensitive     PENICILLIN Value in next row Sensitive      SENSITIVE<=0.06    * STREPTOCOCCUS GROUP C  Blood Culture ID Panel (Reflexed)     Status: Abnormal   Collection Time: 02/28/20 11:40 AM  Result Value Ref Range Status   Enterococcus faecalis NOT DETECTED NOT DETECTED Final   Enterococcus Faecium NOT DETECTED  NOT DETECTED Final   Listeria monocytogenes NOT DETECTED NOT DETECTED Final   Staphylococcus species NOT DETECTED NOT DETECTED Final   Staphylococcus aureus (BCID) NOT DETECTED NOT DETECTED Final   Staphylococcus epidermidis NOT DETECTED NOT DETECTED Final   Staphylococcus lugdunensis NOT DETECTED NOT DETECTED Final   Streptococcus species DETECTED (A) NOT DETECTED Final    Comment: Not Enterococcus species, Streptococcus agalactiae, Streptococcus pyogenes, or Streptococcus pneumoniae. CRITICAL RESULT CALLED TO, READ BACK BY AND VERIFIED WITH: PHARMD H. VONDOHLEN [phone] FCP    Streptococcus agalactiae NOT DETECTED NOT DETECTED Final   Streptococcus pneumoniae NOT DETECTED NOT DETECTED Final   Streptococcus pyogenes NOT DETECTED NOT DETECTED Final   A.calcoaceticus-baumannii NOT DETECTED NOT DETECTED Final   Bacteroides fragilis NOT DETECTED NOT DETECTED Final   Enterobacterales NOT DETECTED NOT DETECTED Final   Enterobacter cloacae complex NOT DETECTED NOT DETECTED Final   Escherichia coli NOT DETECTED NOT DETECTED Final   Klebsiella aerogenes NOT DETECTED NOT DETECTED Final   Klebsiella oxytoca NOT DETECTED NOT DETECTED Final   Klebsiella pneumoniae NOT DETECTED NOT DETECTED Final   Proteus species NOT DETECTED NOT DETECTED Final   Salmonella species NOT DETECTED NOT DETECTED Final   Serratia marcescens NOT DETECTED NOT DETECTED Final   Haemophilus influenzae NOT DETECTED NOT DETECTED Final   Neisseria meningitidis  NOT DETECTED NOT DETECTED Final   Pseudomonas aeruginosa NOT DETECTED NOT DETECTED Final   Stenotrophomonas maltophilia NOT DETECTED NOT DETECTED Final   Candida albicans NOT DETECTED NOT DETECTED Final   Candida auris NOT DETECTED NOT DETECTED Final   Candida glabrata NOT DETECTED NOT DETECTED Final   Candida krusei NOT DETECTED NOT DETECTED Final   Candida parapsilosis NOT DETECTED NOT DETECTED Final   Candida tropicalis NOT DETECTED NOT DETECTED Final   Cryptococcus neoformans/gattii NOT DETECTED NOT DETECTED Final    Comment: Performed at [facility], [address] [place], [place] [address]  Culture, blood (Routine x 2)     Status: None   Collection Time: 02/28/20 12:20 PM   Specimen: BLOOD LEFT HAND  Result Value Ref Range Status   Specimen Description   Final    BLOOD LEFT HAND Performed at [facility], [address] [place], [place] [address]    Special Requests   Final    BOTTLES DRAWN AEROBIC AND ANAEROBIC Blood Culture adequate volume Performed at [facility], [address] [place], [place] [address]    Culture   Final    NO GROWTH 5 DAYS Performed at [facility], [place] [address] [place],  [address]    Report Status 03/04/2020 FINAL  Final  Respiratory Panel by RT PCR (Flu A&B, Covid) - Nasopharyngeal Swab     Status: None   Collection Time: 02/28/20 12:29 PM   Specimen: Nasopharyngeal Swab  Result Value Ref Range Status   SARS Coronavirus 2 by RT PCR NEGATIVE NEGATIVE Final    Comment: (NOTE) SARS-CoV-2 target nucleic acids are NOT DETECTED.  The SARS-CoV-2 RNA is generally detectable in upper respiratoy specimens during the acute phase of infection. The lowest concentration of SARS-CoV-2 viral copies this assay can detect is 131 copies/mL. A negative result does not preclude SARS-Cov-2 infection and should not be used as the sole basis for treatment or other patient management decisions. A negative result may  occur with  improper specimen collection/handling, submission of specimen other than nasopharyngeal swab, presence of viral mutation(s) within the areas targeted by  this assay, and inadequate number of viral copies (<131 copies/mL). A negative result must be combined with clinical observations, patient history, and epidemiological information. The expected result is Negative.  Fact Sheet for Patients:  PinkCheek.be  Fact Sheet for Healthcare Providers:  GravelBags.it  This test is no t yet approved or cleared by the [place] FDA and  has been authorized for detection and/or diagnosis of SARS-CoV-2 by FDA under an Emergency Use Authorization (EUA). This EUA will remain  in effect (meaning this test can be used) for the duration of the COVID-19 declaration under Section 564(b)(1) of the Act, 21 U.S.C. section 360bbb-3(b)(1), unless the authorization is terminated or revoked sooner.     Influenza A by PCR NEGATIVE NEGATIVE Final   Influenza B by PCR NEGATIVE NEGATIVE Final    Comment: (NOTE) The Xpert Xpress SARS-CoV-2/FLU/RSV assay is intended as an aid in  the diagnosis of influenza from Nasopharyngeal swab specimens and  should not be used as a sole basis for treatment. Nasal washings and  aspirates are unacceptable for Xpert Xpress SARS-CoV-2/FLU/RSV  testing.  Fact Sheet for Patients: PinkCheek.be  Fact Sheet for Healthcare Providers: GravelBags.it  This test is not yet approved or cleared by the [place] FDA and  has been authorized for detection and/or diagnosis of SARS-CoV-2 by  FDA under an Emergency Use Authorization (EUA). This EUA will remain  in effect (meaning this test can be used) for the duration of the  Covid-19 declaration under Section 564(b)(1) of the Act, 21  U.S.C. section 360bbb-3(b)(1), unless the authorization is  terminated or  revoked. Performed at [facility], [address] [place], [place] [address]   Surgical pcr screen     Status: None   Collection Time: 03/02/20  8:59 AM   Specimen: Nasal Mucosa; Nasal Swab  Result Value Ref Range Status   MRSA, PCR NEGATIVE NEGATIVE Final   Staphylococcus aureus NEGATIVE NEGATIVE Final    Comment: (NOTE) The Xpert SA Assay (FDA approved for NASAL specimens in patients 46 years of age and older), is one component of a comprehensive surveillance program. It is not intended to diagnose infection nor to guide or monitor treatment. Performed at [facility], [place] [address], [place], [place] [address]   Aerobic/Anaerobic Culture (surgical/deep wound)     Status: None (Preliminary result)   Collection Time: 03/02/20  3:37 PM   Specimen: Soft Tissue, Other  Result Value Ref Range Status   Specimen Description TISSUE LEFT FOOT  Final   Special Requests PT ON ROCEPHIN  Final   Gram Stain   Final    NO WBC SEEN ABUNDANT GRAM POSITIVE COCCI RARE GRAM NEGATIVE RODS Performed at [facility], [place] [address] [place], [place] [address]    Culture   Final    FEW PSEUDOMONAS AERUGINOSA SUSCEPTIBILITIES TO FOLLOW MIXED ANAEROBIC FLORA PRESENT.  CALL LAB IF FURTHER IID REQUIRED.    Report Status PENDING  Incomplete  Aerobic/Anaerobic Culture (surgical/deep wound)     Status: None   Collection Time: 03/02/20  3:41 PM   Specimen: Soft Tissue, Other  Result Value Ref Range Status   Specimen Description TISSUE LEFT FOOT  Final   Special Requests SAMPLE B DEEP TISSUE PT ON ROCEPHIN  Final   Gram Stain NO WBC SEEN RARE GRAM POSITIVE COCCI   Final   Culture   Final    RARE ACTINOMYCES ODONTOLYTICUS RARE STREPTOCOCCUS ANGINOSIS FEW PARABACTEROIDES DISTASONIS BETA LACTAMASE POSITIVE Performed  at [facility], [place] [address], [place], [place] [address]    Report Status 03/05/2020 FINAL  Final  Culture, blood (routine x 2)     Status: None  (Preliminary result)   Collection Time: 03/03/20  1:02 PM   Specimen: BLOOD  Result Value Ref Range Status   Specimen Description BLOOD RIGHT ANTECUBITAL  Final   Special Requests   Final    BOTTLES DRAWN AEROBIC AND ANAEROBIC Blood Culture adequate volume   Culture   Final    NO GROWTH 2 DAYS Performed at [facility], [place] [address] [place], [place] [address]    Report Status PENDING  Incomplete  Culture, blood (routine x 2)     Status: None (Preliminary result)   Collection Time: 03/03/20  1:14 PM   Specimen: BLOOD  Result Value Ref Range Status   Specimen Description BLOOD LEFT ANTECUBITAL  Final   Special Requests   Final    BOTTLES DRAWN AEROBIC AND ANAEROBIC Blood Culture adequate volume   Culture   Final    NO GROWTH 2 DAYS Performed at [facility], [place] [address], [place], [place] [address]    Report Status PENDING  Incomplete         Radiology Studies: No results found.      Scheduled Meds: . amLODipine  5 mg Oral Daily  . heparin  7,500 Units Subcutaneous Q8H  . insulin aspart  0-5 Units Subcutaneous QHS  . insulin aspart  0-9 Units Subcutaneous TID WC  . multivitamin with minerals  1 tablet Oral Daily  . pantoprazole  40 mg Oral Daily  . Ensure Max Protein  11 oz Oral TID   Continuous Infusions: . sodium chloride 10 mL/hr at 03/04/20 1840  . DAPTOmycin (CUBICIN)  IV 650 mg (03/05/20 2018)  . lactated ringers    . lactated ringers 10 mL/hr at 03/04/20 1538  . lactated ringers    . piperacillin-tazobactam (ZOSYN)  IV 3.375 g (03/06/20 9169)    Assessment & Plan:   Principal Problem:   Severe sepsis with acute organ dysfunction (HCC) Active Problems:   Hyponatremia   Diabetic polyneuropathy associated with type 2 diabetes mellitus ([place])   Acute kidney failure (HCC)   Diabetic ulcer of left foot (HCC)   Infection of left foot   Necrotizing fasciitis ([place])   Subacute osteomyelitis, left ankle and foot ([place])   Acute osteomyelitis  of left foot ([place])   1-Sepsis: POA Secondary  Gangrene left foot with osteomyelitis fifth and fourth metatarsal with necrotizing fasciitis and abscess. Group C strep bacteremia. -Continue with daptomycin. (Vancomycin was discontinued due to severe AKI) -Blood cultures grew with group B strep. -Dr. Sharol Given was consulted.  He recommended foot salvage intervention with fourth and fifth ray amputation.  Patient had a second opinion by Dr. Ginette Pitman who also recommended surgery. -ABIs normal. -Patient underwent left foot fourth and fifth ray amputation, extensive excisional debridement of the skin and soft tissue muscle and fascia application of wound VAC on 11/17. -Repeated debridement of left foot on 11/19.  -Appreciate ID evaluation. -ECHO no evidence of vegetation.  -Culture growing few pseudomonas, Actinomyces, streptococcus. Antibiotics change to Zosyn 11/19. -Lower extremity ultrasound due to leg edema, pending.  -Dr Sharol Given is planning to go back to OR on Wednesday, options transmetatarsal amputation for partial foot salvage or transtibial amputation this will depend on Viability of tissue.   2-AKI: Creatinine baseline 1.6 from 7/21st.  Patient presented with a  creatinine at 3.5 Suspected to be ATN from sepsis 11/21-creatinine improving, at 1.68 today Continue IV fluids Monitor labs   3-Obesity, type 2 diabetes:  BMI 44, weight loss was encouraged  A1c 6.4, blood glucose levels controlled Continue R-ISS    4-Abnormal chest x-ray, right upper lobe infiltrate Initial chest x-ray . Repeat x-ray negative  Respiratory panel negative including Covid  No symptoms reported  5-abdominal pain-resolved KUB negative for obstruction Had BM Holding laxatives reported diarrhea Continue PPI   6.Social;  Does not have a PCP, care management has arranged follow-up with wellness clinic.   7-Hyponatremia;  Resolved.   8-Anemia;  Hb range 9--7.9 for last 3 days.  Hb at 9 (11 months  ago).  Received one unit PRBC 11/17 anemia panel; iron deficiency anemia.  Plan to start oral iron when infection sources controlled Hb stable.   9-HTN-started on Norvasc, BP better we will continue to monitor      DVT prophylaxis: Heparin Code Status: Full Family Communication: Care discussed with patient  Status is: Inpatient  Remains inpatient appropriate because:IV treatments appropriate due to intensity of illness or inability to take PO   Dispo: The patient is from: Home              Anticipated d/c is to: TBD              Anticipated d/c date is: 3 days              Patient currently is not medically stable to d/c. Patient is going back to the OR, needs IV antibiotics            LOS: 7 days   Time spent: 35 minutes with more than 50% on [place], MD [facility] Pager [phone]  If 7PM-7AM, please contact night-coverage [email or web address] Password [email or web address] 03/06/2020, 8:06 AM pro

## 2020-03-06 NOTE — Plan of Care (Signed)
  Problem: Activity: Goal: Risk for activity intolerance will decrease Outcome: Progressing   

## 2020-03-06 NOTE — Progress Notes (Signed)
Pharmacy Antibiotic Note  Ronnie Shaw is a 39 y.o. male with history of prior R-TMA who presented on 02/28/2020 with L-DFI/abscess, ortho now planning toe amputations. Pharmacy has been consulted for Cefepime and to transition Vancomycin to Daptomycin with AKI.  The patient is noted to be in AKI (BL 1.2-1.6, admit 3.58) - SCr down to 2.33, nCrCl~40-45 ml/min. Current Daptomycin dosing remains appropriate. Dapto approved by ID ([place]), using adjusted body weight due to BMI 44  CK noted to be elevated at 979 on 11/15 at baseline prior to Daptomycin, likely related to DFI/abscess.   Pt is now on D8 of total abx. His ceftriaxone was recent changed to zosyn d/t pseudomonas and actinomyces in cultures. His CK was down to wnl on 11/19  Plan: - Continue Daptomycin 650 mg (~6 mg/kg AdjBW) every 24 hours  - Recheck CK in AM - Continue zosyn 3.375g IV q8  Height: 6\' 1"  (185.4 cm) Weight: (!) 168.2 kg (370 lb 13 oz) IBW/kg (Calculated) : 79.9  Temp (24hrs), Avg:98.8 F (37.1 C), Min:98.1 F (36.7 C), Max:99.6 F (37.6 C)  Recent Labs  Lab 02/28/20 1140 02/28/20 1540 02/29/20 0714 03/02/20 0048 03/03/20 0127 03/04/20 1036 03/05/20 1205 03/06/20 0436  WBC 16.2*  --    < > 15.9* 19.6* 17.7* 17.0* 17.6*  CREATININE 3.58*  --    < > 2.35* 2.33* 1.83* 1.72* 1.68*  LATICACIDVEN 1.5 1.6  --   --   --   --   --   --    < > = values in this interval not displayed.    Estimated Creatinine Clearance: 98.1 mL/min (A) (by C-G formula based on SCr of 1.68 mg/dL (H)).    Allergies  Allergen Reactions  . Prednisone Other (See Comments)    "makes me go blind", "that's how I got cataracts".    Antimicrobials this admission: Vanc 11/14>> 11/15 CTX x 1 11/14; restart 11/15>11/18 Flagyl po 11/14>> 11/16 Cefepime 11/14>> 11/15 Dapto 11/15 >> Zosyn 11/19>>  Dose adjustments this admission: N/A  Microbiology results: 11/14 Fluid >> neg 11/14 BCx >> 1 of 4 Group C Strep (pan-S except  R-clinda/erythromycin) 11/17 L-foot tissue cx (sample A) >> pseudomonas 11/17 L-foot tissue cx (sample B) >> actinomyces, strep anginosis 11/18 blood>>ngtd  12/18, PharmD, [place], AAHIVP, CPP Infectious Disease Pharmacist 03/06/2020 9:22 AM

## 2020-03-06 NOTE — Progress Notes (Signed)
   03/06/20 0211  Provider Notification  Provider Name/Title Dr. Antionette Char  Date Provider Notified 03/06/20  Time Provider Notified 0211  Notification Type Page  Notification Reason Requested by patient/family (increasing cough - at times productive )  Response No new orders  Date of Provider Response 03/06/20   Patient c/o increasing cough that is waking him from sleep.  He states it is intermittently productive.  Respiratory rate 18 - 21.  97% - 98% on RA.  BS clear but decreased at bases.  He refused Robitussin DM, stating he did not think it would help.  Reviewed 11/17 chest xray results with him and that it showed nothing acute.  He states he is concerned why his cough is persisting.  Dr. Antionette Char made aware of patient's concerns.  No new orders at this time.  When I went back to patient's room to tell him that we would defer to dayshift MD, he was asleep and not coughing.  Will continue to monitor patient.  Bernie Covey RN

## 2020-03-07 ENCOUNTER — Other Ambulatory Visit: Payer: Self-pay | Admitting: Physician Assistant

## 2020-03-07 ENCOUNTER — Encounter (HOSPITAL_COMMUNITY): Payer: Self-pay | Admitting: Orthopedic Surgery

## 2020-03-07 DIAGNOSIS — E871 Hypo-osmolality and hyponatremia: Secondary | ICD-10-CM

## 2020-03-07 DIAGNOSIS — N179 Acute kidney failure, unspecified: Secondary | ICD-10-CM

## 2020-03-07 LAB — CK: Total CK: 138 U/L (ref 49–397)

## 2020-03-07 LAB — GLUCOSE, CAPILLARY
Glucose-Capillary: 129 mg/dL — ABNORMAL HIGH (ref 70–99)
Glucose-Capillary: 130 mg/dL — ABNORMAL HIGH (ref 70–99)
Glucose-Capillary: 134 mg/dL — ABNORMAL HIGH (ref 70–99)
Glucose-Capillary: 118 mg/dL — ABNORMAL HIGH (ref 70–99)
Glucose-Capillary: 148 mg/dL — ABNORMAL HIGH (ref 70–99)

## 2020-03-07 MED ORDER — JUVEN PO PACK
1.0000 | PACK | Freq: Two times a day (BID) | ORAL | Status: DC
  Administered 2020-03-08 – 2020-03-09 (×3): 1 via ORAL
  Filled 2020-03-07 (×5): qty 1

## 2020-03-07 MED ORDER — AMLODIPINE BESYLATE 10 MG PO TABS
10.0000 mg | ORAL_TABLET | Freq: Every day | ORAL | Status: DC
  Administered 2020-03-08 – 2020-03-11 (×4): 10 mg via ORAL
  Filled 2020-03-07 (×4): qty 1
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
## 2020-03-07 NOTE — Progress Notes (Signed)
Nutrition Follow-up  DOCUMENTATION CODES:   Morbid obesity  INTERVENTION:  -1 packet Juven BID, each packet provides 95 calories, 2.5 grams of protein (collagen), and 9.8 grams of carbohydrate (3 grams sugar); also contains 7 grams of L-arginine and L-glutamine, 300 mg vitamin C, 15 mg vitamin E, 1.2 mcg vitamin B-12, 9.5 mg zinc, 200 mg calcium, and 1.5 g  Calcium Beta-hydroxy-Beta-methylbutyrate to support wound healing -Continue Ensure Max po TID, each supplement provides 150 kcal and 30 grams of protein -Continue MVI daily   NUTRITION DIAGNOSIS:   Increased nutrient needs related to wound healing as evidenced by estimated needs.  ongoing  GOAL:   Patient will meet greater than or equal to 90% of their needs  progressing  MONITOR:   PO intake, Supplement acceptance, Skin, Weight trends, Labs, I & O's  REASON FOR ASSESSMENT:   Consult Wound healing  ASSESSMENT:   Pt with PMH significant for type 2 DM and diabetic foot ulcers s/p R midfoot amputation admitted with severe sepsis with AKI 2/2 diabetic foot ulcer.   11/17 s/p L foot 4th and 5th ray amputation; extensive excisional debridement of skin and soft tissue muscle and fascia; application of wound VAC 11/19 s/p repeat debridement L foot  Per MD, installation wound VAC is functioning well. Plan for pt to go to OR on Wednesday -- options are transmetatarsal amputation with wound closure vs transtibial amputation.   Pt with good appetite with meal completion noted to be 50-100% x last 8 recorded meals (73% average meal intake). Pt receiving Ensure Max TID which he is consuming well per RN.   UOP: x24 hours Wound VAC: output x24 hours  Labs: Na 132 (L), Cr 1.68 (H, trending down), CBGs 129-143 Medications: ss novolog, mvi, protonix  Diet Order:   Diet Order            Diet Carb Modified Fluid consistency: Thin; Room service appropriate? Yes  Diet effective now                 EDUCATION NEEDS:    Education needs have been addressed  Skin:  Skin Assessment: Skin Integrity Issues: Skin Integrity Issues:: Other (Comment), Incisions, Diabetic Ulcer Diabetic Ulcer: L foot Incisions: L foot x2 Other: Non-pressure wound R foot previous transmet amputation  Last BM:  11/22  Height:   Ht Readings from Last 1 Encounters:  03/04/20 6\' 1"  (1.854 m)    Weight:   Wt Readings from Last 1 Encounters:  03/06/20 (!) 168.2 kg    Ideal Body Weight:  83.64 kg  BMI:  Body mass index is 48.92 kg/m.  Estimated Nutritional Needs:   Kcal:  2700-2900  Protein:  190-210 grams  Fluid:  >2.7L/d    03/08/20, MS, RD, LDN RD pager number and weekend/on-call pager number located in Am[employer].

## 2020-03-07 NOTE — Progress Notes (Signed)
Orthopedic Tech Progress Note Patient Details:  Ronnie Shaw [birth year] [id number]  Ortho Devices Type of Ortho Device: Darco shoe Ortho Device/Splint Location: Right Lower Extremity Ortho Device/Splint Interventions: Ordered,Delivered to patients room   Post Interventions Instructions Provided: Adjustment of device, Care of device, Poper ambulation with device   Gerald Stabs 03/07/2020, 2:14 PM

## 2020-03-07 NOTE — Anesthesia Postprocedure Evaluation (Signed)
Anesthesia Post Note  Patient: Saw Mendenhall  Procedure(s) Performed: REPEAT DEBRIDEMENT LEFT FOOT (Left )     Patient location during evaluation: PACU Anesthesia Type: General Level of consciousness: awake and alert Pain management: pain level controlled Vital Signs Assessment: post-procedure vital signs reviewed and stable Respiratory status: spontaneous breathing, nonlabored ventilation, respiratory function stable and patient connected to nasal cannula oxygen Cardiovascular status: blood pressure returned to baseline and stable Postop Assessment: no apparent nausea or vomiting Anesthetic complications: no   No complications documented.  Last Vitals:  Vitals:   03/06/20 0859 03/06/20 2110  BP: 135/86 (!) 168/89  Pulse: 92 96  Resp: 20 18  Temp: 37.1 C   SpO2: 100% 100%    Last Pain:  Vitals:   03/06/20 2145  TempSrc:   PainSc: Asleep                 Shelton Silvas

## 2020-03-07 NOTE — Progress Notes (Signed)
Patient ID: Ronnie Shaw, male   DOB: 11-09-1980, 39 y.o.   MRN: [id number] Installation wound VAC is functioning well discussed with the patient recommendation to proceed with surgical intervention on Wednesday.  Discussed options include a transmetatarsal amputation with wound closure versus transtibial amputation.  Patient's white blood cell count has been increasing yesterday to 17.6.

## 2020-03-07 NOTE — Progress Notes (Signed)
PROGRESS NOTE    Ronnie Shaw  NVB:[id number] DOB: [birth year] DOA: 02/28/2020 PCP: Gildardo Pounds, NP    Brief Narrative:  39 year old with past medical history significant for obesity, type 2 diabetes, diabetic foot ulcer with right foot transmetatarsal amputation in 2020 presents to the ED with fevers, chills, nausea, vomiting and cough and worsening pain in his left foot. In the emergency room he was febrile, met sepsis criteria, work-up was concerning for necrotic wound on his left foot and acute kidney injury creatinine of 3.5. -Admitted with sepsis, necrotic diabetic foot ulcer and acute kidney injury. -Underwent  left foot fourth and fifth ray amputation, extensive excisional debridement of the skin and soft tissue muscle and fascia application of wound VAC on 11/17. Repeat debridement of left Foot on 11/19. -Dr Sharol Given is planning to go back to OR on Wednesday, options transmetatarsal amputation for partial foot salvage or transtibial amputation this will depend on Viability of tissue.   11/21-no new culture growth. Antibiotic duration will depend on whether or not the foot is salvageable.  11/22- no overnight issues. PT recommends CIR. Orthopedics installation wound VAC functioning.    Consultants:   ID, orthopedics  Procedures: Left foot debridement, wound VAC  Antimicrobials:   Daptomycin, Zosyn   Subjective: Eating breakfast, has no complaints. Pain controlled.  Objective: Vitals:   03/06/20 0202 03/06/20 0545 03/06/20 0859 03/06/20 2110  BP:  (!) 153/92 135/86 (!) 168/89  Pulse:  82 92 96  Resp: '19 19 20 18  ' Temp:  98.1 F (36.7 C) 98.8 F (37.1 C)   TempSrc:  Oral Oral   SpO2: 97% 96% 100% 100%  Weight:    (!) 168.2 kg  Height:        Intake/Output Summary (Last 24 hours) at 03/07/2020 0824 Last data filed at 03/07/2020 0600 Gross per 24 hour  Intake 1122.99 ml  Output 1225 ml  Net -102.01 ml   Filed Weights   03/04/20 1421 03/04/20 2137  03/06/20 2110  Weight: (!) 168.2 kg (!) 168.2 kg (!) 168.2 kg    Examination:  Calm, nad cta no w/r/r Regular s1/s2 no murmur Soft benign, +bs +edema Lt foot wrapped [place]3, grossly intact      Data Reviewed: I have personally reviewed following labs and imaging studies  CBC: Recent Labs  Lab 03/02/20 0048 03/02/20 0048 03/02/20 1828 03/03/20 0127 03/04/20 1036 03/05/20 1205 03/06/20 0436  WBC 15.9*  --   --  19.6* 17.7* 17.0* 17.6*  HGB 7.5*   < > 8.0* 8.2* 8.1* 7.9* 7.6*  HCT 23.2*   < > 24.9* 25.7* 25.8* 25.3* 24.5*  MCV 84.4  --   --  85.4 86.9 88.8 87.5  PLT 289  --   --  336 416* 448* 427*   < > = values in this interval not displayed.   Basic Metabolic Panel: Recent Labs  Lab 03/02/20 0048 03/03/20 0127 03/04/20 1036 03/05/20 1205 03/06/20 0436  NA 130* 131* 135 133* 132*  K 4.2 5.1 4.5 4.7 4.6  CL 101 103 104 103 102  CO2 18* 18* 19* 20* 22  GLUCOSE 150* 209* 160* 135* 131*  BUN 44* 51* 60* 48* 43*  CREATININE 2.35* 2.33* 1.83* 1.72* 1.68*  CALCIUM 8.1* 8.4* 8.6* 8.3* 8.2*   GFR: Estimated Creatinine Clearance: 98.1 mL/min (A) (by C-G formula based on SCr of 1.68 mg/dL (H)). Liver Function Tests: No results for input(s): AST, ALT, ALKPHOS, BILITOT, PROT, ALBUMIN in the last 168 hours. No  results for input(s): LIPASE, AMYLASE in the last 168 hours. No results for input(s): AMMONIA in the last 168 hours. Coagulation Profile: No results for input(s): INR, PROTIME in the last 168 hours. Cardiac Enzymes: Recent Labs  Lab 02/29/20 1206 03/04/20 0246 03/07/20 0602  CKTOTAL 979* 273 138   BNP (last 3 results) No results for input(s): PROBNP in the last 8760 hours. HbA1C: No results for input(s): HGBA1C in the last 72 hours. CBG: Recent Labs  Lab 03/05/20 2149 03/06/20 0702 03/06/20 1126 03/06/20 1642 03/06/20 2110  GLUCAP 148* 129* 137* 139* 143*   Lipid Profile: No results for input(s): CHOL, HDL, LDLCALC, TRIG, CHOLHDL, LDLDIRECT in  the last 72 hours. Thyroid Function Tests: No results for input(s): TSH, T4TOTAL, FREET4, T3FREE, THYROIDAB in the last 72 hours. Anemia Panel: No results for input(s): VITAMINB12, FOLATE, FERRITIN, TIBC, IRON, RETICCTPCT in the last 72 hours. Sepsis Labs: No results for input(s): PROCALCITON, LATICACIDVEN in the last 168 hours.  Recent Results (from the past 240 hour(s))  Culture, blood (Routine x 2)     Status: Abnormal   Collection Time: 02/28/20 11:40 AM   Specimen: BLOOD  Result Value Ref Range Status   Specimen Description   Final    BLOOD RIGHT ANTECUBITAL Performed at [facility], [place] [address], [place], [place] [address]    Special Requests   Final    BOTTLES DRAWN AEROBIC AND ANAEROBIC Blood Culture adequate volume Performed at [facility], [address] [place], [place] [address]    Culture  Setup Time   Final    GRAM POSITIVE COCCI IN CHAINS ANAEROBIC BOTTLE ONLY CRITICAL RESULT CALLED TO, READ BACK BY AND VERIFIED WITH: Earmon Phoenix [phone] FCP Performed at [facility], [place] [address] [place], [place] [address]    Culture STREPTOCOCCUS GROUP C (A)  Final   Report Status 03/02/2020 FINAL  Final   Organism ID, Bacteria STREPTOCOCCUS GROUP C  Final      Susceptibility   Streptococcus group c - MIC*    CLINDAMYCIN >=1 RESISTANT Resistant     AMPICILLIN <=0.25 SENSITIVE Sensitive     ERYTHROMYCIN >=8 RESISTANT Resistant     VANCOMYCIN 0.5 SENSITIVE Sensitive     CEFTRIAXONE 0.25 SENSITIVE Sensitive     LEVOFLOXACIN <=0.25 SENSITIVE Sensitive     PENICILLIN Value in next row Sensitive      SENSITIVE<=0.06    * STREPTOCOCCUS GROUP C  Blood Culture ID Panel (Reflexed)     Status: Abnormal   Collection Time: 02/28/20 11:40 AM  Result Value Ref Range Status   Enterococcus faecalis NOT DETECTED NOT DETECTED Final   Enterococcus Faecium NOT DETECTED NOT DETECTED Final   Listeria monocytogenes NOT DETECTED NOT DETECTED Final    Staphylococcus species NOT DETECTED NOT DETECTED Final   Staphylococcus aureus (BCID) NOT DETECTED NOT DETECTED Final   Staphylococcus epidermidis NOT DETECTED NOT DETECTED Final   Staphylococcus lugdunensis NOT DETECTED NOT DETECTED Final   Streptococcus species DETECTED (A) NOT DETECTED Final    Comment: Not Enterococcus species, Streptococcus agalactiae, Streptococcus pyogenes, or Streptococcus pneumoniae. CRITICAL RESULT CALLED TO, READ BACK BY AND VERIFIED WITH: PHARMD H. VONDOHLEN [phone] FCP    Streptococcus agalactiae NOT DETECTED NOT DETECTED Final   Streptococcus pneumoniae NOT DETECTED NOT DETECTED Final   Streptococcus pyogenes NOT DETECTED NOT DETECTED Final   A.calcoaceticus-baumannii NOT DETECTED NOT DETECTED Final   Bacteroides fragilis NOT DETECTED NOT DETECTED Final  Enterobacterales NOT DETECTED NOT DETECTED Final   Enterobacter cloacae complex NOT DETECTED NOT DETECTED Final   Escherichia coli NOT DETECTED NOT DETECTED Final   Klebsiella aerogenes NOT DETECTED NOT DETECTED Final   Klebsiella oxytoca NOT DETECTED NOT DETECTED Final   Klebsiella pneumoniae NOT DETECTED NOT DETECTED Final   Proteus species NOT DETECTED NOT DETECTED Final   Salmonella species NOT DETECTED NOT DETECTED Final   Serratia marcescens NOT DETECTED NOT DETECTED Final   Haemophilus influenzae NOT DETECTED NOT DETECTED Final   Neisseria meningitidis NOT DETECTED NOT DETECTED Final   Pseudomonas aeruginosa NOT DETECTED NOT DETECTED Final   Stenotrophomonas maltophilia NOT DETECTED NOT DETECTED Final   Candida albicans NOT DETECTED NOT DETECTED Final   Candida auris NOT DETECTED NOT DETECTED Final   Candida glabrata NOT DETECTED NOT DETECTED Final   Candida krusei NOT DETECTED NOT DETECTED Final   Candida parapsilosis NOT DETECTED NOT DETECTED Final   Candida tropicalis NOT DETECTED NOT DETECTED Final   Cryptococcus neoformans/gattii NOT DETECTED NOT DETECTED Final    Comment: Performed at  [facility], [place] [address] [place], [place] [address]  Culture, blood (Routine x 2)     Status: None   Collection Time: 02/28/20 12:20 PM   Specimen: BLOOD LEFT HAND  Result Value Ref Range Status   Specimen Description   Final    BLOOD LEFT HAND Performed at [facility], [address] [place], [place] [address]    Special Requests   Final    BOTTLES DRAWN AEROBIC AND ANAEROBIC Blood Culture adequate volume Performed at [facility], [address] [place], [place] [address]    Culture   Final    NO GROWTH 5 DAYS Performed at [facility], [place] [address], [place], [place] [address]    Report Status 03/04/2020 FINAL  Final  Respiratory Panel by RT PCR (Flu A&B, Covid) - Nasopharyngeal Swab     Status: None   Collection Time: 02/28/20 12:29 PM   Specimen: Nasopharyngeal Swab  Result Value Ref Range Status   SARS Coronavirus 2 by RT PCR NEGATIVE NEGATIVE Final    Comment: (NOTE) SARS-CoV-2 target nucleic acids are NOT DETECTED.  The SARS-CoV-2 RNA is generally detectable in upper respiratoy specimens during the acute phase of infection. The lowest concentration of SARS-CoV-2 viral copies this assay can detect is 131 copies/mL. A negative result does not preclude SARS-Cov-2 infection and should not be used as the sole basis for treatment or other patient management decisions. A negative result may occur with  improper specimen collection/handling, submission of specimen other than nasopharyngeal swab, presence of viral mutation(s) within the areas targeted by this assay, and inadequate number of viral copies (<131 copies/mL). A negative result must be combined with clinical observations, patient history, and epidemiological information. The expected result is Negative.  Fact Sheet for Patients:  PinkCheek.be  Fact Sheet for Healthcare Providers:  GravelBags.it  This test is no t  yet approved or cleared by the [place] FDA and  has been authorized for detection and/or diagnosis of SARS-CoV-2 by FDA under an Emergency Use Authorization (EUA). This EUA will remain  in effect (meaning this test can be used) for the duration of the COVID-19 declaration under Section 564(b)(1) of the Act, 21 U.S.C. section 360bbb-3(b)(1), unless the authorization is terminated or revoked sooner.     Influenza A by PCR NEGATIVE NEGATIVE Final   Influenza B by PCR NEGATIVE NEGATIVE  Final    Comment: (NOTE) The Xpert Xpress SARS-CoV-2/FLU/RSV assay is intended as an aid in  the diagnosis of influenza from Nasopharyngeal swab specimens and  should not be used as a sole basis for treatment. Nasal washings and  aspirates are unacceptable for Xpert Xpress SARS-CoV-2/FLU/RSV  testing.  Fact Sheet for Patients: PinkCheek.be  Fact Sheet for Healthcare Providers: GravelBags.it  This test is not yet approved or cleared by the [place] FDA and  has been authorized for detection and/or diagnosis of SARS-CoV-2 by  FDA under an Emergency Use Authorization (EUA). This EUA will remain  in effect (meaning this test can be used) for the duration of the  Covid-19 declaration under Section 564(b)(1) of the Act, 21  U.S.C. section 360bbb-3(b)(1), unless the authorization is  terminated or revoked. Performed at [facility], [address], [place], [place] [address]   Surgical pcr screen     Status: None   Collection Time: 03/02/20  8:59 AM   Specimen: Nasal Mucosa; Nasal Swab  Result Value Ref Range Status   MRSA, PCR NEGATIVE NEGATIVE Final   Staphylococcus aureus NEGATIVE NEGATIVE Final    Comment: (NOTE) The Xpert SA Assay (FDA approved for NASAL specimens in patients 78 years of age and older), is one component of a comprehensive surveillance program. It is not intended to diagnose infection nor to guide or  monitor treatment. Performed at [facility], [place] [address] [place], [place] [address]   Aerobic/Anaerobic Culture (surgical/deep wound)     Status: None   Collection Time: 03/02/20  3:37 PM   Specimen: Soft Tissue, Other  Result Value Ref Range Status   Specimen Description TISSUE LEFT FOOT  Final   Special Requests PT ON ROCEPHIN  Final   Gram Stain   Final    NO WBC SEEN ABUNDANT GRAM POSITIVE COCCI RARE GRAM NEGATIVE RODS Performed at [facility], [place] [address] [place], [place] [address]    Culture   Final    FEW PSEUDOMONAS AERUGINOSA MIXED ANAEROBIC FLORA PRESENT.  CALL LAB IF FURTHER IID REQUIRED.    Report Status 03/06/2020 FINAL  Final   Organism ID, Bacteria PSEUDOMONAS AERUGINOSA  Final      Susceptibility   Pseudomonas aeruginosa - MIC*    CEFTAZIDIME 4 SENSITIVE Sensitive     CIPROFLOXACIN <=0.25 SENSITIVE Sensitive     GENTAMICIN <=1 SENSITIVE Sensitive     IMIPENEM 2 SENSITIVE Sensitive     PIP/TAZO <=4 SENSITIVE Sensitive     CEFEPIME 2 SENSITIVE Sensitive     * FEW PSEUDOMONAS AERUGINOSA  Aerobic/Anaerobic Culture (surgical/deep wound)     Status: None   Collection Time: 03/02/20  3:41 PM   Specimen: Soft Tissue, Other  Result Value Ref Range Status   Specimen Description TISSUE LEFT FOOT  Final   Special Requests SAMPLE B DEEP TISSUE PT ON ROCEPHIN  Final   Gram Stain NO WBC SEEN RARE GRAM POSITIVE COCCI   Final   Culture   Final    RARE ACTINOMYCES ODONTOLYTICUS RARE STREPTOCOCCUS ANGINOSIS FEW PARABACTEROIDES DISTASONIS BETA LACTAMASE POSITIVE Performed at [facility], [place] [address], [place], [place] [address]    Report Status 03/05/2020 FINAL  Final  Culture, blood (routine x 2)     Status: None (Preliminary result)   Collection Time: 03/03/20  1:02 PM   Specimen: BLOOD  Result Value Ref Range Status   Specimen Description BLOOD RIGHT ANTECUBITAL  Final  Special Requests   Final    BOTTLES DRAWN AEROBIC AND ANAEROBIC  Blood Culture adequate volume   Culture   Final    NO GROWTH 3 DAYS Performed at [facility], [place] [address], [place], [place] [address]    Report Status PENDING  Incomplete  Culture, blood (routine x 2)     Status: None (Preliminary result)   Collection Time: 03/03/20  1:14 PM   Specimen: BLOOD  Result Value Ref Range Status   Specimen Description BLOOD LEFT ANTECUBITAL  Final   Special Requests   Final    BOTTLES DRAWN AEROBIC AND ANAEROBIC Blood Culture adequate volume   Culture   Final    NO GROWTH 3 DAYS Performed at [facility], [place] [address], [place], [place] [address]    Report Status PENDING  Incomplete         Radiology Studies: VAS [place] LOWER EXTREMITY VENOUS (DVT)  Result Date: 03/06/2020  Lower Venous DVT Study Indications: Edema. Other Indications: Left foot osteomyelitis with multiple debridements. Limitations: Body habitus, bandages and wound vac, left lower extremity. Comparison Study: No prior study on file Performing Technologist: Sharion Dove RVS  Examination Guidelines: A complete evaluation includes B-mode imaging, spectral Doppler, color Doppler, and power Doppler as needed of all accessible portions of each vessel. Bilateral testing is considered an integral part of a complete examination. Limited examinations for reoccurring indications may be performed as noted. The reflux portion of the exam is performed with the patient in reverse Trendelenburg.  +---------+---------------+---------+-----------+----------+-------------------+ RIGHT    CompressibilityPhasicitySpontaneityPropertiesThrombus Aging      +---------+---------------+---------+-----------+----------+-------------------+ CFV                     Yes      Yes                  patent by color and                                                       Doppler             +---------+---------------+---------+-----------+----------+-------------------+ FV Prox  Full                                                              +---------+---------------+---------+-----------+----------+-------------------+ FV Mid   Full                                                             +---------+---------------+---------+-----------+----------+-------------------+ FV DistalFull                                                             +---------+---------------+---------+-----------+----------+-------------------+ PFV      Full                                                             +---------+---------------+---------+-----------+----------+-------------------+  POP      Full           Yes      Yes                                      +---------+---------------+---------+-----------+----------+-------------------+ PTV      Full                                                             +---------+---------------+---------+-----------+----------+-------------------+ PERO     Full                                                             +---------+---------------+---------+-----------+----------+-------------------+   +---------+---------------+---------+-----------+----------+-------------------+ LEFT     CompressibilityPhasicitySpontaneityPropertiesThrombus Aging      +---------+---------------+---------+-----------+----------+-------------------+ CFV      Full           Yes      Yes                                      +---------+---------------+---------+-----------+----------+-------------------+ SFJ      Full                                                             +---------+---------------+---------+-----------+----------+-------------------+ FV Prox  Full                                                             +---------+---------------+---------+-----------+----------+-------------------+ FV Mid   Full                                                              +---------+---------------+---------+-----------+----------+-------------------+ FV DistalFull                                                             +---------+---------------+---------+-----------+----------+-------------------+ PFV      Full                                                             +---------+---------------+---------+-----------+----------+-------------------+  POP      Full           Yes      Yes                                      +---------+---------------+---------+-----------+----------+-------------------+ PTV                                                   Not well visualized +---------+---------------+---------+-----------+----------+-------------------+ PERO                                                  Not well visualized +---------+---------------+---------+-----------+----------+-------------------+     Summary: RIGHT: - There is no evidence of deep vein thrombosis in the lower extremity. However, portions of this examination were limited- see technologist comments above.  LEFT: - There is no evidence of deep vein thrombosis in the lower extremity. However, portions of this examination were limited- see technologist comments above.  *See table(s) above for measurements and observations. Electronically signed by Deitra Mayo MD on 03/06/2020 at 6:19:43 PM.    Final         Scheduled Meds: . amLODipine  5 mg Oral Daily  . heparin  7,500 Units Subcutaneous Q8H  . insulin aspart  0-5 Units Subcutaneous QHS  . insulin aspart  0-9 Units Subcutaneous TID WC  . multivitamin with minerals  1 tablet Oral Daily  . pantoprazole  40 mg Oral Daily  . Ensure Max Protein  11 oz Oral TID   Continuous Infusions: . sodium chloride 10 mL/hr at 03/04/20 1840  . DAPTOmycin (CUBICIN)  IV 650 mg (03/06/20 2055)  . lactated ringers    . lactated ringers 10 mL/hr at 03/04/20 1538  . lactated ringers    . piperacillin-tazobactam  (ZOSYN)  IV 3.375 g (03/07/20 0109)    Assessment & Plan:   Principal Problem:   Severe sepsis with acute organ dysfunction (HCC) Active Problems:   Hyponatremia   Diabetic polyneuropathy associated with type 2 diabetes mellitus ([place])   Acute kidney failure (HCC)   Diabetic ulcer of left foot (HCC)   Infection of left foot   Necrotizing fasciitis ([place])   Subacute osteomyelitis, left ankle and foot ([place])   Acute osteomyelitis of left foot ([place])   1-Sepsis: POA Secondary  Gangrene left foot with osteomyelitis fifth and fourth metatarsal with necrotizing fasciitis and abscess. Group C strep bacteremia. -Continue with daptomycin. (Vancomycin was discontinued due to severe AKI) -Blood cultures grew with group B strep. -Dr. Sharol Given was consulted.  He recommended foot salvage intervention with fourth and fifth ray amputation.  Patient had a second opinion by Dr. Ginette Pitman who also recommended surgery. -ABIs normal. -Patient underwent left foot fourth and fifth ray amputation, extensive excisional debridement of the skin and soft tissue muscle and fascia application of wound VAC on 11/17. -Repeated debridement of left foot on 11/19.  -Appreciate ID evaluation. -ECHO no evidence of vegetation.  -Culture growing few pseudomonas, Actinomyces, streptococcus. Antibiotics change to Zosyn 11/19. -11/22- venous US negative for dvt, portion of exam was limited -installation wound VAC is functioning well. Per Dr. Sharol Given,  plan to go to OR on Wednesday- options include transmetatarsal amputation with wound closure vs transtibial amputation   2-AKI: Creatinine baseline 1.6 from 7/21st.  Patient presented with a creatinine at 3.5 Suspected to be ATN from sepsis 11/22- creatinine improving, today 1.68 Continue IVF    3-Obesity, type 2 diabetes:  BMI 44, weight loss was encouraged  A1c 6.4 11/22-BG controlled Continue RISS   4-Abnormal chest x-ray, right upper lobe infiltrate Initial chest x-ray  . Repeat cxr negative. Respiratory panel negative, including covid asx   5-abdominal pain-resolved KUB negative for obstruction Had BM Holding laxatives reported diarrhea Continue PPI   6.Social;  Does not have a PCP, care management has arranged follow-up with wellness clinic.   7-Hyponatremia;  Resolved.   8-Anemia;  Hb range 9--7.9 for last 3 days.  Hb at 9 (11 months ago).  Received one unit PRBC 11/17 anemia panel; iron deficiency anemia.  Plan to start oral iron when infection sources controlled Hb stable.   9-HTN-has room for improvement. Will increase Norvasc to 59m qd.     DVT prophylaxis: Heparin Code Status: Full Family Communication: Care discussed with patient  Status is: Inpatient  Remains inpatient appropriate because:IV treatments appropriate due to intensity of illness or inability to take PO   Dispo: The patient is from: Home              Anticipated d/c is to: CIR/TBD              Anticipated d/c date is: 3 days              Patient currently is not medically stable to d/c. Patient is going back to the OR on Wednesday, needs IV antibiotics            LOS: 8 days   Time spent: 35 minutes with more than 50% on CPowell[place] MD [facility] Pager [phone]  If 7PM-7AM, please contact night-coverage [email or web address] Password [email or web address]11/22/2021, 8:24 AM

## 2020-03-07 NOTE — Progress Notes (Signed)
[facility]  Date of Admission:  02/28/2020     Total days of antibiotics 9         ASSESSMENT:  Mr. 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 Cultures are polymicrobial with tissue of left foot with pansensitive pseudomonas and secondary culture with actinomyces odontolyticus, streptococcus anginosis and parabacteroides distasonis. Discontinue Daptomycin and continue with Zosyn. Scheduled for surgery on 11/24. Duration of therapy pending surgical outcomes as there is potential for transtibial amputation. Discussed importance of continuing to maintain blood sugars and optimize nutrition to promote healing and reduce risk for complications and further infection.   PLAN:  1. Discontinue daptomycin 2. Continue Zosyn. 3. Awaiting repeat surgery on 11/24.  4. Duration of therapy pending surgical outcome. 5. Optimize nutrition and blood sugar control.   Principal Problem:   Severe sepsis with acute organ dysfunction (HCC) Active Problems:   Hyponatremia   Diabetic polyneuropathy associated with type 2 diabetes mellitus (HCC)   Acute kidney failure (HCC)   Diabetic ulcer of left foot (HCC)   Infection of left foot   Necrotizing fasciitis (HCC)   Subacute osteomyelitis, left ankle and foot (HCC)   Acute osteomyelitis of left foot (HCC)   . amLODipine  5 mg Oral Daily  . heparin  7,500 Units Subcutaneous Q8H  . insulin aspart  0-5 Units Subcutaneous QHS  . insulin aspart  0-9 Units Subcutaneous TID WC  . multivitamin with minerals  1 tablet Oral Daily  . pantoprazole  40 mg Oral Daily  . Ensure Max Protein  11 oz Oral TID    SUBJECTIVE:  Afebrile overnight with no acute events. Frustrated that this was not found earlier and that he will likely have to be here for Thanksgiving.   Allergies  Allergen Reactions  . Prednisone Other (See Comments)    "makes me go blind", "that's how I got cataracts".      Review of Systems: Review of Systems  Constitutional: Negative for chills, fever and weight loss.  Respiratory: Negative for cough, shortness of breath and wheezing.   Cardiovascular: Negative for chest pain and leg swelling.  Gastrointestinal: Negative for abdominal pain, constipation, diarrhea, nausea and vomiting.  Skin: Negative for rash.      OBJECTIVE: Vitals:   03/06/20 0545 03/06/20 0859 03/06/20 2110 03/07/20 0939  BP: (!) 153/92 135/86 (!) 168/89 (!) 168/88  Pulse: 82 92 96 88  Resp: 19 20 18 18   Temp: 98.1 F (36.7 C) 98.8 F (37.1 C)  98.2 F (36.8 C)  TempSrc: Oral Oral    SpO2: 96% 100% 100% 99%  Weight:   (!) 168.2 kg   Height:       Body mass index is 48.92 kg/m.  Physical Exam Constitutional:      General: He is not in acute distress.    Appearance: He is well-developed. He is obese.     Comments: Seated in the chair beside the bed; pleasant  Cardiovascular:     Rate and Rhythm: Normal rate and regular rhythm.     Heart sounds: Normal heart sounds.  Pulmonary:     Effort: Pulmonary effort is normal.     Breath sounds: Normal breath sounds.  Musculoskeletal:     Comments: Left foot wrapped in surgical dressing with wound VAC attached.  Bandage over previous right transmetatarsal amputation.   Skin:    General: Skin is warm and dry.  Neurological:  Mental Status: He is alert and oriented to person, place, and time.  Psychiatric:        Mood and Affect: Mood normal.     Lab Results Lab Results  Component Value Date   WBC 17.6 (H) 03/06/2020   HGB 7.6 (L) 03/06/2020   HCT 24.5 (L) 03/06/2020   MCV 87.5 03/06/2020   PLT 427 (H) 03/06/2020    Lab Results  Component Value Date   CREATININE 1.68 (H) 03/06/2020   BUN 43 (H) 03/06/2020   NA 132 (L) 03/06/2020   K 4.6 03/06/2020   CL 102 03/06/2020   CO2 22 03/06/2020    Lab Results  Component Value Date   ALT 33 02/29/2020   AST 39 02/29/2020   ALKPHOS 84 02/29/2020   BILITOT  0.5 02/29/2020     Microbiology: Recent Results (from the past 240 hour(s))  Culture, blood (Routine x 2)     Status: Abnormal   Collection Time: 02/28/20 11:40 AM   Specimen: BLOOD  Result Value Ref Range Status   Specimen Description   Final    BLOOD RIGHT ANTECUBITAL Performed at [facility], [address] [place], [place] [address]    Special Requests   Final    BOTTLES DRAWN AEROBIC AND ANAEROBIC Blood Culture adequate volume Performed at [facility], [address] [place], [place] [address]    Culture  Setup Time   Final    GRAM POSITIVE COCCI IN CHAINS ANAEROBIC BOTTLE ONLY CRITICAL RESULT CALLED TO, READ BACK BY AND VERIFIED WITH: Cristy Folks [id number] FCP Performed at [facility], [address] [place], [place] [address]    Culture STREPTOCOCCUS GROUP C (A)  Final   Report Status 03/02/2020 FINAL  Final   Organism ID, Bacteria STREPTOCOCCUS GROUP C  Final      Susceptibility   Streptococcus group c - MIC*    CLINDAMYCIN >=1 RESISTANT Resistant     AMPICILLIN <=0.25 SENSITIVE Sensitive     ERYTHROMYCIN >=8 RESISTANT Resistant     VANCOMYCIN 0.5 SENSITIVE Sensitive     CEFTRIAXONE 0.25 SENSITIVE Sensitive     LEVOFLOXACIN <=0.25 SENSITIVE Sensitive     PENICILLIN Value in next row Sensitive      SENSITIVE<=0.06    * STREPTOCOCCUS GROUP C  Blood Culture ID Panel (Reflexed)     Status: Abnormal   Collection Time: 02/28/20 11:40 AM  Result Value Ref Range Status   Enterococcus faecalis NOT DETECTED NOT DETECTED Final   Enterococcus Faecium NOT DETECTED NOT DETECTED Final   Listeria monocytogenes NOT DETECTED NOT DETECTED Final   Staphylococcus species NOT DETECTED NOT DETECTED Final   Staphylococcus aureus (BCID) NOT DETECTED NOT DETECTED Final   Staphylococcus epidermidis NOT DETECTED NOT DETECTED Final   Staphylococcus lugdunensis NOT DETECTED NOT DETECTED Final   Streptococcus species DETECTED (A) NOT DETECTED Final     Comment: Not Enterococcus species, Streptococcus agalactiae, Streptococcus pyogenes, or Streptococcus pneumoniae. CRITICAL RESULT CALLED TO, READ BACK BY AND VERIFIED WITH: PHARMD H. VONDOHLEN [phone] [id number] FCP    Streptococcus agalactiae NOT DETECTED NOT DETECTED Final   Streptococcus pneumoniae NOT DETECTED NOT DETECTED Final   Streptococcus pyogenes NOT DETECTED NOT DETECTED Final   A.calcoaceticus-baumannii NOT DETECTED NOT DETECTED Final   Bacteroides fragilis NOT DETECTED NOT DETECTED Final   Enterobacterales NOT DETECTED NOT DETECTED Final   Enterobacter cloacae complex NOT DETECTED NOT DETECTED Final   Escherichia coli NOT DETECTED NOT  DETECTED Final   Klebsiella aerogenes NOT DETECTED NOT DETECTED Final   Klebsiella oxytoca NOT DETECTED NOT DETECTED Final   Klebsiella pneumoniae NOT DETECTED NOT DETECTED Final   Proteus species NOT DETECTED NOT DETECTED Final   Salmonella species NOT DETECTED NOT DETECTED Final   Serratia marcescens NOT DETECTED NOT DETECTED Final   Haemophilus influenzae NOT DETECTED NOT DETECTED Final   Neisseria meningitidis NOT DETECTED NOT DETECTED Final   Pseudomonas aeruginosa NOT DETECTED NOT DETECTED Final   Stenotrophomonas maltophilia NOT DETECTED NOT DETECTED Final   Candida albicans NOT DETECTED NOT DETECTED Final   Candida auris NOT DETECTED NOT DETECTED Final   Candida glabrata NOT DETECTED NOT DETECTED Final   Candida krusei NOT DETECTED NOT DETECTED Final   Candida parapsilosis NOT DETECTED NOT DETECTED Final   Candida tropicalis NOT DETECTED NOT DETECTED Final   Cryptococcus neoformans/gattii NOT DETECTED NOT DETECTED Final    Comment: Performed at [facility], [address], [place], [place] [address]  Culture, blood (Routine x 2)     Status: None   Collection Time: 02/28/20 12:20 PM   Specimen: BLOOD LEFT HAND  Result Value Ref Range Status   Specimen Description   Final    BLOOD LEFT HAND Performed at [facility], [address] [place], [place] [address]    Special Requests   Final    BOTTLES DRAWN AEROBIC AND ANAEROBIC Blood Culture adequate volume Performed at [facility], [address] [place], [place] [address]    Culture   Final    NO GROWTH 5 DAYS Performed at [facility], [address] [place], [place] [address]    Report Status 03/04/2020 FINAL  Final  Respiratory Panel by RT PCR (Flu A&B, Covid) - Nasopharyngeal Swab     Status: None   Collection Time: 02/28/20 12:29 PM   Specimen: Nasopharyngeal Swab  Result Value Ref Range Status   SARS Coronavirus 2 by RT PCR NEGATIVE NEGATIVE Final    Comment: (NOTE) SARS-CoV-2 target nucleic acids are NOT DETECTED.  The SARS-CoV-2 RNA is generally detectable in upper respiratoy specimens during the acute phase of infection. The lowest concentration of SARS-CoV-2 viral copies this assay can detect is 131 copies/mL. A negative result does not preclude SARS-Cov-2 infection and should not be used as the sole basis for treatment or other patient management decisions. A negative result may occur with  improper specimen collection/handling, submission of specimen other than nasopharyngeal swab, presence of viral mutation(s) within the areas targeted by this assay, and inadequate number of viral copies (<131 copies/mL). A negative result must be combined with clinical observations, patient history, and epidemiological information. The expected result is Negative.  Fact Sheet for Patients:  https://www.moore.com/  Fact Sheet for Healthcare Providers:  https://www.young.biz/  This test is no t yet approved or cleared by the [place] FDA and  has been authorized for detection and/or diagnosis of SARS-CoV-2 by FDA under an Emergency Use Authorization (EUA). This EUA will remain  in effect (meaning this test can be used) for the duration of the COVID-19 declaration under Section  564(b)(1) of the Act, 21 U.S.C. section 360bbb-3(b)(1), unless the authorization is terminated or revoked sooner.     Influenza A by PCR NEGATIVE NEGATIVE Final   Influenza B by PCR NEGATIVE NEGATIVE Final    Comment: (NOTE) The Xpert Xpress SARS-CoV-2/FLU/RSV assay is intended as an aid in  the diagnosis of influenza from  Nasopharyngeal swab specimens and  should not be used as a sole basis for treatment. Nasal washings and  aspirates are unacceptable for Xpert Xpress SARS-CoV-2/FLU/RSV  testing.  Fact Sheet for Patients: https://www.moore.com/  Fact Sheet for Healthcare Providers: https://www.young.biz/  This test is not yet approved or cleared by the [place] FDA and  has been authorized for detection and/or diagnosis of SARS-CoV-2 by  FDA under an Emergency Use Authorization (EUA). This EUA will remain  in effect (meaning this test can be used) for the duration of the  Covid-19 declaration under Section 564(b)(1) of the Act, 21  U.S.C. section 360bbb-3(b)(1), unless the authorization is  terminated or revoked. Performed at [facility], [address], [place], [place] [address]   Surgical pcr screen     Status: None   Collection Time: 03/02/20  8:59 AM   Specimen: Nasal Mucosa; Nasal Swab  Result Value Ref Range Status   MRSA, PCR NEGATIVE NEGATIVE Final   Staphylococcus aureus NEGATIVE NEGATIVE Final    Comment: (NOTE) The Xpert SA Assay (FDA approved for NASAL specimens in patients 110 years of age and older), is one component of a comprehensive surveillance program. It is not intended to diagnose infection nor to guide or monitor treatment. Performed at [facility], [address] [place], [place] [address]   Aerobic/Anaerobic Culture (surgical/deep wound)     Status: None   Collection Time: 03/02/20  3:37 PM   Specimen: Soft Tissue, Other  Result Value Ref Range Status   Specimen Description TISSUE  LEFT FOOT  Final   Special Requests PT ON ROCEPHIN  Final   Gram Stain   Final    NO WBC SEEN ABUNDANT GRAM POSITIVE COCCI RARE GRAM NEGATIVE RODS Performed at [facility], [address], [place], [place] [address]    Culture   Final    FEW PSEUDOMONAS AERUGINOSA MIXED ANAEROBIC FLORA PRESENT.  CALL LAB IF FURTHER IID REQUIRED.    Report Status 03/06/2020 FINAL  Final   Organism ID, Bacteria PSEUDOMONAS AERUGINOSA  Final      Susceptibility   Pseudomonas aeruginosa - MIC*    CEFTAZIDIME 4 SENSITIVE Sensitive     CIPROFLOXACIN <=0.25 SENSITIVE Sensitive     GENTAMICIN <=1 SENSITIVE Sensitive     IMIPENEM 2 SENSITIVE Sensitive     PIP/TAZO <=4 SENSITIVE Sensitive     CEFEPIME 2 SENSITIVE Sensitive     * FEW PSEUDOMONAS AERUGINOSA  Aerobic/Anaerobic Culture (surgical/deep wound)     Status: None   Collection Time: 03/02/20  3:41 PM   Specimen: Soft Tissue, Other  Result Value Ref Range Status   Specimen Description TISSUE LEFT FOOT  Final   Special Requests SAMPLE B DEEP TISSUE PT ON ROCEPHIN  Final   Gram Stain NO WBC SEEN RARE GRAM POSITIVE COCCI   Final   Culture   Final    RARE ACTINOMYCES ODONTOLYTICUS RARE STREPTOCOCCUS ANGINOSIS FEW PARABACTEROIDES DISTASONIS BETA LACTAMASE POSITIVE Performed at [facility], [address] [place], [place] [address]    Report Status 03/05/2020 FINAL  Final  Culture, blood (routine x 2)     Status: None (Preliminary result)   Collection Time: 03/03/20  1:02 PM   Specimen: BLOOD  Result Value Ref Range Status   Specimen Description BLOOD RIGHT ANTECUBITAL  Final   Special Requests   Final    BOTTLES DRAWN AEROBIC AND ANAEROBIC Blood Culture adequate volume   Culture  Final    NO GROWTH 4 DAYS Performed at [facility], [address] [place], [place] [address]    Report Status PENDING  Incomplete  Culture, blood (routine x 2)     Status: None (Preliminary result)   Collection Time: 03/03/20  1:14 PM    Specimen: BLOOD  Result Value Ref Range Status   Specimen Description BLOOD LEFT ANTECUBITAL  Final   Special Requests   Final    BOTTLES DRAWN AEROBIC AND ANAEROBIC Blood Culture adequate volume   Culture   Final    NO GROWTH 4 DAYS Performed at [facility], [address] [place], [place] [address]    Report Status PENDING  Incomplete     Marcos EkeGreg Glenola Wheat, NP [facility]  03/07/2020  12:54 PM
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
## 2020-03-08 ENCOUNTER — Other Ambulatory Visit: Payer: Self-pay | Admitting: Physician Assistant

## 2020-03-08 LAB — CULTURE, BLOOD (ROUTINE X 2)
Culture: NO GROWTH
Culture: NO GROWTH
Special Requests: ADEQUATE
Special Requests: ADEQUATE

## 2020-03-08 LAB — CBC
HCT: 23.5 % — ABNORMAL LOW (ref 39.0–52.0)
Hemoglobin: 7.3 g/dL — ABNORMAL LOW (ref 13.0–17.0)
MCH: 27.3 pg (ref 26.0–34.0)
MCHC: 31.1 g/dL (ref 30.0–36.0)
MCV: 88 fL (ref 80.0–100.0)
Platelets: 426 10*3/uL — ABNORMAL HIGH (ref 150–400)
RBC: 2.67 MIL/uL — ABNORMAL LOW (ref 4.22–5.81)
RDW: 15.9 % — ABNORMAL HIGH (ref 11.5–15.5)
WBC: 13.5 10*3/uL — ABNORMAL HIGH (ref 4.0–10.5)
nRBC: 0.1 % (ref 0.0–0.2)

## 2020-03-08 LAB — GLUCOSE, CAPILLARY
Glucose-Capillary: 105 mg/dL — ABNORMAL HIGH (ref 70–99)
Glucose-Capillary: 133 mg/dL — ABNORMAL HIGH (ref 70–99)
Glucose-Capillary: 101 mg/dL — ABNORMAL HIGH (ref 70–99)
Glucose-Capillary: 126 mg/dL — ABNORMAL HIGH (ref 70–99)

## 2020-03-08 LAB — BASIC METABOLIC PANEL
Anion gap: 12 (ref 5–15)
BUN: 37 mg/dL — ABNORMAL HIGH (ref 6–20)
CO2: 19 mmol/L — ABNORMAL LOW (ref 22–32)
Calcium: 8.6 mg/dL — ABNORMAL LOW (ref 8.9–10.3)
Chloride: 104 mmol/L (ref 98–111)
Creatinine, Ser: 1.53 mg/dL — ABNORMAL HIGH (ref 0.61–1.24)
GFR, Estimated: 60 mL/min — ABNORMAL LOW (ref >60)
Glucose, Bld: 147 mg/dL — ABNORMAL HIGH (ref 70–99)
Potassium: 4.9 mmol/L (ref 3.5–5.1)
Sodium: 135 mmol/L (ref 135–145)

## 2020-03-08 LAB — PREPARE RBC (CROSSMATCH)

## 2020-03-08 MED ORDER — DEXTROSE 5 % IV SOLN
3.0000 g | INTRAVENOUS | Status: AC
  Administered 2020-03-09: 3 g via INTRAVENOUS
  Filled 2020-03-08 (×3): qty 3000

## 2020-03-08 MED ORDER — ENSURE PRE-SURGERY PO LIQD
296.0000 mL | Freq: Once | ORAL | Status: AC
  Administered 2020-03-09: 296 mL via ORAL
  Filled 2020-03-08: qty 296

## 2020-03-08 MED ORDER — SODIUM CHLORIDE 0.9% IV SOLUTION: Freq: Once | INTRAVENOUS | Status: AC

## 2020-03-08 NOTE — Progress Notes (Signed)
Patient ID: Ronnie Shaw, male   DOB: 1980/05/25, 39 y.o.   MRN: [id number] Patient is status post foot salvage intervention he still has a persistent elevated white cell count of 13.5 decreasing hemoglobin to 7.3 with no active bleeding from the wound.  His renal function continues to improve creatinine to 1.53.  Discussed with the patient treatment options with a attempted midfoot salvage surgery versus transtibial amputation.  Discussed with the patient's difficulty with healing of the midfoot amputation on the right hand with there being less viable soft tissue on the left foot with no interval healing after the previous debridements I feel his best option is to proceed with a transtibial amputation.  Patient states he understands and wishes to proceed with the below the knee amputation.  We will plan for surgery tomorrow anticipate patient may require skilled nursing placement or inpatient rehab.

## 2020-03-08 NOTE — Plan of Care (Signed)
  Problem: Health Behavior/Discharge Planning: Goal: Ability to manage health-related needs will improve Outcome: Progressing   Problem: Clinical Measurements: Goal: Will remain free from infection Outcome: Progressing Goal: Diagnostic test results will improve Outcome: Progressing   Problem: Coping: Goal: Level of anxiety will decrease Outcome: Progressing   Problem: Elimination: Goal: Will not experience complications related to bowel motility Outcome: Progressing Goal: Will not experience complications related to urinary retention Outcome: Progressing   Problem: Skin Integrity: Goal: Risk for impaired skin integrity will decrease Outcome: Progressing   Problem: Education: Goal: Knowledge of the prescribed therapeutic regimen will improve Outcome: Progressing Goal: Ability to verbalize activity precautions or restrictions will improve Outcome: Progressing Goal: Understanding of discharge needs will improve Outcome: Progressing   Problem: Clinical Measurements: Goal: Postoperative complications will be avoided or minimized Outcome: Progressing   Problem: Self-Care: Goal: Ability to meet self-care needs will improve Outcome: Progressing

## 2020-03-08 NOTE — Progress Notes (Signed)
PROGRESS NOTE    Ronnie Shaw  WUJ:[id number] DOB: 1981/04/16 DOA: 02/28/2020 PCP: Gildardo Pounds, NP    Brief Narrative:  39 year old with past medical history significant for obesity, type 2 diabetes, diabetic foot ulcer with right foot transmetatarsal amputation in 2020 presents to the ED with fevers, chills, nausea, vomiting and cough and worsening pain in his left foot. In the emergency room he was febrile, met sepsis criteria, work-up was concerning for necrotic wound on his left foot and acute kidney injury creatinine of 3.5. -Admitted with sepsis, necrotic diabetic foot ulcer and acute kidney injury. -Underwent  left foot fourth and fifth ray amputation, extensive excisional debridement of the skin and soft tissue muscle and fascia application of wound VAC on 11/17. Repeat debridement of left Foot on 11/19. -Dr Sharol Given is planning to go back to OR on Wednesday, options transmetatarsal amputation for partial foot salvage or transtibial amputation this will depend on Viability of tissue.   11/21-no new culture growth. Antibiotic duration will depend on whether or not the foot is salvageable.  11/22- no overnight issues. PT recommends CIR. Orthopedics installation wound VAC functioning.    Consultants:   ID, orthopedics  Procedures: Left foot debridement, wound VAC  Antimicrobials:   Daptomycin, Zosyn   Subjective: Has no complaints.  Minimal pain.  Objective: Vitals:   03/07/20 0939 03/07/20 1835 03/07/20 2145 03/08/20 0440  BP: (!) 168/88 (!) 165/91 (!) 152/88 (!) 147/87  Pulse: 88 91 99 87  Resp: _0 Temp: 98.2 F (36.8 C) 98.8 F (37.1 C) 98.2 F (36.8 C) 98.2 F (36.8 C)  TempSrc:  Oral Oral Oral  SpO2: 99% 97% 96% 96%  Weight:      Height:        Intake/Output Summary (Last 24 hours) at 03/08/2020 0756 Last data filed at 03/08/2020 0600 Gross per 24 hour  Intake 3454.11 ml  Output 2850 ml  Net 604.11 ml   Filed Weights   03/04/20 1421  03/04/20 2137 03/06/20 2110  Weight: (!) 168.2 kg (!) 168.2 kg (!) 168.2 kg    Examination: Calm, comfortable CTA no wheezing RRR S1-S2 no gallops Soft benign positive bowel sounds Left lower extremity wrapped in dressing did not open right no edema Alert oriented x3, grossly intact      Data Reviewed: I have personally reviewed following labs and imaging studies  CBC: Recent Labs  Lab 03/03/20 0127 03/04/20 1036 03/05/20 1205 03/06/20 0436 03/08/20 0508  WBC 19.6* 17.7* 17.0* 17.6* 13.5*  HGB 8.2* 8.1* 7.9* 7.6* 7.3*  HCT 25.7* 25.8* 25.3* 24.5* 23.5*  MCV 85.4 86.9 88.8 87.5 88.0  PLT 336 416* 448* 427* 956*   Basic Metabolic Panel: Recent Labs  Lab 03/03/20 0127 03/04/20 1036 03/05/20 1205 03/06/20 0436 03/08/20 0508  NA 131* 135 133* 132* 135  K 5.1 4.5 4.7 4.6 4.9  CL 103 104 103 102 104  CO2 18* 19* 20* 22 19*  GLUCOSE 209* 160* 135* 131* 147*  BUN 51* 60* 48* 43* 37*  CREATININE 2.33* 1.83* 1.72* 1.68* 1.53*  CALCIUM 8.4* 8.6* 8.3* 8.2* 8.6*   GFR: Estimated Creatinine Clearance: 107.7 mL/min (A) (by C-G formula based on SCr of 1.53 mg/dL (H)). Liver Function Tests: No results for input(s): AST, ALT, ALKPHOS, BILITOT, PROT, ALBUMIN in the last 168 hours. No results for input(s): LIPASE, AMYLASE in the last 168 hours. No results for input(s): AMMONIA in the last 168 hours. Coagulation Profile: No results for input(s): INR,  PROTIME in the last 168 hours. Cardiac Enzymes: Recent Labs  Lab 03/04/20 0246 03/07/20 0602  CKTOTAL 273 138   BNP (last 3 results) No results for input(s): PROBNP in the last 8760 hours. HbA1C: No results for input(s): HGBA1C in the last 72 hours. CBG: Recent Labs  Lab 03/07/20 1228 03/07/20 1641 03/07/20 2040 03/07/20 2226 03/08/20 0639  GLUCAP 130* 134* 118* 148* 105*   Lipid Profile: No results for input(s): CHOL, HDL, LDLCALC, TRIG, CHOLHDL, LDLDIRECT in the last 72 hours. Thyroid Function Tests: No results  for input(s): TSH, T4TOTAL, FREET4, T3FREE, THYROIDAB in the last 72 hours. Anemia Panel: No results for input(s): VITAMINB12, FOLATE, FERRITIN, TIBC, IRON, RETICCTPCT in the last 72 hours. Sepsis Labs: No results for input(s): PROCALCITON, LATICACIDVEN in the last 168 hours.  Recent Results (from the past 240 hour(s))  Culture, blood (Routine x 2)     Status: Abnormal   Collection Time: 02/28/20 11:40 AM   Specimen: BLOOD  Result Value Ref Range Status   Specimen Description   Final    BLOOD RIGHT ANTECUBITAL Performed at [facility], [place] [address], [place], [place] [address]    Special Requests   Final    BOTTLES DRAWN AEROBIC AND ANAEROBIC Blood Culture adequate volume Performed at [facility], [address] [place], [place] [address]    Culture  Setup Time   Final    GRAM POSITIVE COCCI IN CHAINS ANAEROBIC BOTTLE ONLY CRITICAL RESULT CALLED TO, READ BACK BY AND VERIFIED WITH: Earmon Phoenix [phone] FCP Performed at [facility], [place] [address] [place], [place] [address]    Culture STREPTOCOCCUS GROUP C (A)  Final   Report Status 03/02/2020 FINAL  Final   Organism ID, Bacteria STREPTOCOCCUS GROUP C  Final      Susceptibility   Streptococcus group c - MIC*    CLINDAMYCIN >=1 RESISTANT Resistant     AMPICILLIN <=0.25 SENSITIVE Sensitive     ERYTHROMYCIN >=8 RESISTANT Resistant     VANCOMYCIN 0.5 SENSITIVE Sensitive     CEFTRIAXONE 0.25 SENSITIVE Sensitive     LEVOFLOXACIN <=0.25 SENSITIVE Sensitive     PENICILLIN Value in next row Sensitive      SENSITIVE<=0.06    * STREPTOCOCCUS GROUP C  Blood Culture ID Panel (Reflexed)     Status: Abnormal   Collection Time: 02/28/20 11:40 AM  Result Value Ref Range Status   Enterococcus faecalis NOT DETECTED NOT DETECTED Final   Enterococcus Faecium NOT DETECTED NOT DETECTED Final   Listeria monocytogenes NOT DETECTED NOT DETECTED Final   Staphylococcus species NOT DETECTED NOT DETECTED Final    Staphylococcus aureus (BCID) NOT DETECTED NOT DETECTED Final   Staphylococcus epidermidis NOT DETECTED NOT DETECTED Final   Staphylococcus lugdunensis NOT DETECTED NOT DETECTED Final   Streptococcus species DETECTED (A) NOT DETECTED Final    Comment: Not Enterococcus species, Streptococcus agalactiae, Streptococcus pyogenes, or Streptococcus pneumoniae. CRITICAL RESULT CALLED TO, READ BACK BY AND VERIFIED WITH: PHARMD H. VONDOHLEN [phone] FCP    Streptococcus agalactiae NOT DETECTED NOT DETECTED Final   Streptococcus pneumoniae NOT DETECTED NOT DETECTED Final   Streptococcus pyogenes NOT DETECTED NOT DETECTED Final   A.calcoaceticus-baumannii NOT DETECTED NOT DETECTED Final   Bacteroides fragilis NOT DETECTED NOT DETECTED Final   Enterobacterales NOT DETECTED NOT DETECTED Final   Enterobacter cloacae complex NOT DETECTED NOT DETECTED Final   Escherichia coli NOT DETECTED NOT DETECTED Final   Klebsiella aerogenes NOT  DETECTED NOT DETECTED Final   Klebsiella oxytoca NOT DETECTED NOT DETECTED Final   Klebsiella pneumoniae NOT DETECTED NOT DETECTED Final   Proteus species NOT DETECTED NOT DETECTED Final   Salmonella species NOT DETECTED NOT DETECTED Final   Serratia marcescens NOT DETECTED NOT DETECTED Final   Haemophilus influenzae NOT DETECTED NOT DETECTED Final   Neisseria meningitidis NOT DETECTED NOT DETECTED Final   Pseudomonas aeruginosa NOT DETECTED NOT DETECTED Final   Stenotrophomonas maltophilia NOT DETECTED NOT DETECTED Final   Candida albicans NOT DETECTED NOT DETECTED Final   Candida auris NOT DETECTED NOT DETECTED Final   Candida glabrata NOT DETECTED NOT DETECTED Final   Candida krusei NOT DETECTED NOT DETECTED Final   Candida parapsilosis NOT DETECTED NOT DETECTED Final   Candida tropicalis NOT DETECTED NOT DETECTED Final   Cryptococcus neoformans/gattii NOT DETECTED NOT DETECTED Final    Comment: Performed at [facility], [place] [address] [place], [place]  [address]  Culture, blood (Routine x 2)     Status: None   Collection Time: 02/28/20 12:20 PM   Specimen: BLOOD LEFT HAND  Result Value Ref Range Status   Specimen Description   Final    BLOOD LEFT HAND Performed at [facility], [place] [place], [place] [address]    Special Requests   Final    BOTTLES DRAWN AEROBIC AND ANAEROBIC Blood Culture adequate volume Performed at [facility], [address] [place], [place] [address]    Culture   Final    NO GROWTH 5 DAYS Performed at [facility], [place] [address] [place], [place] [address]    Report Status 03/04/2020 FINAL  Final  Respiratory Panel by RT PCR (Flu A&B, Covid) - Nasopharyngeal Swab     Status: None   Collection Time: 02/28/20 12:29 PM   Specimen: Nasopharyngeal Swab  Result Value Ref Range Status   SARS Coronavirus 2 by RT PCR NEGATIVE NEGATIVE Final    Comment: (NOTE) SARS-CoV-2 target nucleic acids are NOT DETECTED.  The SARS-CoV-2 RNA is generally detectable in upper respiratoy specimens during the acute phase of infection. The lowest concentration of SARS-CoV-2 viral copies this assay can detect is 131 copies/mL. A negative result does not preclude SARS-Cov-2 infection and should not be used as the sole basis for treatment or other patient management decisions. A negative result may occur with  improper specimen collection/handling, submission of specimen other than nasopharyngeal swab, presence of viral mutation(s) within the areas targeted by this assay, and inadequate number of viral copies (<131 copies/mL). A negative result must be combined with clinical observations, patient history, and epidemiological information. The expected result is Negative.  Fact Sheet for Patients:  PinkCheek.be  Fact Sheet for Healthcare Providers:  GravelBags.it  This test is no t yet approved or cleared by the [place] FDA and    has been authorized for detection and/or diagnosis of SARS-CoV-2 by FDA under an Emergency Use Authorization (EUA). This EUA will remain  in effect (meaning this test can be used) for the duration of the COVID-19 declaration under Section 564(b)(1) of the Act, 21 U.S.C. section 360bbb-3(b)(1), unless the authorization is terminated or revoked sooner.     Influenza A by PCR NEGATIVE NEGATIVE Final   Influenza B by PCR NEGATIVE NEGATIVE Final    Comment: (NOTE) The Xpert Xpress SARS-CoV-2/FLU/RSV assay is intended as an aid in  the diagnosis of influenza from Nasopharyngeal swab specimens and  should  not be used as a sole basis for treatment. Nasal washings and  aspirates are unacceptable for Xpert Xpress SARS-CoV-2/FLU/RSV  testing.  Fact Sheet for Patients: PinkCheek.be  Fact Sheet for Healthcare Providers: GravelBags.it  This test is not yet approved or cleared by the [place] FDA and  has been authorized for detection and/or diagnosis of SARS-CoV-2 by  FDA under an Emergency Use Authorization (EUA). This EUA will remain  in effect (meaning this test can be used) for the duration of the  Covid-19 declaration under Section 564(b)(1) of the Act, 21  U.S.C. section 360bbb-3(b)(1), unless the authorization is  terminated or revoked. Performed at [facility], [address], [place], [place] [address]   Surgical pcr screen     Status: None   Collection Time: 03/02/20  8:59 AM   Specimen: Nasal Mucosa; Nasal Swab  Result Value Ref Range Status   MRSA, PCR NEGATIVE NEGATIVE Final   Staphylococcus aureus NEGATIVE NEGATIVE Final    Comment: (NOTE) The Xpert SA Assay (FDA approved for NASAL specimens in patients 38 years of age and older), is one component of a comprehensive surveillance program. It is not intended to diagnose infection nor to guide or monitor treatment. Performed at [facility], [place] [address] [place], [place] [address]   Aerobic/Anaerobic Culture (surgical/deep wound)     Status: None   Collection Time: 03/02/20  3:37 PM   Specimen: Soft Tissue, Other  Result Value Ref Range Status   Specimen Description TISSUE LEFT FOOT  Final   Special Requests PT ON ROCEPHIN  Final   Gram Stain   Final    NO WBC SEEN ABUNDANT GRAM POSITIVE COCCI RARE GRAM NEGATIVE RODS Performed at [facility], [place] [address] [place], [place] [address]    Culture   Final    FEW PSEUDOMONAS AERUGINOSA MIXED ANAEROBIC FLORA PRESENT.  CALL LAB IF FURTHER IID REQUIRED.    Report Status 03/06/2020 FINAL  Final   Organism ID, Bacteria PSEUDOMONAS AERUGINOSA  Final      Susceptibility   Pseudomonas aeruginosa - MIC*    CEFTAZIDIME 4 SENSITIVE Sensitive     CIPROFLOXACIN <=0.25 SENSITIVE Sensitive     GENTAMICIN <=1 SENSITIVE Sensitive     IMIPENEM 2 SENSITIVE Sensitive     PIP/TAZO <=4 SENSITIVE Sensitive     CEFEPIME 2 SENSITIVE Sensitive     * FEW PSEUDOMONAS AERUGINOSA  Aerobic/Anaerobic Culture (surgical/deep wound)     Status: None   Collection Time: 03/02/20  3:41 PM   Specimen: Soft Tissue, Other  Result Value Ref Range Status   Specimen Description TISSUE LEFT FOOT  Final   Special Requests SAMPLE B DEEP TISSUE PT ON ROCEPHIN  Final   Gram Stain NO WBC SEEN RARE GRAM POSITIVE COCCI   Final   Culture   Final    RARE ACTINOMYCES ODONTOLYTICUS RARE STREPTOCOCCUS ANGINOSIS FEW PARABACTEROIDES DISTASONIS BETA LACTAMASE POSITIVE Performed at [facility], [place] [address], [place], [place] [address]    Report Status 03/05/2020 FINAL  Final  Culture, blood (routine x 2)     Status: None   Collection Time: 03/03/20  1:02 PM   Specimen: BLOOD  Result Value Ref Range Status   Specimen Description BLOOD RIGHT ANTECUBITAL  Final   Special Requests   Final    BOTTLES DRAWN AEROBIC AND ANAEROBIC Blood Culture adequate volume   Culture   Final    NO GROWTH 5  DAYS Performed at [facility], [place] [address], [place], [place] [address]    Report Status 03/08/2020 FINAL  Final  Culture, blood (routine x 2)     Status: None   Collection Time: 03/03/20  1:14 PM   Specimen: BLOOD  Result Value Ref Range Status   Specimen Description BLOOD LEFT ANTECUBITAL  Final   Special Requests   Final    BOTTLES DRAWN AEROBIC AND ANAEROBIC Blood Culture adequate volume   Culture   Final    NO GROWTH 5 DAYS Performed at [facility], [place] [address], [place], [place] [address]    Report Status 03/08/2020 FINAL  Final         Radiology Studies: VAS [place] LOWER EXTREMITY VENOUS (DVT)  Result Date: 03/06/2020  Lower Venous DVT Study Indications: Edema. Other Indications: Left foot osteomyelitis with multiple debridements. Limitations: Body habitus, bandages and wound vac, left lower extremity. Comparison Study: No prior study on file Performing Technologist: Sharion Dove RVS  Examination Guidelines: A complete evaluation includes B-mode imaging, spectral Doppler, color Doppler, and power Doppler as needed of all accessible portions of each vessel. Bilateral testing is considered an integral part of a complete examination. Limited examinations for reoccurring indications may be performed as noted. The reflux portion of the exam is performed with the patient in reverse Trendelenburg.  +---------+---------------+---------+-----------+----------+-------------------+ RIGHT    CompressibilityPhasicitySpontaneityPropertiesThrombus Aging      +---------+---------------+---------+-----------+----------+-------------------+ CFV                     Yes      Yes                  patent by color and                                                       Doppler             +---------+---------------+---------+-----------+----------+-------------------+ FV Prox  Full                                                              +---------+---------------+---------+-----------+----------+-------------------+ FV Mid   Full                                                             +---------+---------------+---------+-----------+----------+-------------------+ FV DistalFull                                                             +---------+---------------+---------+-----------+----------+-------------------+ PFV      Full                                                             +---------+---------------+---------+-----------+----------+-------------------+  POP      Full           Yes      Yes                                      +---------+---------------+---------+-----------+----------+-------------------+ PTV      Full                                                             +---------+---------------+---------+-----------+----------+-------------------+ PERO     Full                                                             +---------+---------------+---------+-----------+----------+-------------------+   +---------+---------------+---------+-----------+----------+-------------------+ LEFT     CompressibilityPhasicitySpontaneityPropertiesThrombus Aging      +---------+---------------+---------+-----------+----------+-------------------+ CFV      Full           Yes      Yes                                      +---------+---------------+---------+-----------+----------+-------------------+ SFJ      Full                                                             +---------+---------------+---------+-----------+----------+-------------------+ FV Prox  Full                                                             +---------+---------------+---------+-----------+----------+-------------------+ FV Mid   Full                                                             +---------+---------------+---------+-----------+----------+-------------------+ FV  DistalFull                                                             +---------+---------------+---------+-----------+----------+-------------------+ PFV      Full                                                             +---------+---------------+---------+-----------+----------+-------------------+  POP      Full           Yes      Yes                                      +---------+---------------+---------+-----------+----------+-------------------+ PTV                                                   Not well visualized +---------+---------------+---------+-----------+----------+-------------------+ PERO                                                  Not well visualized +---------+---------------+---------+-----------+----------+-------------------+     Summary: RIGHT: - There is no evidence of deep vein thrombosis in the lower extremity. However, portions of this examination were limited- see technologist comments above.  LEFT: - There is no evidence of deep vein thrombosis in the lower extremity. However, portions of this examination were limited- see technologist comments above.  *See table(s) above for measurements and observations. Electronically signed by Deitra Mayo MD on 03/06/2020 at 6:19:43 PM.    Final         Scheduled Meds: . sodium chloride   Intravenous Once  . amLODipine  10 mg Oral Daily  . heparin  7,500 Units Subcutaneous Q8H  . insulin aspart  0-5 Units Subcutaneous QHS  . insulin aspart  0-9 Units Subcutaneous TID WC  . multivitamin with minerals  1 tablet Oral Daily  . nutrition supplement (JUVEN)  1 packet Oral BID BM  . pantoprazole  40 mg Oral Daily  . Ensure Max Protein  11 oz Oral TID   Continuous Infusions: . sodium chloride 75 mL/hr at 03/08/20 0647  . lactated ringers    . lactated ringers 10 mL/hr at 03/04/20 1538  . lactated ringers    . piperacillin-tazobactam (ZOSYN)  IV 3.375 g (03/08/20 0545)     Assessment & Plan:   Principal Problem:   Severe sepsis with acute organ dysfunction (HCC) Active Problems:   Hyponatremia   Diabetic polyneuropathy associated with type 2 diabetes mellitus ([place])   Acute kidney failure (HCC)   Diabetic ulcer of left foot (HCC)   Infection of left foot   Necrotizing fasciitis ([place])   Subacute osteomyelitis, left ankle and foot ([place])   Acute osteomyelitis of left foot ([place])   1-Sepsis: POA Secondary  Gangrene left foot with osteomyelitis fifth and fourth metatarsal with necrotizing fasciitis and abscess. Group C strep bacteremia. -Continue with daptomycin. (Vancomycin was discontinued due to severe AKI) -Blood cultures grew with group B strep. Orthopedics Dr. Sharol Given following- recommended foot salvage intervention with fourth and fifth ray amputation.  Patient had a second opinion by Dr. Ginette Pitman who also recommended surgery. -ABIs normal. -Patient underwent left foot fourth and fifth ray amputation, extensive excisional debridement of the skin and soft tissue muscle and fascia application of wound VAC on 11/17. -Repeated debridement of left foot on 11/19.  -Appreciate ID evaluation. -ECHO no evidence of vegetation.  -Culture growing few pseudomonas, Actinomyces, streptococcus. Antibiotics change to Zosyn 11/19. 11/23-venous ultrasound negative for DVT, portion of exam was limited Wbc decreasing Plan  to go to the OR in a.m.-options include transmetatarsal amputation with wound closure versus transtibial amputation ID following-recommend discontinuing daptomycin, continue Zosyn Duration of therapy pending surgical outcome    2-AKI: Creatinine baseline 1.6 from 7/21st.  Patient presented with a creatinine at 3.5 Suspected to be ATN from sepsis 11/23-improving with IV fluids.  Creatinine 1.53 today  Continue to monitor    3-Obesity, type 2 diabetes:  BMI 44, weight loss was encouraged  A1c 6.4 11/23-BG controlled Continue  RISS    4-Abnormal chest x-ray, right upper lobe infiltrate Initial chest x-ray . Repeat cxr negative. Respiratory panel negative, including covid asx   5-abdominal pain-resolved KUB negative for obstruction Laxatives held 2/2 reporting diarrhea. Resolved Continue PPI     6.Social;  Does not have a PCP, care management has arranged follow-up with wellness clinic.   7-Hyponatremia;  Resolved.   8-Anemia;  Hb range 9--7.9 for last 3 days.  Hb at 9 (11 months ago).  Received one unit PRBC 11/17 anemia panel; iron deficiency anemia.  Plan to start oral iron when infection sources controlled Hb stable.   9-HTN-has room for improvement. Will increase Norvasc to 1m qd.     DVT prophylaxis: Heparin Code Status: Full Family Communication: Care discussed with patient  Status is: Inpatient  Remains inpatient appropriate because:IV treatments appropriate due to intensity of illness or inability to take PO   Dispo: The patient is from: Home              Anticipated d/c is to: CIR/TBD              Anticipated d/c date is: 3 days              Patient currently is not medically stable to d/c. Patient is going back to the OR on Wednesday, needs IV antibiotics            LOS: 9 days   Time spent: 35 minutes with more than 50% on CLester Prairie MD [facility] Pager [phone]  If 7PM-7AM, please contact night-coverage [email or web address] Password [email or web address]11/23/2021, 7:56 AM

## 2020-03-08 NOTE — H&P (View-Only) (Signed)
Patient ID: Ronnie Shaw, male   DOB: 02/13/1981, 39 y.o.   MRN: [id number] Patient is status post foot salvage intervention he still has a persistent elevated white cell count of 13.5 decreasing hemoglobin to 7.3 with no active bleeding from the wound.  His renal function continues to improve creatinine to 1.53.  Discussed with the patient treatment options with a attempted midfoot salvage surgery versus transtibial amputation.  Discussed with the patient's difficulty with healing of the midfoot amputation on the right hand with there being less viable soft tissue on the left foot with no interval healing after the previous debridements I feel his best option is to proceed with a transtibial amputation.  Patient states he understands and wishes to proceed with the below the knee amputation.  We will plan for surgery tomorrow anticipate patient may require skilled nursing placement or inpatient rehab. 
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
## 2020-03-09 ENCOUNTER — Inpatient Hospital Stay (HOSPITAL_COMMUNITY): Payer: Self-pay | Admitting: Anesthesiology

## 2020-03-09 ENCOUNTER — Encounter (HOSPITAL_COMMUNITY): Payer: Self-pay | Admitting: Internal Medicine

## 2020-03-09 ENCOUNTER — Telehealth: Payer: Self-pay | Admitting: Orthopedic Surgery

## 2020-03-09 ENCOUNTER — Encounter (HOSPITAL_COMMUNITY)
Admission: EM | Disposition: A | Discharge status: Rehabilitation Facility | Payer: Self-pay | Source: Home / Self Care | Attending: Internal Medicine

## 2020-03-09 DIAGNOSIS — B954 Other streptococcus as the cause of diseases classified elsewhere: Secondary | ICD-10-CM

## 2020-03-09 DIAGNOSIS — R7881 Bacteremia: Secondary | ICD-10-CM

## 2020-03-09 HISTORY — PX: AMPUTATION: SHX166

## 2020-03-09 LAB — POCT I-STAT, CHEM 8
BUN: 34 mg/dL — ABNORMAL HIGH (ref 6–20)
Calcium, Ion: 1.14 mmol/L — ABNORMAL LOW (ref 1.15–1.40)
Chloride: 110 mmol/L (ref 98–111)
Creatinine, Ser: 1.5 mg/dL — ABNORMAL HIGH (ref 0.61–1.24)
Glucose, Bld: 113 mg/dL — ABNORMAL HIGH (ref 70–99)
HCT: 26 % — ABNORMAL LOW (ref 39.0–52.0)
Hemoglobin: 8.8 g/dL — ABNORMAL LOW (ref 13.0–17.0)
Potassium: 5.4 mmol/L — ABNORMAL HIGH (ref 3.5–5.1)
Sodium: 138 mmol/L (ref 135–145)
TCO2: 23 mmol/L (ref 22–32)

## 2020-03-09 LAB — GLUCOSE, CAPILLARY
Glucose-Capillary: 123 mg/dL — ABNORMAL HIGH (ref 70–99)
Glucose-Capillary: 98 mg/dL (ref 70–99)
Glucose-Capillary: 124 mg/dL — ABNORMAL HIGH (ref 70–99)
Glucose-Capillary: 150 mg/dL — ABNORMAL HIGH (ref 70–99)
Glucose-Capillary: 113 mg/dL — ABNORMAL HIGH (ref 70–99)
Glucose-Capillary: 177 mg/dL — ABNORMAL HIGH (ref 70–99)

## 2020-03-09 LAB — CREATININE, SERUM
Creatinine, Ser: 1.5 mg/dL — ABNORMAL HIGH (ref 0.61–1.24)
GFR, Estimated: >60 mL/min (ref >60)

## 2020-03-09 SURGERY — AMPUTATION BELOW KNEE
Anesthesia: General | Site: Knee | Laterality: Left

## 2020-03-09 MED ORDER — ONDANSETRON HCL 4 MG/2ML IJ SOLN: 4.0000 mg | Freq: Four times a day (QID) | INTRAMUSCULAR | Status: DC | PRN

## 2020-03-09 MED ORDER — BUPIVACAINE LIPOSOME 1.3 % IJ SUSP
INTRAMUSCULAR | Status: DC | PRN
  Administered 2020-03-09: 10 mL

## 2020-03-09 MED ORDER — CHLORHEXIDINE GLUCONATE 0.12 % MT SOLN
OROMUCOSAL | Status: AC
  Filled 2020-03-09: qty 15

## 2020-03-09 MED ORDER — FENTANYL CITRATE (PF) 250 MCG/5ML IJ SOLN
INTRAMUSCULAR | Status: AC
  Filled 2020-03-09: qty 5

## 2020-03-09 MED ORDER — MAGNESIUM CITRATE PO SOLN: 1.0000 | Freq: Once | ORAL | Status: DC | PRN

## 2020-03-09 MED ORDER — FENTANYL CITRATE (PF) 100 MCG/2ML IJ SOLN: 25.0000 ug | INTRAMUSCULAR | Status: DC | PRN

## 2020-03-09 MED ORDER — DOCUSATE SODIUM 100 MG PO CAPS
100.0000 mg | ORAL_CAPSULE | Freq: Two times a day (BID) | ORAL | Status: DC
  Administered 2020-03-09 – 2020-03-10 (×3): 100 mg via ORAL
  Filled 2020-03-09 (×3): qty 1

## 2020-03-09 MED ORDER — OXYCODONE HCL 5 MG PO TABS: 5.0000 mg | ORAL_TABLET | ORAL | Status: DC | PRN

## 2020-03-09 MED ORDER — METHOCARBAMOL 1000 MG/10ML IJ SOLN
500.0000 mg | Freq: Four times a day (QID) | INTRAVENOUS | Status: DC | PRN
  Filled 2020-03-09: qty 5

## 2020-03-09 MED ORDER — HYDROMORPHONE HCL 1 MG/ML IJ SOLN: 0.5000 mg | INTRAMUSCULAR | Status: DC | PRN

## 2020-03-09 MED ORDER — ONDANSETRON HCL 4 MG PO TABS: 4.0000 mg | ORAL_TABLET | Freq: Four times a day (QID) | ORAL | Status: DC | PRN

## 2020-03-09 MED ORDER — POLYETHYLENE GLYCOL 3350 17 G PO PACK: 17.0000 g | PACK | Freq: Every day | ORAL | Status: DC | PRN

## 2020-03-09 MED ORDER — ACETAMINOPHEN 500 MG PO TABS
1000.0000 mg | ORAL_TABLET | Freq: Once | ORAL | Status: AC
  Administered 2020-03-09: 1000 mg via ORAL
  Filled 2020-03-09: qty 2

## 2020-03-09 MED ORDER — SODIUM CHLORIDE 0.9 % IV SOLN: INTRAVENOUS | Status: DC

## 2020-03-09 MED ORDER — CELECOXIB 200 MG PO CAPS
200.0000 mg | ORAL_CAPSULE | Freq: Once | ORAL | Status: AC
  Administered 2020-03-09: 200 mg via ORAL
  Filled 2020-03-09 (×2): qty 1

## 2020-03-09 MED ORDER — LACTATED RINGERS IV SOLN: INTRAVENOUS | Status: DC | PRN

## 2020-03-09 MED ORDER — OXYCODONE HCL 5 MG PO TABS
10.0000 mg | ORAL_TABLET | ORAL | Status: DC | PRN
  Administered 2020-03-09 – 2020-03-10 (×5): 15 mg via ORAL
  Filled 2020-03-09 (×5): qty 3

## 2020-03-09 MED ORDER — MIDAZOLAM HCL 2 MG/2ML IJ SOLN
INTRAMUSCULAR | Status: AC
  Administered 2020-03-09: 2 mg via INTRAVENOUS
  Filled 2020-03-09: qty 2

## 2020-03-09 MED ORDER — PROPOFOL 10 MG/ML IV BOLUS
INTRAVENOUS | Status: AC
  Filled 2020-03-09: qty 40

## 2020-03-09 MED ORDER — PROPOFOL 10 MG/ML IV BOLUS
INTRAVENOUS | Status: DC | PRN
  Administered 2020-03-09: 200 mg via INTRAVENOUS
  Administered 2020-03-09 (×2): 100 mg via INTRAVENOUS

## 2020-03-09 MED ORDER — BUPIVACAINE HCL (PF) 0.5 % IJ SOLN
INTRAMUSCULAR | Status: DC | PRN
  Administered 2020-03-09 (×2): 15 mL via PERINEURAL

## 2020-03-09 MED ORDER — MIDAZOLAM HCL 2 MG/2ML IJ SOLN: 2.0000 mg | Freq: Once | INTRAMUSCULAR | Status: AC

## 2020-03-09 MED ORDER — METHOCARBAMOL 500 MG PO TABS
500.0000 mg | ORAL_TABLET | Freq: Four times a day (QID) | ORAL | Status: DC | PRN
  Administered 2020-03-09 – 2020-03-10 (×3): 500 mg via ORAL
  Filled 2020-03-09 (×3): qty 1

## 2020-03-09 MED ORDER — FENTANYL CITRATE (PF) 100 MCG/2ML IJ SOLN
INTRAMUSCULAR | Status: DC | PRN
  Administered 2020-03-09: 50 ug via INTRAVENOUS

## 2020-03-09 MED ORDER — 0.9 % SODIUM CHLORIDE (POUR BTL) OPTIME
TOPICAL | Status: DC | PRN
  Administered 2020-03-09: 1000 mL

## 2020-03-09 MED ORDER — FENTANYL CITRATE (PF) 100 MCG/2ML IJ SOLN: 100.0000 ug | Freq: Once | INTRAMUSCULAR | Status: AC

## 2020-03-09 MED ORDER — PROMETHAZINE HCL 25 MG/ML IJ SOLN: 6.2500 mg | INTRAMUSCULAR | Status: DC | PRN

## 2020-03-09 MED ORDER — FENTANYL CITRATE (PF) 100 MCG/2ML IJ SOLN
INTRAMUSCULAR | Status: AC
  Administered 2020-03-09: 100 ug via INTRAVENOUS
  Filled 2020-03-09: qty 2

## 2020-03-09 MED ORDER — BISACODYL 10 MG RE SUPP: 10.0000 mg | Freq: Every day | RECTAL | Status: DC | PRN

## 2020-03-09 MED ORDER — METOCLOPRAMIDE HCL 5 MG/ML IJ SOLN: 5.0000 mg | Freq: Three times a day (TID) | INTRAMUSCULAR | Status: DC | PRN

## 2020-03-09 MED ORDER — METOCLOPRAMIDE HCL 5 MG PO TABS: 5.0000 mg | ORAL_TABLET | Freq: Three times a day (TID) | ORAL | Status: DC | PRN

## 2020-03-09 MED ORDER — ONDANSETRON HCL 4 MG/2ML IJ SOLN
INTRAMUSCULAR | Status: DC | PRN
  Administered 2020-03-09: 4 mg via INTRAVENOUS

## 2020-03-09 MED ORDER — ACETAMINOPHEN 325 MG PO TABS
325.0000 mg | ORAL_TABLET | Freq: Four times a day (QID) | ORAL | Status: DC | PRN
  Filled 2020-03-09: qty 2

## 2020-03-09 SURGICAL SUPPLY — 40 items
BLADE SAW RECIP 87.9 MT (BLADE) ×2 IMPLANT
BLADE SURG 21 STRL SS (BLADE) ×2 IMPLANT
BNDG COHESIVE 4X5 TAN STRL (GAUZE/BANDAGES/DRESSINGS) ×1 IMPLANT
BNDG COHESIVE 6X5 TAN STRL LF (GAUZE/BANDAGES/DRESSINGS) IMPLANT
CANISTER WOUND CARE 500ML ATS (WOUND CARE) ×2 IMPLANT
COVER SURGICAL LIGHT HANDLE (MISCELLANEOUS) ×2 IMPLANT
COVER WAND RF STERILE (DRAPES) IMPLANT
CUFF TOURN SGL QUICK 34 (TOURNIQUET CUFF) ×2
CUFF TRNQT CYL 34X4.125X (TOURNIQUET CUFF) ×1 IMPLANT
DRAPE DERMATAC (DRAPES) ×3 IMPLANT
DRAPE INCISE IOBAN 66X45 STRL (DRAPES) ×2 IMPLANT
DRAPE U-SHAPE 47X51 STRL (DRAPES) ×2 IMPLANT
DRESSING PREVENA PLUS CUSTOM (GAUZE/BANDAGES/DRESSINGS) ×1 IMPLANT
DRSG PREVENA PLUS CUSTOM (GAUZE/BANDAGES/DRESSINGS) ×2
DURAPREP 26ML APPLICATOR (WOUND CARE) ×2 IMPLANT
ELECT REM PT RETURN 9FT ADLT (ELECTROSURGICAL) ×2
ELECTRODE REM PT RTRN 9FT ADLT (ELECTROSURGICAL) ×1 IMPLANT
GLOVE BIOGEL PI IND STRL 9 (GLOVE) ×1 IMPLANT
GLOVE BIOGEL PI INDICATOR 9 (GLOVE) ×1
GLOVE SURG ORTHO 9.0 STRL STRW (GLOVE) ×2 IMPLANT
GOWN STRL REUS W/ TWL XL LVL3 (GOWN DISPOSABLE) ×2 IMPLANT
GOWN STRL REUS W/TWL XL LVL3 (GOWN DISPOSABLE) ×4
KIT BASIN OR (CUSTOM PROCEDURE TRAY) ×2 IMPLANT
KIT TURNOVER KIT B (KITS) ×2 IMPLANT
MANIFOLD NEPTUNE II (INSTRUMENTS) ×2 IMPLANT
MAT PREVALON FULL STRYKER (MISCELLANEOUS) ×1 IMPLANT
NS IRRIG 1000ML POUR BTL (IV SOLUTION) ×2 IMPLANT
PACK ORTHO EXTREMITY (CUSTOM PROCEDURE TRAY) ×2 IMPLANT
PAD ARMBOARD 7.5X6 YLW CONV (MISCELLANEOUS) ×2 IMPLANT
PREVENA RESTOR ARTHOFORM 46X30 (CANNISTER) ×2 IMPLANT
SPONGE LAP 18X18 RF (DISPOSABLE) IMPLANT
STAPLER VISISTAT 35W (STAPLE) ×1 IMPLANT
STOCKINETTE IMPERVIOUS LG (DRAPES) ×2 IMPLANT
SUT ETHILON 2 0 PSLX (SUTURE) IMPLANT
SUT SILK 2 0 (SUTURE) ×2
SUT SILK 2-0 18XBRD TIE 12 (SUTURE) ×1 IMPLANT
SUT VIC AB 1 CTX 27 (SUTURE) ×4 IMPLANT
TOWEL GREEN STERILE (TOWEL DISPOSABLE) ×2 IMPLANT
TUBE CONNECTING 12X1/4 (SUCTIONS) ×2 IMPLANT
YANKAUER SUCT BULB TIP NO VENT (SUCTIONS) ×2 IMPLANT

## 2020-03-09 NOTE — Interval H&P Note (Signed)
History and Physical Interval Note:  03/09/2020 6:46 AM  Ronnie Shaw  has presented today for surgery, with the diagnosis of Left Foot Osteomyelitis.  The various methods of treatment have been discussed with the patient and family. After consideration of risks, benefits and other options for treatment, the patient has consented to  Procedure(s): AMPUTATION BELOW KNEE (Left) as a surgical intervention.  The patient's history has been reviewed, patient examined, no change in status, stable for surgery.  I have reviewed the patient's chart and labs.  Questions were answered to the patient's satisfaction.     [name]

## 2020-03-09 NOTE — Progress Notes (Signed)
    [facility]   Date of Admission:  02/28/2020     Reason for visit: Follow up on left foot infection  Interval History:  Chart reviewed and patient seen in PACU after his BKA with Dr Lajoyce Corners. Microbiology reviewed with clinical pharmacist.  Patient currently on Pip-Tazo for polymicrobial foot infection which also covers the group C strep from 1 of 4 positive blood cultures on 02/28/20.  TTE was negative for vegetation and now he has undergone definitive surgery for his foot infection.  Will recommend continuing Pip tazo through 03/13/20 as this will adequately treat his bacteremia and as well as foot infection after BKA.  Physical Exam: Blood pressure (!) 151/84, pulse 94, temperature (!) 97.2 F (36.2 C), resp. rate 19, height 6' 0.99" (1.854 m), weight (!) 168.2 kg, SpO2 99 %.  General: patient appears in NAD, in PACU.  S/p Left BKA  Assessment/Plan: # Polymicrobial left foot infection s/p BKA # Group C strep bacteremia  -- continue Pip tazo through 11/28.   -- dispo planning likely to CIR -- please call as needed   Vedia Coffer for Infectious Disease [facility] [phone] pager 03/09/2020, 11:58 AM

## 2020-03-09 NOTE — Progress Notes (Signed)
Orthopedic Tech Progress Note Patient Details:  Ronnie Shaw [birth year] [id number] Called in brace Patient ID: Destyn Schuyler, male   DOB: [birth year], 39 y.o.   MRN: [id number]   Michelle Piper 03/09/2020, 8:46 PM

## 2020-03-09 NOTE — Op Note (Signed)
   Date of Surgery: 03/09/2020  INDICATIONS: [name] is a 39 y.o.-year-old male who has had necrotizing fasciitis abscess and osteomyelitis of his left foot.  Patient has undergone several surgical interventions for foot salvage intervention and despite installation wound VAC and serial debridement patient still has nonviable tissue in the foot.  Patient still has an elevated white cell count his renal function has improved and still has anemia secondary to the infection.  Due to persistent septic changes despite foot salvage debridement patient presents at this time for transtibial amputation.  Risks and benefits were discussed including risk of the wound not healing.  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  PROCEDURE: Transtibial amputation Application of Prevena wound VAC  SURGEON: Lajoyce Corners, M.D.  ANESTHESIA:  general  IV FLUIDS AND URINE: See anesthesia.  ESTIMATED BLOOD LOSS: 300 mL.  COMPLICATIONS: None.  DESCRIPTION OF PROCEDURE: The patient was brought to the operating room and underwent a general anesthetic. After adequate levels of anesthesia were obtained patient's lower extremity was prepped using DuraPrep draped into a sterile field. A timeout was called. The foot was draped out of the sterile field with impervious stockinette. A transverse incision was made 11 cm distal to the tibial tubercle. This curved proximally and a large posterior flap was created. The tibia was transected 1 cm proximal to the skin incision. The fibula was transected just proximal to the tibial incision. The tibia was beveled anteriorly. A large posterior flap was created. The sciatic nerve was pulled cut and allowed to retract. The vascular bundles were suture ligated with 2-0 silk. The deep and superficial fascial layers were closed using #1 Vicryl. The skin was  closed using staples and 2-0 nylon. The wound was covered with a Prevena wound VAC. There was a good suction fit. A prosthetic shrinker was applied. Patient was extubated taken to the PACU in stable condition.   DISCHARGE PLANNING:  Antibiotic duration: Continue IV antibiotics  Weightbearing: Nonweightbearing on the left.  [facility] consulted for limb protector and compression sock  Pain medication: Opioid pathway  Dressing care/ Wound VAC: Wound VAC left lower extremity continue for 1 week  Discharge to: Possible discharge to CIR  Follow-up: In the office 1 week post operative.  Aldean Baker, MD [facility] 11:19 AM

## 2020-03-09 NOTE — Telephone Encounter (Signed)
I called and sw pt and his mother to advise I spoke with Dr. Lajoyce Corners and he left a message for his nurse this morning that he is going to come see the pt after he is finished in the OR today. Voiced understanding and pleased with plan.

## 2020-03-09 NOTE — Transfer of Care (Signed)
Immediate Anesthesia Transfer of Care Note  Patient: Ronnie Shaw  Procedure(s) Performed: AMPUTATION BELOW KNEE (Left Knee)  Patient Location: PACU  Anesthesia Type:General  Level of Consciousness: awake, alert  and oriented  Airway & Oxygen Therapy: Patient Spontanous Breathing  Post-op Assessment: Report given to RN and Post -op Vital signs reviewed and stable  Post vital signs: Reviewed and stable  Last Vitals:  Vitals Value Taken Time  BP 150/91 03/09/20 1125  Temp 36.2 C 03/09/20 1125  Pulse 99 03/09/20 1133  Resp 24 03/09/20 1133  SpO2 96 % 03/09/20 1133  Vitals shown include unvalidated device data.  Last Pain:  Vitals:   03/09/20 1125  TempSrc:   PainSc: Asleep      Patients Stated Pain Goal: 0 (03/07/20 2145)  Complications: No complications documented.

## 2020-03-09 NOTE — Anesthesia Procedure Notes (Signed)
Procedure Name: LMA Insertion Performed by: Macie Burows, CRNA Pre-anesthesia Checklist: Patient identified, Emergency Drugs available, Suction available and Patient being monitored Patient Re-evaluated:Patient Re-evaluated prior to induction Oxygen Delivery Method: Circle system utilized Preoxygenation: Pre-oxygenation with 100% oxygen Induction Type: IV induction Ventilation: Mask ventilation without difficulty, Oral airway inserted - appropriate to patient size and Mask ventilation with difficulty LMA: LMA inserted LMA Size: 5.0 Number of attempts: 1 Airway Equipment and Method: Oral airway Placement Confirmation: positive ETCO2 and breath sounds checked- equal and bilateral Tube secured with: Tape Dental Injury: Teeth and Oropharynx as per pre-operative assessment

## 2020-03-09 NOTE — Telephone Encounter (Signed)
Pt would like a CB in regards to a surgery Dr.Duda performed on 03/09/20; pt states way more was done than expected.  [phone]

## 2020-03-09 NOTE — Anesthesia Procedure Notes (Addendum)
Anesthesia Regional Block: Popliteal block   Pre-Anesthetic Checklist: ,, timeout performed, Correct Patient, Correct Site, Correct Laterality, Correct Procedure, Correct Position, site marked, Risks and benefits discussed,  Surgical consent,  Pre-op evaluation,  At surgeon's request and post-op pain management  Laterality: Left  Prep: chloraprep       Needles:  Injection technique: Single-shot  Needle Type: Echogenic Stimulator Needle          Additional Needles:   Narrative:  Start time: 03/09/2020 10:04 AM End time: 03/09/2020 10:14 AM Injection made incrementally with aspirations every 5 mL.  Performed by: Personally  Anesthesiologist: Heather Roberts, MD  Additional Notes: A functioning IV was confirmed and monitors were applied.  Sterile prep and drape, hand hygiene and sterile gloves were used.  Negative aspiration and test dose prior to incremental administration of local anesthetic. The patient tolerated the procedure well.Ultrasound  guidance: relevant anatomy identified, needle position confirmed, local anesthetic spread visualized around nerve(s), vascular puncture avoided.  Image printed for medical record. ACB supplementation.

## 2020-03-09 NOTE — Progress Notes (Signed)
PROGRESS NOTE    Ronnie Shaw  TIR:[id number] DOB: 06/11/1980 DOA: 02/28/2020 PCP: Claiborne Rigg, NP    Brief Narrative:  39 year old with past medical history significant for obesity, type 2 diabetes, diabetic foot ulcer with right foot transmetatarsal amputation in 2020 presents to the ED with fevers, chills, nausea, vomiting and cough and worsening pain in his left foot. -Admitted with sepsis, necrotic diabetic foot ulcer and acute kidney injury. -Underwent  left foot fourth and fifth ray amputation, extensive excisional debridement of the skin and soft tissue muscle and fascia application of wound VAC on 11/17. Repeat debridement of left Foot on 11/19. -Plan for left BKA today  Assessment and plan  Sepsis, Gangrenous left foot with osteomyelitis fifth and fourth metatarsal with necrotizing fasciitis and abscess. Group C strep bacteremia -Blood cultures grew group C strep, OR cultures were polymicrobial with pansensitive Pseudomonas and repeat culture with actinomyces, strep anginosis and parabacteroides -Initially was on daptomycin and Zosyn, now on Zosyn alone -ABIs were normal -Underwent left foot fourth and fifth ray amputation with extensive excisional debridement of skin soft tissue and fascia on 11/17 followed by repeat debridement on 11/19 -Dr. Lajoyce Corners following closely he was concerned that the patient has less viable soft tissue on the left foot with no interval healing after previous debridements and hence recommended transtibial amputation which was discussed with the patient -Plan for surgery today  AKI: Creatinine baseline 1.6 from July, creatinine on admission was 3.5 in the setting of ATN from sepsis -Creatinine has improved down to 1.5, discontinue IV fluids  Type 2 diabetes mellitus Obesity BMI 44, weight loss was encouraged  A1c 6.4 -CBGs are stable  Hyponatremia;  Resolved.   Anemia;  -Hemoglobin has ranged from the 8-9 range -Transfused one  unit PRBC 11/17, anemia panel; iron deficiency anemia.  -Hemoglobin down to 7.3 today, anticipate need for transfusion in the next 1 to 2 days, will give IV iron as well postop  HTN- -started on Norvasc   DVT prophylaxis: Heparin Code Status: Full Family Communication: Care discussed with patient  Status is: Inpatient  Remains inpatient appropriate because:IV treatments appropriate due to intensity of illness   Dispo: The patient is from: Home  Anticipated d/c is to: CIR/TBD  Anticipated d/c date is: 3 days  Patient currently is not medically stable to d/c. Patient is going back to the OR on Wednesday, needs IV antibiotics  Consultants:   ID, orthopedics  Procedures: Left foot debridement, wound VAC  Antimicrobials:   Daptomycin, Zosyn   Subjective: -Going for surgery today, no events overnight  Objective: Vitals:   03/09/20 1000 03/09/20 1005 03/09/20 1010 03/09/20 1015  BP:      Pulse: 81 92 83 83  Resp: 14 19 15 13   Temp:      TempSrc:      SpO2: 100% 97% 100% 100%  Weight:      Height:        Intake/Output Summary (Last 24 hours) at 03/09/2020 1116 Last data filed at 03/09/2020 1042 Gross per 24 hour  Intake 768 ml  Output 1200 ml  Net -432 ml   Filed Weights   03/04/20 2137 03/06/20 2110 03/09/20 0931  Weight: (!) 168.2 kg (!) 168.2 kg (!) 168.2 kg    Examination: Gen: Awake, Alert, Oriented X 3, no distress, flat affect Lungs: Few basilar rales CVS: S1-S2, regular rate rhythm Abd: soft, Non tender, non distended, BS present Extremities: Left foot with dressing, wound VAC, right foot transmetatarsal amputation  with dressing Skin: no new rashes on exposed skin  Data Reviewed: I have personally reviewed following labs and imaging studies  CBC: Recent Labs  Lab 03/03/20 0127 03/03/20 0127 03/04/20 1036 03/05/20 1205 03/06/20 0436 03/08/20 0508 03/09/20 0945  WBC 19.6*  --  17.7* 17.0* 17.6*  13.5*  --   HGB 8.2*   < > 8.1* 7.9* 7.6* 7.3* 8.8*  HCT 25.7*   < > 25.8* 25.3* 24.5* 23.5* 26.0*  MCV 85.4  --  86.9 88.8 87.5 88.0  --   PLT 336  --  416* 448* 427* 426*  --    < > = values in this interval not displayed.   Basic Metabolic Panel: Recent Labs  Lab 03/03/20 0127 03/03/20 0127 03/04/20 1036 03/04/20 1036 03/05/20 1205 03/06/20 0436 03/08/20 0508 03/09/20 0443 03/09/20 0945  NA 131*   < > 135  --  133* 132* 135  --  138  K 5.1   < > 4.5  --  4.7 4.6 4.9  --  5.4*  CL 103   < > 104  --  103 102 104  --  110  CO2 18*  --  19*  --  20* 22 19*  --   --   GLUCOSE 209*   < > 160*  --  135* 131* 147*  --  113*  BUN 51*   < > 60*  --  48* 43* 37*  --  34*  CREATININE 2.33*   < > 1.83*   < > 1.72* 1.68* 1.53* 1.50* 1.50*  CALCIUM 8.4*  --  8.6*  --  8.3* 8.2* 8.6*  --   --    < > = values in this interval not displayed.   GFR: Estimated Creatinine Clearance: 109.9 mL/min (A) (by C-G formula based on SCr of 1.5 mg/dL (H)). Liver Function Tests: No results for input(s): AST, ALT, ALKPHOS, BILITOT, PROT, ALBUMIN in the last 168 hours. No results for input(s): LIPASE, AMYLASE in the last 168 hours. No results for input(s): AMMONIA in the last 168 hours. Coagulation Profile: No results for input(s): INR, PROTIME in the last 168 hours. Cardiac Enzymes: Recent Labs  Lab 03/04/20 0246 03/07/20 0602  CKTOTAL 273 138   BNP (last 3 results) No results for input(s): PROBNP in the last 8760 hours. HbA1C: No results for input(s): HGBA1C in the last 72 hours. CBG: Recent Labs  Lab 03/08/20 1210 03/08/20 1644 03/08/20 2201 03/09/20 0645 03/09/20 0900  GLUCAP 133* 101* 126* 123* 98   Lipid Profile: No results for input(s): CHOL, HDL, LDLCALC, TRIG, CHOLHDL, LDLDIRECT in the last 72 hours. Thyroid Function Tests: No results for input(s): TSH, T4TOTAL, FREET4, T3FREE, THYROIDAB in the last 72 hours. Anemia Panel: No results for input(s): VITAMINB12, FOLATE,  FERRITIN, TIBC, IRON, RETICCTPCT in the last 72 hours. Sepsis Labs: No results for input(s): PROCALCITON, LATICACIDVEN in the last 168 hours.  Recent Results (from the past 240 hour(s))  Culture, blood (Routine x 2)     Status: Abnormal   Collection Time: 02/28/20 11:40 AM   Specimen: BLOOD  Result Value Ref Range Status   Specimen Description   Final    BLOOD RIGHT ANTECUBITAL Performed at [facility], [address] [place], [place] [address]    Special Requests   Final    BOTTLES DRAWN AEROBIC AND ANAEROBIC Blood Culture adequate volume Performed at [facility], [address] [place], [place] [address]  Culture  Setup Time   Final    GRAM POSITIVE COCCI IN CHAINS ANAEROBIC BOTTLE ONLY CRITICAL RESULT CALLED TO, READ BACK BY AND VERIFIED WITH: Cristy Folks [id number] [id number] FCP Performed at [facility], [address], [place], [place] [address]    Culture STREPTOCOCCUS GROUP C (A)  Final   Report Status 03/02/2020 FINAL  Final   Organism ID, Bacteria STREPTOCOCCUS GROUP C  Final      Susceptibility   Streptococcus group c - MIC*    CLINDAMYCIN >=1 RESISTANT Resistant     AMPICILLIN <=0.25 SENSITIVE Sensitive     ERYTHROMYCIN >=8 RESISTANT Resistant     VANCOMYCIN 0.5 SENSITIVE Sensitive     CEFTRIAXONE 0.25 SENSITIVE Sensitive     LEVOFLOXACIN <=0.25 SENSITIVE Sensitive     PENICILLIN Value in next row Sensitive      SENSITIVE<=0.06    * STREPTOCOCCUS GROUP C  Blood Culture ID Panel (Reflexed)     Status: Abnormal   Collection Time: 02/28/20 11:40 AM  Result Value Ref Range Status   Enterococcus faecalis NOT DETECTED NOT DETECTED Final   Enterococcus Faecium NOT DETECTED NOT DETECTED Final   Listeria monocytogenes NOT DETECTED NOT DETECTED Final   Staphylococcus species NOT DETECTED NOT DETECTED Final   Staphylococcus aureus (BCID) NOT DETECTED NOT DETECTED Final   Staphylococcus epidermidis NOT DETECTED NOT DETECTED Final    Staphylococcus lugdunensis NOT DETECTED NOT DETECTED Final   Streptococcus species DETECTED (A) NOT DETECTED Final    Comment: Not Enterococcus species, Streptococcus agalactiae, Streptococcus pyogenes, or Streptococcus pneumoniae. CRITICAL RESULT CALLED TO, READ BACK BY AND VERIFIED WITH: PHARMD H. VONDOHLEN [phone] [id number] FCP    Streptococcus agalactiae NOT DETECTED NOT DETECTED Final   Streptococcus pneumoniae NOT DETECTED NOT DETECTED Final   Streptococcus pyogenes NOT DETECTED NOT DETECTED Final   A.calcoaceticus-baumannii NOT DETECTED NOT DETECTED Final   Bacteroides fragilis NOT DETECTED NOT DETECTED Final   Enterobacterales NOT DETECTED NOT DETECTED Final   Enterobacter cloacae complex NOT DETECTED NOT DETECTED Final   Escherichia coli NOT DETECTED NOT DETECTED Final   Klebsiella aerogenes NOT DETECTED NOT DETECTED Final   Klebsiella oxytoca NOT DETECTED NOT DETECTED Final   Klebsiella pneumoniae NOT DETECTED NOT DETECTED Final   Proteus species NOT DETECTED NOT DETECTED Final   Salmonella species NOT DETECTED NOT DETECTED Final   Serratia marcescens NOT DETECTED NOT DETECTED Final   Haemophilus influenzae NOT DETECTED NOT DETECTED Final   Neisseria meningitidis NOT DETECTED NOT DETECTED Final   Pseudomonas aeruginosa NOT DETECTED NOT DETECTED Final   Stenotrophomonas maltophilia NOT DETECTED NOT DETECTED Final   Candida albicans NOT DETECTED NOT DETECTED Final   Candida auris NOT DETECTED NOT DETECTED Final   Candida glabrata NOT DETECTED NOT DETECTED Final   Candida krusei NOT DETECTED NOT DETECTED Final   Candida parapsilosis NOT DETECTED NOT DETECTED Final   Candida tropicalis NOT DETECTED NOT DETECTED Final   Cryptococcus neoformans/gattii NOT DETECTED NOT DETECTED Final    Comment: Performed at [facility], [address], [place], [place] [address]  Culture, blood (Routine x 2)     Status: None   Collection Time: 02/28/20 12:20 PM   Specimen: BLOOD LEFT HAND    Result Value Ref Range Status   Specimen Description   Final    BLOOD LEFT HAND Performed at [facility], [address] [place], [place] [address]    Special Requests   Final  BOTTLES DRAWN AEROBIC AND ANAEROBIC Blood Culture adequate volume Performed at [facility], [address] [place], [place] [address]    Culture   Final    NO GROWTH 5 DAYS Performed at [facility], [address], [place], [place] [address]    Report Status 03/04/2020 FINAL  Final  Respiratory Panel by RT PCR (Flu A&B, Covid) - Nasopharyngeal Swab     Status: None   Collection Time: 02/28/20 12:29 PM   Specimen: Nasopharyngeal Swab  Result Value Ref Range Status   SARS Coronavirus 2 by RT PCR NEGATIVE NEGATIVE Final    Comment: (NOTE) SARS-CoV-2 target nucleic acids are NOT DETECTED.  The SARS-CoV-2 RNA is generally detectable in upper respiratoy specimens during the acute phase of infection. The lowest concentration of SARS-CoV-2 viral copies this assay can detect is 131 copies/mL. A negative result does not preclude SARS-Cov-2 infection and should not be used as the sole basis for treatment or other patient management decisions. A negative result may occur with  improper specimen collection/handling, submission of specimen other than nasopharyngeal swab, presence of viral mutation(s) within the areas targeted by this assay, and inadequate number of viral copies (<131 copies/mL). A negative result must be combined with clinical observations, patient history, and epidemiological information. The expected result is Negative.  Fact Sheet for Patients:  https://www.moore.com/  Fact Sheet for Healthcare Providers:  https://www.young.biz/  This test is no t yet approved or cleared by the [place] FDA and  has been authorized for detection and/or diagnosis of SARS-CoV-2 by FDA under an Emergency Use Authorization (EUA). This  EUA will remain  in effect (meaning this test can be used) for the duration of the COVID-19 declaration under Section 564(b)(1) of the Act, 21 U.S.C. section 360bbb-3(b)(1), unless the authorization is terminated or revoked sooner.     Influenza A by PCR NEGATIVE NEGATIVE Final   Influenza B by PCR NEGATIVE NEGATIVE Final    Comment: (NOTE) The Xpert Xpress SARS-CoV-2/FLU/RSV assay is intended as an aid in  the diagnosis of influenza from Nasopharyngeal swab specimens and  should not be used as a sole basis for treatment. Nasal washings and  aspirates are unacceptable for Xpert Xpress SARS-CoV-2/FLU/RSV  testing.  Fact Sheet for Patients: https://www.moore.com/  Fact Sheet for Healthcare Providers: https://www.young.biz/  This test is not yet approved or cleared by the [place] FDA and  has been authorized for detection and/or diagnosis of SARS-CoV-2 by  FDA under an Emergency Use Authorization (EUA). This EUA will remain  in effect (meaning this test can be used) for the duration of the  Covid-19 declaration under Section 564(b)(1) of the Act, 21  U.S.C. section 360bbb-3(b)(1), unless the authorization is  terminated or revoked. Performed at [facility], [address], [place], [place] [address]   Surgical pcr screen     Status: None   Collection Time: 03/02/20  8:59 AM   Specimen: Nasal Mucosa; Nasal Swab  Result Value Ref Range Status   MRSA, PCR NEGATIVE NEGATIVE Final   Staphylococcus aureus NEGATIVE NEGATIVE Final    Comment: (NOTE) The Xpert SA Assay (FDA approved for NASAL specimens in patients 63 years of age and older), is one component of a comprehensive surveillance program. It is not intended to diagnose infection nor to guide or monitor treatment. Performed at [facility], [address], [place], [place] [address]   Aerobic/Anaerobic Culture (surgical/deep wound)     Status:  None   Collection  Time: 03/02/20  3:37 PM   Specimen: Soft Tissue, Other  Result Value Ref Range Status   Specimen Description TISSUE LEFT FOOT  Final   Special Requests PT ON ROCEPHIN  Final   Gram Stain   Final    NO WBC SEEN ABUNDANT GRAM POSITIVE COCCI RARE GRAM NEGATIVE RODS Performed at [facility], [address] [place], [place] [address]    Culture   Final    FEW PSEUDOMONAS AERUGINOSA MIXED ANAEROBIC FLORA PRESENT.  CALL LAB IF FURTHER IID REQUIRED.    Report Status 03/06/2020 FINAL  Final   Organism ID, Bacteria PSEUDOMONAS AERUGINOSA  Final      Susceptibility   Pseudomonas aeruginosa - MIC*    CEFTAZIDIME 4 SENSITIVE Sensitive     CIPROFLOXACIN <=0.25 SENSITIVE Sensitive     GENTAMICIN <=1 SENSITIVE Sensitive     IMIPENEM 2 SENSITIVE Sensitive     PIP/TAZO <=4 SENSITIVE Sensitive     CEFEPIME 2 SENSITIVE Sensitive     * FEW PSEUDOMONAS AERUGINOSA  Aerobic/Anaerobic Culture (surgical/deep wound)     Status: None   Collection Time: 03/02/20  3:41 PM   Specimen: Soft Tissue, Other  Result Value Ref Range Status   Specimen Description TISSUE LEFT FOOT  Final   Special Requests SAMPLE B DEEP TISSUE PT ON ROCEPHIN  Final   Gram Stain NO WBC SEEN RARE GRAM POSITIVE COCCI   Final   Culture   Final    RARE ACTINOMYCES ODONTOLYTICUS RARE STREPTOCOCCUS ANGINOSIS FEW PARABACTEROIDES DISTASONIS BETA LACTAMASE POSITIVE Performed at [facility], [address], [place], [place] [address]    Report Status 03/05/2020 FINAL  Final  Culture, blood (routine x 2)     Status: None   Collection Time: 03/03/20  1:02 PM   Specimen: BLOOD  Result Value Ref Range Status   Specimen Description BLOOD RIGHT ANTECUBITAL  Final   Special Requests   Final    BOTTLES DRAWN AEROBIC AND ANAEROBIC Blood Culture adequate volume   Culture   Final    NO GROWTH 5 DAYS Performed at [facility], [address], [place], [place] [address]    Report Status 03/08/2020 FINAL  Final  Culture,  blood (routine x 2)     Status: None   Collection Time: 03/03/20  1:14 PM   Specimen: BLOOD  Result Value Ref Range Status   Specimen Description BLOOD LEFT ANTECUBITAL  Final   Special Requests   Final    BOTTLES DRAWN AEROBIC AND ANAEROBIC Blood Culture adequate volume   Culture   Final    NO GROWTH 5 DAYS Performed at [facility], [address], [place], [place] [address]    Report Status 03/08/2020 FINAL  Final    Scheduled Meds: . [MAR Hold] sodium chloride   Intravenous Once  . [MAR Hold] amLODipine  10 mg Oral Daily  . [MAR Hold] heparin  7,500 Units Subcutaneous Q8H  . [MAR Hold] insulin aspart  0-5 Units Subcutaneous QHS  . [MAR Hold] insulin aspart  0-9 Units Subcutaneous TID WC  . [MAR Hold] multivitamin with minerals  1 tablet Oral Daily  . [MAR Hold] nutrition supplement (JUVEN)  1 packet Oral BID BM  . [MAR Hold] pantoprazole  40 mg Oral Daily  . [MAR Hold] Ensure Max Protein  11 oz Oral TID   Continuous Infusions: . [MAR Hold] piperacillin-tazobactam (ZOSYN)  IV 3.375 g (03/09/20 0439)  Assessment & Plan:   Principal Problem:   Severe sepsis with acute organ dysfunction (HCC) Active Problems:   Hyponatremia   Diabetic polyneuropathy associated with type 2 diabetes mellitus (HCC)   Acute kidney failure (HCC)   Diabetic ulcer of left foot (HCC)   Infection of left foot   Necrotizing fasciitis (HCC)   Subacute osteomyelitis, left ankle and foot (HCC)   Acute osteomyelitis of left foot (HCC)  LOS: 10 days   Time spent: 30min  Zannie CovePreetha Oaklee Sunga, MD [facility] 03/09/2020, 11:16 AM

## 2020-03-09 NOTE — Anesthesia Preprocedure Evaluation (Addendum)
Anesthesia Evaluation  Patient identified by MRN, date of birth, ID band Patient awake    Reviewed: Allergy & Precautions, NPO status , Patient's Chart, lab work & pertinent test results  History of Anesthesia Complications (+) history of anesthetic complications  Airway Mallampati: III  TM Distance: >3 FB Neck ROM: Full    Dental  (+) Poor Dentition, Missing, Dental Advisory Given   Pulmonary asthma , sleep apnea , pneumonia, Patient abstained from smoking., former smoker,    Pulmonary exam normal        Cardiovascular negative cardio ROS Normal cardiovascular exam     Neuro/Psych Seizures -, Well Controlled,  PSYCHIATRIC DISORDERS Depression  Neuromuscular disease    GI/Hepatic negative GI ROS, Neg liver ROS,   Endo/Other  diabetes  Renal/GU Renal InsufficiencyRenal disease     Musculoskeletal   Abdominal (+) - obese,   Peds  Hematology   Anesthesia Other Findings   Reproductive/Obstetrics                            Echo:  1. Left ventricular ejection fraction, by estimation, is 55 to 60%. The  left ventricle has normal function. The left ventricle has no regional  wall motion abnormalities. The left ventricular internal cavity size was  mildly dilated. There is moderate  left ventricular hypertrophy. Left ventricular diastolic parameters were  normal.  2. Right ventricular systolic function is normal. The right ventricular  size is mildly enlarged. Tricuspid regurgitation signal is inadequate for  assessing PA pressure.  3. The mitral valve is normal in structure. Trivial mitral valve  regurgitation.  4. The aortic valve is tricuspid. Aortic valve regurgitation is not  visualized. No aortic stenosis is present.  5. Aortic dilatation noted. There is mild dilatation of the ascending  aorta, measuring 37 mm.  6. The inferior vena cava is normal in size with greater than 50%   respiratory variability, suggesting right atrial pressure of 3 mmHg.   Anesthesia Physical  Anesthesia Plan  ASA: III  Anesthesia Plan: General   Post-op Pain Management:  Regional for Post-op pain   Induction: Intravenous  PONV Risk Score and Plan: 3 and Ondansetron, Dexamethasone and Midazolam  Airway Management Planned: LMA  Additional Equipment: None  Intra-op Plan:   Post-operative Plan: Extubation in OR  Informed Consent: I have reviewed the patients History and Physical, chart, labs and discussed the procedure including the risks, benefits and alternatives for the proposed anesthesia with the patient or authorized representative who has indicated his/her understanding and acceptance.     Dental advisory given  Plan Discussed with: CRNA, Anesthesiologist and Surgeon  Anesthesia Plan Comments:        Anesthesia Quick Evaluation

## 2020-03-09 NOTE — Anesthesia Postprocedure Evaluation (Signed)
Anesthesia Post Note  Patient: Melquan Ernsberger  Procedure(s) Performed: AMPUTATION BELOW KNEE (Left Knee)     Patient location during evaluation: PACU Anesthesia Type: General Level of consciousness: sedated Pain management: pain level controlled Vital Signs Assessment: post-procedure vital signs reviewed and stable Respiratory status: spontaneous breathing and respiratory function stable Cardiovascular status: stable Postop Assessment: no apparent nausea or vomiting Anesthetic complications: no   No complications documented.  Last Vitals:  Vitals:   03/09/20 1140 03/09/20 1155  BP: (!) 143/90 (!) 151/84  Pulse: 95 94  Resp: (!) 23 19  Temp:  (!) 36.2 C  SpO2: 94% 99%    Last Pain:  Vitals:   03/09/20 1155  TempSrc:   PainSc: 0-No pain                 Federick Levene DANIEL

## 2020-03-10 ENCOUNTER — Encounter (HOSPITAL_COMMUNITY): Payer: Self-pay | Admitting: Orthopedic Surgery

## 2020-03-10 LAB — GLUCOSE, CAPILLARY
Glucose-Capillary: 121 mg/dL — ABNORMAL HIGH (ref 70–99)
Glucose-Capillary: 124 mg/dL — ABNORMAL HIGH (ref 70–99)
Glucose-Capillary: 109 mg/dL — ABNORMAL HIGH (ref 70–99)
Glucose-Capillary: 110 mg/dL — ABNORMAL HIGH (ref 70–99)

## 2020-03-10 LAB — BASIC METABOLIC PANEL
Anion gap: 8 (ref 5–15)
BUN: 34 mg/dL — ABNORMAL HIGH (ref 6–20)
CO2: 22 mmol/L (ref 22–32)
Calcium: 8.5 mg/dL — ABNORMAL LOW (ref 8.9–10.3)
Chloride: 106 mmol/L (ref 98–111)
Creatinine, Ser: 1.55 mg/dL — ABNORMAL HIGH (ref 0.61–1.24)
GFR, Estimated: 59 mL/min — ABNORMAL LOW (ref >60)
Glucose, Bld: 145 mg/dL — ABNORMAL HIGH (ref 70–99)
Potassium: 5 mmol/L (ref 3.5–5.1)
Sodium: 136 mmol/L (ref 135–145)

## 2020-03-10 LAB — PREPARE RBC (CROSSMATCH)

## 2020-03-10 LAB — CBC
HCT: 23.2 % — ABNORMAL LOW (ref 39.0–52.0)
Hemoglobin: 7.1 g/dL — ABNORMAL LOW (ref 13.0–17.0)
MCH: 27.7 pg (ref 26.0–34.0)
MCHC: 30.6 g/dL (ref 30.0–36.0)
MCV: 90.6 fL (ref 80.0–100.0)
Platelets: 446 10*3/uL — ABNORMAL HIGH (ref 150–400)
RBC: 2.56 MIL/uL — ABNORMAL LOW (ref 4.22–5.81)
RDW: 15.9 % — ABNORMAL HIGH (ref 11.5–15.5)
WBC: 11.9 10*3/uL — ABNORMAL HIGH (ref 4.0–10.5)
nRBC: 0 % (ref 0.0–0.2)

## 2020-03-10 MED ORDER — SODIUM CHLORIDE 0.9% IV SOLUTION: Freq: Once | INTRAVENOUS | Status: DC

## 2020-03-10 MED ORDER — SENNOSIDES-DOCUSATE SODIUM 8.6-50 MG PO TABS
1.0000 | ORAL_TABLET | Freq: Two times a day (BID) | ORAL | Status: DC
  Administered 2020-03-10: 1 via ORAL
  Filled 2020-03-10 (×2): qty 1

## 2020-03-10 MED ORDER — GABAPENTIN 300 MG PO CAPS
300.0000 mg | ORAL_CAPSULE | Freq: Three times a day (TID) | ORAL | Status: DC
  Administered 2020-03-10 – 2020-03-11 (×4): 300 mg via ORAL
  Filled 2020-03-10 (×4): qty 1

## 2020-03-10 MED ORDER — HYDROMORPHONE HCL 1 MG/ML IJ SOLN
1.0000 mg | INTRAMUSCULAR | Status: DC | PRN
  Administered 2020-03-10: 1 mg via INTRAVENOUS
  Filled 2020-03-10: qty 1

## 2020-03-10 NOTE — Progress Notes (Signed)
PROGRESS NOTE    Ronnie Shaw  TMH:[id number] DOB: [birth year] DOA: 02/28/2020 PCP: Claiborne Rigg, NP    Brief Narrative:  39 year old with past medical history significant for obesity, type 2 diabetes, diabetic foot ulcer with right foot transmetatarsal amputation in 2020 presents to the ED with fevers, chills, nausea, vomiting and cough and worsening pain in his left foot. -Admitted with sepsis, necrotic diabetic foot ulcer and acute kidney injury. -Underwent  left foot fourth and fifth ray amputation, extensive excisional debridement of the skin and soft tissue muscle and fascia application of wound VAC on 11/17. Repeat debridement of left Foot on 11/19. 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 Group C strep bacteremia -Blood cultures grew group C strep, OR cultures were polymicrobial with pansensitive Pseudomonas and repeat culture with actinomyces, strep anginosis and parabacteroides -Initially was on daptomycin and Zosyn, now on Zosyn alone -ABIs were normal -Underwent left foot fourth and fifth ray amputation with extensive excisional debridement of skin soft tissue and fascia on 11/17 followed by repeat debridement on 11/19 -Dr. Lajoyce Corners following closely he was concerned that the patient has less viable soft tissue on the left foot with no interval healing after previous debridements and hence recommended transtibial amputation, this was completed 11/24 -Per ID continue antibiotics until 11/28 -PT OT following, CIR consulted  AKI: Creatinine baseline 1.6 from July, creatinine on admission was 3.5 in the setting of ATN from sepsis -Creatinine has improved down to 1.5, discontinued IV fluids  Type 2 diabetes mellitus Obesity BMI 44, weight loss was encouraged  A1c 6.4 -CBGs are stable  Hyponatremia;  Resolved.   Acute blood loss anemia Iron  deficiency anemia -Hemoglobin has ranged from the 8-9 range -Transfused one unit PRBC 11/17, anemia panel; iron deficiency anemia.  -Hemoglobin down to 7.1 we will transfuse another unit of PRBC, will order IV iron tomorrow  HTN- -started on Norvasc   DVT prophylaxis: Heparin subcutaneous Code Status: Full Family Communication: Care discussed with patient  Status is: Inpatient  Remains inpatient appropriate because:IV treatments appropriate due to intensity of illness   Dispo: The patient is from: Home  Anticipated d/c is to: CIR/TBD  Anticipated d/c date is: Likely 48 hours  Patient currently is not medically stable to d/c  Consultants:   ID, orthopedics  Procedures:  -left foot fourth and fifth ray amputation with extensive excisional debridement of skin soft tissue and fascia on 11/17  -repeat debridement on 11/19 -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  Antimicrobials:   Daptomycin, Zosyn   Subjective: -Complains of pain in his left leg  Objective: Vitals:   03/10/20 0756 03/10/20 0825 03/10/20 0922 03/10/20 1100  BP: (!) 154/91 140/78 (!) 159/88 126/79  Pulse: 93 87 93 88  Resp: 20 20 18 20   Temp: 98 F (36.7 C) 98.6 F (37 C) 98.8 F (37.1 C) 98.4 F (36.9 C)  TempSrc: Oral Oral Oral Oral  SpO2: 97% 99% 98% 97%  Weight:      Height:        Intake/Output Summary (Last 24 hours) at 03/10/2020 1141 Last data filed at 03/10/2020 1100 Gross per 24 hour  Intake 1969.72 ml  Output 1075 ml  Net 894.72 ml   Filed Weights   03/04/20 2137 03/06/20 2110 03/09/20 0931  Weight: (!) 168.2 kg (!) 168.2 kg (!) 168.2 kg    Examination: Gen: Awake alert oriented x3, no distress, flat  affect CVS: S1-S2, regular rate rhythm Lungs: Few basilar rales Abdomen: Soft, nontender, nondistended, bowel sounds present Extremities: Left BKA with dressing, wound VAC, right foot transmetatarsal amputation with dressing Skin: No  rashes on exposed skin  Skin: no new rashes on exposed skin  Data Reviewed: I have personally reviewed following labs and imaging studies  CBC: Recent Labs  Lab 03/04/20 1036 03/04/20 1036 03/05/20 1205 03/06/20 0436 03/08/20 0508 03/09/20 0945 03/10/20 0132  WBC 17.7*  --  17.0* 17.6* 13.5*  --  11.9*  HGB 8.1*   < > 7.9* 7.6* 7.3* 8.8* 7.1*  HCT 25.8*   < > 25.3* 24.5* 23.5* 26.0* 23.2*  MCV 86.9  --  88.8 87.5 88.0  --  90.6  PLT 416*  --  448* 427* 426*  --  446*   < > = values in this interval not displayed.   Basic Metabolic Panel: Recent Labs  Lab 03/04/20 1036 03/04/20 1036 03/05/20 1205 03/05/20 1205 03/06/20 0436 03/08/20 0508 03/09/20 0443 03/09/20 0945 03/10/20 0132  NA 135   < > 133*  --  132* 135  --  138 136  K 4.5   < > 4.7  --  4.6 4.9  --  5.4* 5.0  CL 104   < > 103  --  102 104  --  110 106  CO2 19*  --  20*  --  22 19*  --   --  22  GLUCOSE 160*   < > 135*  --  131* 147*  --  113* 145*  BUN 60*   < > 48*  --  43* 37*  --  34* 34*  CREATININE 1.83*   < > 1.72*   < > 1.68* 1.53* 1.50* 1.50* 1.55*  CALCIUM 8.6*  --  8.3*  --  8.2* 8.6*  --   --  8.5*   < > = values in this interval not displayed.   GFR: Estimated Creatinine Clearance: 106.3 mL/min (A) (by C-G formula based on SCr of 1.55 mg/dL (H)). Liver Function Tests: No results for input(s): AST, ALT, ALKPHOS, BILITOT, PROT, ALBUMIN in the last 168 hours. No results for input(s): LIPASE, AMYLASE in the last 168 hours. No results for input(s): AMMONIA in the last 168 hours. Coagulation Profile: No results for input(s): INR, PROTIME in the last 168 hours. Cardiac Enzymes: Recent Labs  Lab 03/04/20 0246 03/07/20 0602  CKTOTAL 273 138   BNP (last 3 results) No results for input(s): PROBNP in the last 8760 hours. HbA1C: No results for input(s): HGBA1C in the last 72 hours. CBG: Recent Labs  Lab 03/09/20 1222 03/09/20 1636 03/09/20 2015 03/10/20 0631 03/10/20 1126  GLUCAP 150* 113*  177* 121* 124*   Lipid Profile: No results for input(s): CHOL, HDL, LDLCALC, TRIG, CHOLHDL, LDLDIRECT in the last 72 hours. Thyroid Function Tests: No results for input(s): TSH, T4TOTAL, FREET4, T3FREE, THYROIDAB in the last 72 hours. Anemia Panel: No results for input(s): VITAMINB12, FOLATE, FERRITIN, TIBC, IRON, RETICCTPCT in the last 72 hours. Sepsis Labs: No results for input(s): PROCALCITON, LATICACIDVEN in the last 168 hours.  Recent Results (from the past 240 hour(s))  Surgical pcr screen     Status: None   Collection Time: 03/02/20  8:59 AM   Specimen: Nasal Mucosa; Nasal Swab  Result Value Ref Range Status   MRSA, PCR NEGATIVE NEGATIVE Final   Staphylococcus aureus NEGATIVE NEGATIVE Final    Comment: (NOTE) The Xpert SA Assay (FDA approved for  NASAL specimens in patients 42 years of age and older), is one component of a comprehensive surveillance program. It is not intended to diagnose infection nor to guide or monitor treatment. Performed at [facility], [address], [place], [place] [address]   Aerobic/Anaerobic Culture (surgical/deep wound)     Status: None   Collection Time: 03/02/20  3:37 PM   Specimen: Soft Tissue, Other  Result Value Ref Range Status   Specimen Description TISSUE LEFT FOOT  Final   Special Requests PT ON ROCEPHIN  Final   Gram Stain   Final    NO WBC SEEN ABUNDANT GRAM POSITIVE COCCI RARE GRAM NEGATIVE RODS Performed at [facility], [address] [place], [place] [address]    Culture   Final    FEW PSEUDOMONAS AERUGINOSA MIXED ANAEROBIC FLORA PRESENT.  CALL LAB IF FURTHER IID REQUIRED.    Report Status 03/06/2020 FINAL  Final   Organism ID, Bacteria PSEUDOMONAS AERUGINOSA  Final      Susceptibility   Pseudomonas aeruginosa - MIC*    CEFTAZIDIME 4 SENSITIVE Sensitive     CIPROFLOXACIN <=0.25 SENSITIVE Sensitive     GENTAMICIN <=1 SENSITIVE Sensitive     IMIPENEM 2 SENSITIVE Sensitive     PIP/TAZO <=4 SENSITIVE  Sensitive     CEFEPIME 2 SENSITIVE Sensitive     * FEW PSEUDOMONAS AERUGINOSA  Aerobic/Anaerobic Culture (surgical/deep wound)     Status: None   Collection Time: 03/02/20  3:41 PM   Specimen: Soft Tissue, Other  Result Value Ref Range Status   Specimen Description TISSUE LEFT FOOT  Final   Special Requests SAMPLE B DEEP TISSUE PT ON ROCEPHIN  Final   Gram Stain NO WBC SEEN RARE GRAM POSITIVE COCCI   Final   Culture   Final    RARE ACTINOMYCES ODONTOLYTICUS RARE STREPTOCOCCUS ANGINOSIS FEW PARABACTEROIDES DISTASONIS BETA LACTAMASE POSITIVE Performed at [facility], [address] [place], [place] [address]    Report Status 03/05/2020 FINAL  Final  Culture, blood (routine x 2)     Status: None   Collection Time: 03/03/20  1:02 PM   Specimen: BLOOD  Result Value Ref Range Status   Specimen Description BLOOD RIGHT ANTECUBITAL  Final   Special Requests   Final    BOTTLES DRAWN AEROBIC AND ANAEROBIC Blood Culture adequate volume   Culture   Final    NO GROWTH 5 DAYS Performed at [facility], [address], [place], [place] [address]    Report Status 03/08/2020 FINAL  Final  Culture, blood (routine x 2)     Status: None   Collection Time: 03/03/20  1:14 PM   Specimen: BLOOD  Result Value Ref Range Status   Specimen Description BLOOD LEFT ANTECUBITAL  Final   Special Requests   Final    BOTTLES DRAWN AEROBIC AND ANAEROBIC Blood Culture adequate volume   Culture   Final    NO GROWTH 5 DAYS Performed at [facility], [address] [place], [place] [address]    Report Status 03/08/2020 FINAL  Final    Scheduled Meds: . sodium chloride   Intravenous Once  . amLODipine  10 mg Oral Daily  . docusate sodium  100 mg Oral BID  . gabapentin  300 mg Oral TID  . heparin  7,500 Units Subcutaneous Q8H  . insulin aspart  0-5 Units Subcutaneous QHS  . insulin aspart  0-9 Units Subcutaneous TID WC  .  multivitamin with minerals  1 tablet Oral Daily  . nutrition  supplement (JUVEN)  1 packet Oral BID BM  . pantoprazole  40 mg Oral Daily  . Ensure Max Protein  11 oz Oral TID   Continuous Infusions: . methocarbamol (ROBAXIN) IV    . piperacillin-tazobactam (ZOSYN)  IV 3.375 g (03/10/20 0601)    Assessment & Plan:   Principal Problem:   Severe sepsis with acute organ dysfunction (HCC) Active Problems:   Hyponatremia   Diabetic polyneuropathy associated with type 2 diabetes mellitus (HCC)   Acute kidney failure (HCC)   Diabetic ulcer of left foot (HCC)   Infection of left foot   Necrotizing fasciitis (HCC)   Subacute osteomyelitis, left ankle and foot (HCC)   Acute osteomyelitis of left foot (HCC)  LOS: 11 days   Time spent:  Zannie Cove, MD [facility] 03/10/2020, 11:41 AM
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
## 2020-03-10 NOTE — Progress Notes (Signed)
Patient ID: Ronnie Shaw, male   DOB: 10-09-1980, 39 y.o.   MRN: [id number] Postoperative day 1 left transtibial amputation.  Patient has had excellent improvement in his white blood cell count since surgery.  White blood cell count 11.9 hemoglobin dropped back to 7.1 currently receiving a unit of packed red blood cells.  There is 125 cc in the wound VAC canister.  Patient complains of increased pain since the regional block has worn off.  Orders written for Neurontin to help with pain control.  With patient's recent renal injury will not order Toradol.
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
## 2020-03-11 ENCOUNTER — Other Ambulatory Visit: Payer: Self-pay

## 2020-03-11 ENCOUNTER — Encounter (HOSPITAL_COMMUNITY): Payer: Self-pay | Admitting: Internal Medicine

## 2020-03-11 ENCOUNTER — Inpatient Hospital Stay (HOSPITAL_COMMUNITY)
Admission: RE | Admit: 2020-03-11 | Discharge: 2020-03-17 | DRG: 560 | Disposition: A | Discharge status: Home / Self Care | Payer: Self-pay | Source: Intra-hospital | Attending: Physical Medicine & Rehabilitation | Admitting: Physical Medicine & Rehabilitation

## 2020-03-11 ENCOUNTER — Encounter (HOSPITAL_COMMUNITY): Payer: Self-pay | Admitting: Physical Medicine & Rehabilitation

## 2020-03-11 DIAGNOSIS — D62 Acute posthemorrhagic anemia: Secondary | ICD-10-CM | POA: Diagnosis present

## 2020-03-11 DIAGNOSIS — Z4781 Encounter for orthopedic aftercare following surgical amputation: Principal | ICD-10-CM

## 2020-03-11 DIAGNOSIS — Z794 Long term (current) use of insulin: Secondary | ICD-10-CM

## 2020-03-11 DIAGNOSIS — Z89512 Acquired absence of left leg below knee: Secondary | ICD-10-CM

## 2020-03-11 DIAGNOSIS — Z9111 Patient's noncompliance with dietary regimen: Secondary | ICD-10-CM

## 2020-03-11 DIAGNOSIS — F32A Depression, unspecified: Secondary | ICD-10-CM | POA: Diagnosis present

## 2020-03-11 DIAGNOSIS — S98911A Complete traumatic amputation of right foot, level unspecified, initial encounter: Secondary | ICD-10-CM

## 2020-03-11 DIAGNOSIS — Z79899 Other long term (current) drug therapy: Secondary | ICD-10-CM

## 2020-03-11 DIAGNOSIS — E875 Hyperkalemia: Secondary | ICD-10-CM | POA: Diagnosis present

## 2020-03-11 DIAGNOSIS — M86272 Subacute osteomyelitis, left ankle and foot: Secondary | ICD-10-CM | POA: Diagnosis present

## 2020-03-11 DIAGNOSIS — I129 Hypertensive chronic kidney disease with stage 1 through stage 4 chronic kidney disease, or unspecified chronic kidney disease: Secondary | ICD-10-CM | POA: Diagnosis present

## 2020-03-11 DIAGNOSIS — G4700 Insomnia, unspecified: Secondary | ICD-10-CM | POA: Diagnosis present

## 2020-03-11 DIAGNOSIS — Z833 Family history of diabetes mellitus: Secondary | ICD-10-CM

## 2020-03-11 DIAGNOSIS — S88112D Complete traumatic amputation at level between knee and ankle, left lower leg, subsequent encounter: Secondary | ICD-10-CM

## 2020-03-11 DIAGNOSIS — E440 Moderate protein-calorie malnutrition: Secondary | ICD-10-CM | POA: Diagnosis present

## 2020-03-11 DIAGNOSIS — E1165 Type 2 diabetes mellitus with hyperglycemia: Secondary | ICD-10-CM | POA: Diagnosis present

## 2020-03-11 DIAGNOSIS — N183 Chronic kidney disease, stage 3 unspecified: Secondary | ICD-10-CM

## 2020-03-11 DIAGNOSIS — G479 Sleep disorder, unspecified: Secondary | ICD-10-CM

## 2020-03-11 DIAGNOSIS — T8130XA Disruption of wound, unspecified, initial encounter: Secondary | ICD-10-CM | POA: Diagnosis present

## 2020-03-11 DIAGNOSIS — Z9119 Patient's noncompliance with other medical treatment and regimen: Secondary | ICD-10-CM

## 2020-03-11 DIAGNOSIS — E1122 Type 2 diabetes mellitus with diabetic chronic kidney disease: Secondary | ICD-10-CM | POA: Diagnosis present

## 2020-03-11 DIAGNOSIS — Z87891 Personal history of nicotine dependence: Secondary | ICD-10-CM

## 2020-03-11 DIAGNOSIS — G4730 Sleep apnea, unspecified: Secondary | ICD-10-CM | POA: Diagnosis present

## 2020-03-11 DIAGNOSIS — N1831 Chronic kidney disease, stage 3a: Secondary | ICD-10-CM | POA: Diagnosis present

## 2020-03-11 DIAGNOSIS — B954 Other streptococcus as the cause of diseases classified elsewhere: Secondary | ICD-10-CM | POA: Diagnosis present

## 2020-03-11 DIAGNOSIS — R7881 Bacteremia: Secondary | ICD-10-CM | POA: Diagnosis present

## 2020-03-11 DIAGNOSIS — N179 Acute kidney failure, unspecified: Secondary | ICD-10-CM | POA: Diagnosis present

## 2020-03-11 DIAGNOSIS — D869 Sarcoidosis, unspecified: Secondary | ICD-10-CM | POA: Diagnosis present

## 2020-03-11 DIAGNOSIS — G8918 Other acute postprocedural pain: Secondary | ICD-10-CM

## 2020-03-11 DIAGNOSIS — Z713 Dietary counseling and surveillance: Secondary | ICD-10-CM

## 2020-03-11 DIAGNOSIS — D509 Iron deficiency anemia, unspecified: Secondary | ICD-10-CM | POA: Diagnosis present

## 2020-03-11 DIAGNOSIS — Z888 Allergy status to other drugs, medicaments and biological substances status: Secondary | ICD-10-CM

## 2020-03-11 DIAGNOSIS — E1142 Type 2 diabetes mellitus with diabetic polyneuropathy: Secondary | ICD-10-CM | POA: Diagnosis present

## 2020-03-11 DIAGNOSIS — D649 Anemia, unspecified: Secondary | ICD-10-CM

## 2020-03-11 DIAGNOSIS — S88112A Complete traumatic amputation at level between knee and ankle, left lower leg, initial encounter: Secondary | ICD-10-CM | POA: Diagnosis present

## 2020-03-11 DIAGNOSIS — I10 Essential (primary) hypertension: Secondary | ICD-10-CM

## 2020-03-11 DIAGNOSIS — Z91199 Patient's noncompliance with other medical treatment and regimen due to unspecified reason: Secondary | ICD-10-CM

## 2020-03-11 DIAGNOSIS — R7309 Other abnormal glucose: Secondary | ICD-10-CM

## 2020-03-11 DIAGNOSIS — Z6841 Body Mass Index (BMI) 40.0 and over, adult: Secondary | ICD-10-CM

## 2020-03-11 LAB — GLUCOSE, CAPILLARY
Glucose-Capillary: 109 mg/dL — ABNORMAL HIGH (ref 70–99)
Glucose-Capillary: 106 mg/dL — ABNORMAL HIGH (ref 70–99)
Glucose-Capillary: 168 mg/dL — ABNORMAL HIGH (ref 70–99)

## 2020-03-11 LAB — CBC
HCT: 24.9 % — ABNORMAL LOW (ref 39.0–52.0)
Hemoglobin: 7.6 g/dL — ABNORMAL LOW (ref 13.0–17.0)
MCH: 27.7 pg (ref 26.0–34.0)
MCHC: 30.5 g/dL (ref 30.0–36.0)
MCV: 90.9 fL (ref 80.0–100.0)
Platelets: 430 10*3/uL — ABNORMAL HIGH (ref 150–400)
RBC: 2.74 MIL/uL — ABNORMAL LOW (ref 4.22–5.81)
RDW: 15.9 % — ABNORMAL HIGH (ref 11.5–15.5)
WBC: 11 10*3/uL — ABNORMAL HIGH (ref 4.0–10.5)
nRBC: 0 % (ref 0.0–0.2)

## 2020-03-11 LAB — TYPE AND SCREEN
ABO/RH(D): B POS
Antibody Screen: NEGATIVE
Unit division: 0
Unit division: 0

## 2020-03-11 LAB — BASIC METABOLIC PANEL
Anion gap: 10 (ref 5–15)
BUN: 32 mg/dL — ABNORMAL HIGH (ref 6–20)
CO2: 22 mmol/L (ref 22–32)
Calcium: 8.6 mg/dL — ABNORMAL LOW (ref 8.9–10.3)
Chloride: 103 mmol/L (ref 98–111)
Creatinine, Ser: 1.67 mg/dL — ABNORMAL HIGH (ref 0.61–1.24)
GFR, Estimated: 54 mL/min — ABNORMAL LOW (ref >60)
Glucose, Bld: 134 mg/dL — ABNORMAL HIGH (ref 70–99)
Potassium: 5.2 mmol/L — ABNORMAL HIGH (ref 3.5–5.1)
Sodium: 135 mmol/L (ref 135–145)

## 2020-03-11 LAB — BPAM RBC
Blood Product Expiration Date: 202112072359
Blood Product Expiration Date: 202112162359
ISSUE DATE / TIME: 202111231641
ISSUE DATE / TIME: 202111250806
Unit Type and Rh: 7300
Unit Type and Rh: 7300

## 2020-03-11 MED ORDER — FLEET ENEMA 7-19 GM/118ML RE ENEM: 1.0000 | ENEMA | Freq: Once | RECTAL | Status: DC | PRN

## 2020-03-11 MED ORDER — PROCHLORPERAZINE EDISYLATE 10 MG/2ML IJ SOLN: 5.0000 mg | Freq: Four times a day (QID) | INTRAMUSCULAR | Status: DC | PRN

## 2020-03-11 MED ORDER — PIPERACILLIN-TAZOBACTAM 3.375 G IVPB
3.3750 g | Freq: Three times a day (TID) | INTRAVENOUS | Status: AC
  Administered 2020-03-11 – 2020-03-14 (×8): 3.375 g via INTRAVENOUS
  Filled 2020-03-11 (×8): qty 50

## 2020-03-11 MED ORDER — SODIUM CHLORIDE 0.9 % IV SOLN: 510.0000 mg | INTRAVENOUS | Status: DC

## 2020-03-11 MED ORDER — TRAZODONE HCL 50 MG PO TABS: 25.0000 mg | ORAL_TABLET | Freq: Every evening | ORAL | Status: DC | PRN

## 2020-03-11 MED ORDER — BISACODYL 10 MG RE SUPP: 10.0000 mg | Freq: Every day | RECTAL | Status: DC | PRN

## 2020-03-11 MED ORDER — PROCHLORPERAZINE MALEATE 5 MG PO TABS: 5.0000 mg | ORAL_TABLET | Freq: Four times a day (QID) | ORAL | Status: DC | PRN

## 2020-03-11 MED ORDER — NEPRO/CARBSTEADY PO LIQD
237.0000 mL | Freq: Three times a day (TID) | ORAL | Status: DC
  Administered 2020-03-11 – 2020-03-13 (×5): 237 mL via ORAL

## 2020-03-11 MED ORDER — POLYETHYLENE GLYCOL 3350 17 G PO PACK: 17.0000 g | PACK | Freq: Every day | ORAL | Status: DC | PRN

## 2020-03-11 MED ORDER — CYCLOBENZAPRINE HCL 5 MG PO TABS
5.0000 mg | ORAL_TABLET | Freq: Three times a day (TID) | ORAL | Status: DC | PRN
  Administered 2020-03-16: 5 mg via ORAL
  Filled 2020-03-11 (×2): qty 1

## 2020-03-11 MED ORDER — ACETAMINOPHEN 325 MG PO TABS
325.0000 mg | ORAL_TABLET | ORAL | Status: DC | PRN
  Administered 2020-03-15: 650 mg via ORAL
  Filled 2020-03-11: qty 2

## 2020-03-11 MED ORDER — GABAPENTIN 300 MG PO CAPS
300.0000 mg | ORAL_CAPSULE | Freq: Three times a day (TID) | ORAL | Status: DC
  Administered 2020-03-11 – 2020-03-15 (×11): 300 mg via ORAL
  Filled 2020-03-11 (×11): qty 1

## 2020-03-11 MED ORDER — JUVEN PO PACK
1.0000 | PACK | Freq: Two times a day (BID) | ORAL | Status: DC
  Administered 2020-03-12 – 2020-03-15 (×5): 1 via ORAL
  Filled 2020-03-11 (×6): qty 1

## 2020-03-11 MED ORDER — FUROSEMIDE 10 MG/ML IJ SOLN
40.0000 mg | Freq: Once | INTRAMUSCULAR | Status: AC
  Administered 2020-03-11: 40 mg via INTRAVENOUS
  Filled 2020-03-11: qty 4

## 2020-03-11 MED ORDER — ENOXAPARIN SODIUM 80 MG/0.8ML ~~LOC~~ SOLN
80.0000 mg | SUBCUTANEOUS | Status: DC
  Administered 2020-03-11 – 2020-03-16 (×6): 80 mg via SUBCUTANEOUS
  Filled 2020-03-11 (×6): qty 0.8

## 2020-03-11 MED ORDER — GUAIFENESIN-DM 100-10 MG/5ML PO SYRP: 5.0000 mL | ORAL_SOLUTION | Freq: Four times a day (QID) | ORAL | Status: DC | PRN

## 2020-03-11 MED ORDER — PANTOPRAZOLE SODIUM 40 MG PO TBEC
40.0000 mg | DELAYED_RELEASE_TABLET | Freq: Every day | ORAL | Status: DC
  Administered 2020-03-12 – 2020-03-17 (×6): 40 mg via ORAL
  Filled 2020-03-11 (×6): qty 1

## 2020-03-11 MED ORDER — NITROGLYCERIN 0.2 MG/HR TD PT24
0.2000 mg | MEDICATED_PATCH | Freq: Every day | TRANSDERMAL | Status: DC
  Administered 2020-03-11 – 2020-03-17 (×7): 0.2 mg via TRANSDERMAL
  Filled 2020-03-11 (×8): qty 1

## 2020-03-11 MED ORDER — ENSURE MAX PROTEIN PO LIQD: 11.0000 [oz_av] | Freq: Three times a day (TID) | ORAL | Status: DC

## 2020-03-11 MED ORDER — TRAMADOL HCL 50 MG PO TABS
50.0000 mg | ORAL_TABLET | Freq: Four times a day (QID) | ORAL | Status: DC | PRN
  Administered 2020-03-17: 50 mg via ORAL
  Filled 2020-03-11: qty 1

## 2020-03-11 MED ORDER — OXYCODONE HCL 5 MG PO TABS
10.0000 mg | ORAL_TABLET | ORAL | Status: DC | PRN
  Administered 2020-03-11: 10 mg via ORAL
  Administered 2020-03-12 – 2020-03-14 (×4): 15 mg via ORAL
  Filled 2020-03-11: qty 2
  Filled 2020-03-11 (×2): qty 3
  Filled 2020-03-11: qty 2
  Filled 2020-03-11 (×2): qty 3

## 2020-03-11 MED ORDER — PROCHLORPERAZINE 25 MG RE SUPP: 12.5000 mg | Freq: Four times a day (QID) | RECTAL | Status: DC | PRN

## 2020-03-11 MED ORDER — SODIUM CHLORIDE 0.9 % IV SOLN
510.0000 mg | INTRAVENOUS | Status: DC
  Administered 2020-03-11: 510 mg via INTRAVENOUS
  Filled 2020-03-11: qty 17

## 2020-03-11 MED ORDER — ALUM & MAG HYDROXIDE-SIMETH 200-200-20 MG/5ML PO SUSP: 30.0000 mL | ORAL | Status: DC | PRN

## 2020-03-11 MED ORDER — DIPHENHYDRAMINE HCL 12.5 MG/5ML PO ELIX: 12.5000 mg | ORAL_SOLUTION | Freq: Four times a day (QID) | ORAL | Status: DC | PRN

## 2020-03-11 MED ORDER — SODIUM CHLORIDE 0.9 % IV SOLN
510.0000 mg | INTRAVENOUS | Status: DC
Start: 2020-03-18 — End: 2020-03-11

## 2020-03-11 MED ORDER — AMLODIPINE BESYLATE 10 MG PO TABS
10.0000 mg | ORAL_TABLET | Freq: Every day | ORAL | Status: DC
  Administered 2020-03-12 – 2020-03-17 (×6): 10 mg via ORAL
  Filled 2020-03-11 (×6): qty 1

## 2020-03-11 MED ORDER — INSULIN ASPART 100 UNIT/ML ~~LOC~~ SOLN
0.0000 [IU] | Freq: Three times a day (TID) | SUBCUTANEOUS | Status: DC
  Administered 2020-03-12 (×2): 1 [IU] via SUBCUTANEOUS
  Administered 2020-03-13: 2 [IU] via SUBCUTANEOUS
  Administered 2020-03-14 – 2020-03-16 (×5): 1 [IU] via SUBCUTANEOUS
  Administered 2020-03-16 – 2020-03-17 (×2): 2 [IU] via SUBCUTANEOUS

## 2020-03-11 MED ORDER — INSULIN ASPART 100 UNIT/ML ~~LOC~~ SOLN: 0.0000 [IU] | Freq: Every day | SUBCUTANEOUS | Status: DC

## 2020-03-11 MED ORDER — PENTOXIFYLLINE ER 400 MG PO TBCR
400.0000 mg | EXTENDED_RELEASE_TABLET | Freq: Three times a day (TID) | ORAL | Status: DC
  Administered 2020-03-11 – 2020-03-17 (×17): 400 mg via ORAL
  Filled 2020-03-11 (×17): qty 1

## 2020-03-11 MED ORDER — ADULT MULTIVITAMIN W/MINERALS CH
1.0000 | ORAL_TABLET | Freq: Every day | ORAL | Status: DC
  Administered 2020-03-12 – 2020-03-17 (×6): 1 via ORAL
  Filled 2020-03-11 (×6): qty 1

## 2020-03-11 MED ORDER — SENNOSIDES-DOCUSATE SODIUM 8.6-50 MG PO TABS
1.0000 | ORAL_TABLET | Freq: Two times a day (BID) | ORAL | Status: DC
  Administered 2020-03-13: 1 via ORAL
  Filled 2020-03-11 (×11): qty 1
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
## 2020-03-11 NOTE — Progress Notes (Signed)
PROGRESS NOTE    Ronnie Shaw  NTZ:[id number] DOB: 13-Apr-1981 DOA: 02/28/2020 PCP: Claiborne Rigg, NP    Brief Narrative:  39 year old with past medical history significant for obesity, type 2 diabetes, diabetic foot ulcer with right foot transmetatarsal amputation in 2020 presents to the ED with fevers, chills, nausea, vomiting and cough and worsening pain in his left foot. -Admitted with sepsis, necrotic diabetic foot ulcer and acute kidney injury. -Underwent  left foot fourth and fifth ray amputation, extensive excisional debridement of the skin and soft tissue muscle and fascia application of wound VAC on 11/17. Repeat debridement of left Foot on 11/19. 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 Group C strep bacteremia -Blood cultures grew group C strep, OR cultures were polymicrobial with pansensitive Pseudomonas and repeat culture with actinomyces, strep anginosis and parabacteroides -Initially was on daptomycin and Zosyn, now on Zosyn alone -ABIs were normal -Underwent left foot fourth and fifth ray amputation with extensive excisional debridement of skin soft tissue and fascia on 11/17 followed by repeat debridement on 11/19 -Dr. Lajoyce Corners following closely he was concerned that the patient has less viable soft tissue on the left foot with no interval healing after previous debridements and hence recommended transtibial amputation, this was completed 11/24 -Per ID continue antibiotics until 11/28 -Continue physical therapy, awaiting CIR for rehabilitation -Labs in a.m.  AKI: Creatinine baseline 1.6 from July, creatinine on admission was 3.5 in the setting of ATN from sepsis -Creatinine back to baseline now, monitor  Type 2 diabetes mellitus Obesity BMI 44, weight loss was encouraged  A1c 6.4 -CBGs are stable  Hyponatremia;  Resolved.   Acute  blood loss anemia Iron deficiency anemia -Hemoglobin has ranged from the 8-9 range -Transfused one unit PRBC 11/17, anemia panel; iron deficiency anemia.  -Hemoglobin down to 7.1 yesterday, transfused 1 unit of PRBC, hemoglobin 7.6 today, will give IV iron x1   HTN- -started on Norvasc   DVT prophylaxis: Heparin subcutaneous Code Status: Full Family Communication: Care discussed with patient  Status is: Inpatient  Remains inpatient appropriate because:IV treatments appropriate due to intensity of illness   Dispo: The patient is from: Home  Anticipated d/c is to: CIR/TBD  Anticipated d/c date is: Likely 48 hours  Patient currently is not medically stable to d/c  Consultants:   ID, orthopedics  Procedures:  -left foot fourth and fifth ray amputation with extensive excisional debridement of skin soft tissue and fascia on 11/17  -repeat debridement on 11/19 -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  Antimicrobials:   Daptomycin, Zosyn   Subjective: -Continues to have pain at his BKA site but overall improving, no other events overnight  Objective: Vitals:   03/10/20 1629 03/10/20 2100 03/11/20 0458 03/11/20 1027  BP: (!) 146/91 (!) 154/100 (!) 141/90 (!) 150/74  Pulse: 89 (!) 103 100 98  Resp: 18 18 18 19   Temp: 98.2 F (36.8 C) 99.1 F (37.3 C) 98.5 F (36.9 C) 99.4 F (37.4 C)  TempSrc: Oral     SpO2: 100% 100% 100% 100%  Weight:  (!) 168.2 kg    Height:        Intake/Output Summary (Last 24 hours) at 03/11/2020 1055 Last data filed at 03/11/2020 0458 Gross per 24 hour  Intake 1321 ml  Output 1700 ml  Net -379 ml   Filed Weights   03/06/20 2110 03/09/20 0931 03/10/20 2100  Weight: (!) 168.2 kg 03/12/20)  168.2 kg (!) 168.2 kg    Examination: Gen: Sleepy this morning, easily arousable, oriented x3, flat affect CVS: S1-S2, regular rate rhythm Lungs: Rare basilar rales Abdomen: Soft, nontender, nondistended, bowel  sounds present  extremities: Left BKA with dressing Skin: No rashes on exposed skin   Data Reviewed: I have personally reviewed following labs and imaging studies  CBC: Recent Labs  Lab 03/05/20 1205 03/05/20 1205 03/06/20 0436 03/08/20 0508 03/09/20 0945 03/10/20 0132 03/11/20 0127  WBC 17.0*  --  17.6* 13.5*  --  11.9* 11.0*  HGB 7.9*   < > 7.6* 7.3* 8.8* 7.1* 7.6*  HCT 25.3*   < > 24.5* 23.5* 26.0* 23.2* 24.9*  MCV 88.8  --  87.5 88.0  --  90.6 90.9  PLT 448*  --  427* 426*  --  446* 430*   < > = values in this interval not displayed.   Basic Metabolic Panel: Recent Labs  Lab 03/05/20 1205 03/05/20 1205 03/06/20 0436 03/06/20 0436 03/08/20 0508 03/09/20 0443 03/09/20 0945 03/10/20 0132 03/11/20 0127  NA 133*   < > 132*  --  135  --  138 136 135  K 4.7   < > 4.6  --  4.9  --  5.4* 5.0 5.2*  CL 103   < > 102  --  104  --  110 106 103  CO2 20*  --  22  --  19*  --   --  22 22  GLUCOSE 135*   < > 131*  --  147*  --  113* 145* 134*  BUN 48*   < > 43*  --  37*  --  34* 34* 32*  CREATININE 1.72*   < > 1.68*   < > 1.53* 1.50* 1.50* 1.55* 1.67*  CALCIUM 8.3*  --  8.2*  --  8.6*  --   --  8.5* 8.6*   < > = values in this interval not displayed.   GFR: Estimated Creatinine Clearance: 98.7 mL/min (A) (by C-G formula based on SCr of 1.67 mg/dL (H)). Liver Function Tests: No results for input(s): AST, ALT, ALKPHOS, BILITOT, PROT, ALBUMIN in the last 168 hours. No results for input(s): LIPASE, AMYLASE in the last 168 hours. No results for input(s): AMMONIA in the last 168 hours. Coagulation Profile: No results for input(s): INR, PROTIME in the last 168 hours. Cardiac Enzymes: Recent Labs  Lab 03/07/20 0602  CKTOTAL 138   BNP (last 3 results) No results for input(s): PROBNP in the last 8760 hours. HbA1C: No results for input(s): HGBA1C in the last 72 hours. CBG: Recent Labs  Lab 03/09/20 2015 03/10/20 0631 03/10/20 1126 03/10/20 1626 03/10/20 2059  GLUCAP  177* 121* 124* 109* 110*   Lipid Profile: No results for input(s): CHOL, HDL, LDLCALC, TRIG, CHOLHDL, LDLDIRECT in the last 72 hours. Thyroid Function Tests: No results for input(s): TSH, T4TOTAL, FREET4, T3FREE, THYROIDAB in the last 72 hours. Anemia Panel: No results for input(s): VITAMINB12, FOLATE, FERRITIN, TIBC, IRON, RETICCTPCT in the last 72 hours. Sepsis Labs: No results for input(s): PROCALCITON, LATICACIDVEN in the last 168 hours.  Recent Results (from the past 240 hour(s))  Surgical pcr screen     Status: None   Collection Time: 03/02/20  8:59 AM   Specimen: Nasal Mucosa; Nasal Swab  Result Value Ref Range Status   MRSA, PCR NEGATIVE NEGATIVE Final   Staphylococcus aureus NEGATIVE NEGATIVE Final    Comment: (NOTE) The Xpert SA Assay (FDA approved  for NASAL specimens in patients 58 years of age and older), is one component of a comprehensive surveillance program. It is not intended to diagnose infection nor to guide or monitor treatment. Performed at [facility], [address], [place], [place] [address]   Aerobic/Anaerobic Culture (surgical/deep wound)     Status: None   Collection Time: 03/02/20  3:37 PM   Specimen: Soft Tissue, Other  Result Value Ref Range Status   Specimen Description TISSUE LEFT FOOT  Final   Special Requests PT ON ROCEPHIN  Final   Gram Stain   Final    NO WBC SEEN ABUNDANT GRAM POSITIVE COCCI RARE GRAM NEGATIVE RODS Performed at [facility], [address], [place], [place] [address]    Culture   Final    FEW PSEUDOMONAS AERUGINOSA MIXED ANAEROBIC FLORA PRESENT.  CALL LAB IF FURTHER IID REQUIRED.    Report Status 03/06/2020 FINAL  Final   Organism ID, Bacteria PSEUDOMONAS AERUGINOSA  Final      Susceptibility   Pseudomonas aeruginosa - MIC*    CEFTAZIDIME 4 SENSITIVE Sensitive     CIPROFLOXACIN <=0.25 SENSITIVE Sensitive     GENTAMICIN <=1 SENSITIVE Sensitive     IMIPENEM 2 SENSITIVE Sensitive     PIP/TAZO <=4  SENSITIVE Sensitive     CEFEPIME 2 SENSITIVE Sensitive     * FEW PSEUDOMONAS AERUGINOSA  Aerobic/Anaerobic Culture (surgical/deep wound)     Status: None   Collection Time: 03/02/20  3:41 PM   Specimen: Soft Tissue, Other  Result Value Ref Range Status   Specimen Description TISSUE LEFT FOOT  Final   Special Requests SAMPLE B DEEP TISSUE PT ON ROCEPHIN  Final   Gram Stain NO WBC SEEN RARE GRAM POSITIVE COCCI   Final   Culture   Final    RARE ACTINOMYCES ODONTOLYTICUS RARE STREPTOCOCCUS ANGINOSIS FEW PARABACTEROIDES DISTASONIS BETA LACTAMASE POSITIVE Performed at [facility], [address], [place], [place] [address]    Report Status 03/05/2020 FINAL  Final  Culture, blood (routine x 2)     Status: None   Collection Time: 03/03/20  1:02 PM   Specimen: BLOOD  Result Value Ref Range Status   Specimen Description BLOOD RIGHT ANTECUBITAL  Final   Special Requests   Final    BOTTLES DRAWN AEROBIC AND ANAEROBIC Blood Culture adequate volume   Culture   Final    NO GROWTH 5 DAYS Performed at [facility], [address] [place], [place] [address]    Report Status 03/08/2020 FINAL  Final  Culture, blood (routine x 2)     Status: None   Collection Time: 03/03/20  1:14 PM   Specimen: BLOOD  Result Value Ref Range Status   Specimen Description BLOOD LEFT ANTECUBITAL  Final   Special Requests   Final    BOTTLES DRAWN AEROBIC AND ANAEROBIC Blood Culture adequate volume   Culture   Final    NO GROWTH 5 DAYS Performed at [facility], [address], [place], [place] [address]    Report Status 03/08/2020 FINAL  Final    Scheduled Meds: . sodium chloride   Intravenous Once  . amLODipine  10 mg Oral Daily  . gabapentin  300 mg Oral TID  . heparin  7,500 Units Subcutaneous Q8H  . insulin aspart  0-5 Units Subcutaneous QHS  . insulin aspart  0-9 Units Subcutaneous TID WC  . multivitamin with minerals  1 tablet Oral Daily  .  nutrition supplement (JUVEN)  1 packet  Oral BID BM  . pantoprazole  40 mg Oral Daily  . Ensure Max Protein  11 oz Oral TID  . senna-docusate  1 tablet Oral BID   Continuous Infusions: . methocarbamol (ROBAXIN) IV    . piperacillin-tazobactam (ZOSYN)  IV 3.375 g (03/11/20 3159)    Assessment & Plan:   Principal Problem:   Severe sepsis with acute organ dysfunction (HCC) Active Problems:   Hyponatremia   Diabetic polyneuropathy associated with type 2 diabetes mellitus (HCC)   Acute kidney failure (HCC)   Diabetic ulcer of left foot (HCC)   Infection of left foot   Necrotizing fasciitis (HCC)   Subacute osteomyelitis, left ankle and foot (HCC)   Acute osteomyelitis of left foot (HCC)  LOS: 12 days   Time spent:  Zannie Cove, MD [facility] 03/11/2020, 10:55 AM

## 2020-03-11 NOTE — Progress Notes (Signed)
Inpatient Rehabilitation Medication Review by a Pharmacist  A complete drug regimen review was completed for this patient to identify any potential clinically significant medication issues.  Clinically significant medication issues were identified:  no  Check AMION for pharmacist assigned to patient if future medication questions/issues arise during this admission.  Pharmacist comments:    - Antibiotics begun 11/14  > changed to Zosyn on 11/19.  Zosyn stops after 12am dose on 11/29 as planned.  -  Feraheme given on 11/26 prior to transfer to CIR > 2nd dose due 12/3.  Time spent performing this drug regimen review (minutes):  15 minutes   Dennie Fetters, [place] 03/11/2020 4:54 PM

## 2020-03-11 NOTE — Progress Notes (Signed)
Pt to 4W15 per bed. No complications noted. Plan of care, medications and histories reviewed. Pt in agreement. No further questions. Mylo Red, LPN

## 2020-03-11 NOTE — IPOC Note (Signed)
Individualized overall Plan of Care [facility]) Patient Details Name: Ronnie Shaw MRN: [id number] DOB: 10-Feb-1981  Admitting Diagnosis: Subacute osteomyelitis, left ankle and foot [facility])  Hospital Problems: Principal Problem:   Subacute osteomyelitis, left ankle and foot (HCC) Active Problems:   Below-knee amputation of left lower extremity (HCC)   Bacteremia   AKI (acute kidney injury) (HCC)   Essential hypertension     Functional Problem List: Nursing Edema, Endurance, Medication Management, Pain, Perception, Safety, Skin Integrity  PT Balance, Edema, Endurance  OT Balance, Endurance, Motor, Pain, Perception, Safety, Skin Integrity  SLP    TR         Basic ADL's: OT Grooming, Bathing, Dressing, Toileting     Advanced  ADL's: OT       Transfers: PT Bed Mobility, Bed to Chair, Car, Oncologist: PT Ambulation, Psychologist, prison and probation services, Stairs     Additional Impairments: OT None  SLP        TR      Anticipated Outcomes Item Anticipated Outcome  Self Feeding    Swallowing      Basic self-care  Mod I  Toileting  Mod I   Bathroom Transfers Mod I  Bowel/Bladder  mod I  Transfers  modified independent basic; supervision car  Locomotion  modified independent w/c x 200' controlled and community settings; supervision gait x 15' and up/down 1 step for home entry, with LRAD  Communication     Cognition     Pain  pain less than 2  Safety/Judgment  mod I   Therapy Plan: PT Intensity: Minimum of 1-2 x/day ,45 to 90 minutes PT Frequency: 5 out of 7 days PT Duration Estimated Length of Stay: 5-7 days OT Intensity: Minimum of 1-2 x/day, 45 to 90 minutes OT Frequency: 5 out of 7 days OT Duration/Estimated Length of Stay: 3-6 days      Team Interventions: Nursing Interventions Patient/Family Education, Disease Management/Prevention, Pain Management, Medication Management, Skin Care/Wound Management  PT interventions Ambulation/gait  training, Discharge planning, DME/adaptive equipment instruction, Functional mobility training, Psychosocial support, Splinting/orthotics, Therapeutic Activities, UE/LE Strength taining/ROM, Metallurgist training, Neuromuscular re-education, Patient/family education, Stair training, Therapeutic Exercise, Wheelchair propulsion/positioning  OT Interventions Warden/ranger, Firefighter, Discharge planning, Disease mangement/prevention, Fish farm manager, Functional mobility training, Pain management, Patient/family education, Psychosocial support, Self Care/advanced ADL retraining, Skin care/wound managment, Splinting/orthotics, Therapeutic Activities, UE/LE Strength taining/ROM, Therapeutic Exercise, Wheelchair propulsion/positioning  SLP Interventions    TR Interventions    SW/CM Interventions     Barriers to Discharge MD  Medical stability, Wound care, Weight, and Weight bearing restrictions  Nursing Other (comments)    PT      OT Decreased caregiver support, Home environment access/layout, Wound Care, Weight bearing restrictions, Behavior    SLP      SW       Team Discharge Planning: Destination: PT-Home ,OT- Home , SLP-  Projected Follow-up: PT-Outpatient PT, OT-  Home health OT, SLP-  Projected Equipment Needs: PT-Wheelchair (measurements), Wheelchair cushion (measurements), Rolling walker with 5" wheels, OT- 3 in 1 bedside comode, Tub/shower seat, SLP-  Equipment Details: PT-will need bariatric w/c and RW, OT-  Patient/family involved in discharge planning: PT- Patient,  OT-Patient, SLP-   MD ELOS: 3-5 days. Medical Rehab Prognosis:  Good Assessment: 39 year old male with history of HTN, sarcoidosis, T2DM with diabetic foot ulcers with osteomyelitis s/p right midfoot amputation 10/2019 transmetatarsal amputation--PWB and left foot ulcer with drainage and gangrenous changes. He was admitted  on 02/28/20 with fevers-T 102, leucocytosis-WBC  16.2 as well as acute on chronic renal failure-SCr 3.5 and Na- 126  due to severe sepsis due to foot abscess with necrotizing fasciitis . He was started on broad spectrum antibiotics and was taken to OR on 03/02/20 for extensive debridement with 4th and 5th MT amputation by Dr. Lajoyce Corners. Wound cultures positive for pseudomonas and actinomyces and blood cultures positive for Strep sanguis. ID consulted for input and antibiotics narrowed to Zosyn with end date 11/28 to complete 2 week antibiotic course. Dr. Carola Frost consulted for 2nd opinion and felt that stage amputation likely without good outcomes. Patient underwent L-BKA on 11/24 by Dr. Lajoyce Corners and wound VAC placed. Leucocytosis resolving and ABLA treated with PRBC as well as IV iron. Patient PWB RLE and NWB L-BKA.  Patient with resulting functional deficits with mobility, transfers, endurance, self-care.  We will set goals for Mod I PT/OT.  Due to the current state of emergency, patients may not be receiving their 3-hours of Medicare-mandated therapy.  See Team Conference Notes for weekly updates to the plan of care
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
## 2020-03-11 NOTE — Progress Notes (Addendum)
Inpatient Rehabilitation Admissions Coordinator  Inpatient rehab consult received. I met with patient at bedside and discussed goals and expectations of a  possible Cir admit. He is in agreement to admit. I will contact Dr. Broadus John to clarify when patient felt medically ready to d/c to Cir.  Danne Baxter, RN, MSN Rehab Admissions Coordinator [phone] 03/11/2020 10:54 AM   Spoke with Dr. Broadus John, I will make the arrnangements to admit today.  Danne Baxter, RN, MSN Rehab Admissions Coordinator [phone] 03/11/2020 11:28 AM

## 2020-03-12 ENCOUNTER — Inpatient Hospital Stay (HOSPITAL_COMMUNITY): Payer: Self-pay

## 2020-03-12 ENCOUNTER — Inpatient Hospital Stay (HOSPITAL_COMMUNITY): Payer: Self-pay | Admitting: Occupational Therapy

## 2020-03-12 DIAGNOSIS — M86272 Subacute osteomyelitis, left ankle and foot: Secondary | ICD-10-CM

## 2020-03-12 DIAGNOSIS — I10 Essential (primary) hypertension: Secondary | ICD-10-CM

## 2020-03-12 DIAGNOSIS — S88112A Complete traumatic amputation at level between knee and ankle, left lower leg, initial encounter: Secondary | ICD-10-CM

## 2020-03-12 DIAGNOSIS — N179 Acute kidney failure, unspecified: Secondary | ICD-10-CM

## 2020-03-12 DIAGNOSIS — R7881 Bacteremia: Secondary | ICD-10-CM

## 2020-03-12 LAB — COMPREHENSIVE METABOLIC PANEL
ALT: 42 U/L (ref 0–44)
AST: 32 U/L (ref 15–41)
Albumin: 1.8 g/dL — ABNORMAL LOW (ref 3.5–5.0)
Alkaline Phosphatase: 85 U/L (ref 38–126)
Anion gap: 8 (ref 5–15)
BUN: 35 mg/dL — ABNORMAL HIGH (ref 6–20)
CO2: 24 mmol/L (ref 22–32)
Calcium: 8.7 mg/dL — ABNORMAL LOW (ref 8.9–10.3)
Chloride: 104 mmol/L (ref 98–111)
Creatinine, Ser: 1.82 mg/dL — ABNORMAL HIGH (ref 0.61–1.24)
GFR, Estimated: 48 mL/min — ABNORMAL LOW (ref >60)
Glucose, Bld: 138 mg/dL — ABNORMAL HIGH (ref 70–99)
Potassium: 5 mmol/L (ref 3.5–5.1)
Sodium: 136 mmol/L (ref 135–145)
Total Bilirubin: 0.2 mg/dL — ABNORMAL LOW (ref 0.3–1.2)
Total Protein: 7.2 g/dL (ref 6.5–8.1)

## 2020-03-12 LAB — CBC WITH DIFFERENTIAL/PLATELET
Abs Immature Granulocytes: 0.13 10*3/uL — ABNORMAL HIGH (ref 0.00–0.07)
Basophils Absolute: 0.1 10*3/uL (ref 0.0–0.1)
Basophils Relative: 1 %
Eosinophils Absolute: 0.1 10*3/uL (ref 0.0–0.5)
Eosinophils Relative: 1 %
HCT: 24.2 % — ABNORMAL LOW (ref 39.0–52.0)
Hemoglobin: 7.4 g/dL — ABNORMAL LOW (ref 13.0–17.0)
Immature Granulocytes: 1 %
Lymphocytes Relative: 23 %
Lymphs Abs: 2.5 10*3/uL (ref 0.7–4.0)
MCH: 28.6 pg (ref 26.0–34.0)
MCHC: 30.6 g/dL (ref 30.0–36.0)
MCV: 93.4 fL (ref 80.0–100.0)
Monocytes Absolute: 0.6 10*3/uL (ref 0.1–1.0)
Monocytes Relative: 5 %
Neutro Abs: 7.4 10*3/uL (ref 1.7–7.7)
Neutrophils Relative %: 69 %
Platelets: 433 10*3/uL — ABNORMAL HIGH (ref 150–400)
RBC: 2.59 MIL/uL — ABNORMAL LOW (ref 4.22–5.81)
RDW: 16.2 % — ABNORMAL HIGH (ref 11.5–15.5)
WBC: 10.7 10*3/uL — ABNORMAL HIGH (ref 4.0–10.5)
nRBC: 0 % (ref 0.0–0.2)

## 2020-03-12 LAB — GLUCOSE, CAPILLARY
Glucose-Capillary: 119 mg/dL — ABNORMAL HIGH (ref 70–99)
Glucose-Capillary: 139 mg/dL — ABNORMAL HIGH (ref 70–99)
Glucose-Capillary: 135 mg/dL — ABNORMAL HIGH (ref 70–99)
Glucose-Capillary: 99 mg/dL (ref 70–99)

## 2020-03-12 NOTE — Progress Notes (Signed)
Initial Nutrition Assessment  DOCUMENTATION CODES:   Morbid obesity  INTERVENTION:   30 ml ProSource Plus TID, each supplement provides 100 kcals and 15 grams protein.   Change to Ensure Enlive po BID, each supplement provides 350 kcal and 20 grams of protein  Juven BID, each packet provides 80 calories, 8 grams of carbohydrate, 2.5  grams of protein (collagen), 7 grams of L-arginine and 7 grams of L-glutamine; supplement contains CaHMB, Vitamins C, E, B12 and Zinc to promote wound healing  MVI with Minerals   NUTRITION DIAGNOSIS:   Increased nutrient needs related to wound healing as evidenced by estimated needs.  GOAL:   Patient will meet greater than or equal to 90% of their needs  MONITOR:   PO intake, Supplement acceptance, Skin, Labs  REASON FOR ASSESSMENT:   Consult Diet education, Assessment of nutrition requirement/status  ASSESSMENT:   39 yo with hx of DM and diabetic foot ulcers s/p R midfoot amputation admitted to hospital with severe sepsis secondary to diabetic foot ulcer requiring L foot 4th and 5th ray amputation requiring debridement and wound vac application and ultimately required transtibial amputation on 11/24.  Pt admitted to CIR on 11/26.   11/17 s/p L foot 4th and 5th ray amputation; extensive excisional debridement of skin and soft tissue muscle and fascia; application of wound VAC 11/19 s/p repeat debridement L foot 11/24 L. Transtibial amputation with wound vac application  Unable to reach pt by phone  Recorded po intake 15% of breakfast and 80% at dinner yesterday. Currently on Carb Modified diet. Noted order for Nepro TID and Juven BID  Pt eating relatively well prior to procedure on 11/24, eating 50-100% of meals  No recorded output via wound VAC  Labs: CBGs 106-168, potassium 5.0 (wdl), Creatinine 1.82, BUN 35 Meds: ss novolog with meals and at bedtime, MVI with Minerals   Diet Order:   Diet Order            Diet Carb Modified  Fluid consistency: Thin; Room service appropriate? Yes  Diet effective now                 EDUCATION NEEDS:   Education needs have been addressed  Skin:  Skin Integrity Issues:: Wound VAC Wound Vac: L. transtibial amputation on 11/24 Other: Non-pressure wound R foot with previous transmet amputation  Last BM:  11/26  Height:   Ht Readings from Last 1 Encounters:  03/11/20 6\' 1"  (1.854 m)    Weight:   Wt Readings from Last 1 Encounters:  03/10/20 (!) 168.2 kg    BMI:  Body mass index is 48.92 kg/m.  Estimated Nutritional Needs:   Kcal:  2500-2700 kcals  Protein:  155-190 g  Fluid:  >/= 2.2 L   03/12/20 MS, RDN, LDN, CNSC Registered Dietitian III Clinical Nutrition RD Pager and On-Call Pager Number Located in [place]

## 2020-03-12 NOTE — Progress Notes (Addendum)
[facility] PHYSICAL MEDICINE & REHABILITATION PROGRESS NOTE  Subjective/Complaints: Patient seen laying in bed this morning.  He states he slept well overnight.  He is about to work with therapies.  Therapies walk-in and attempts to provide introduction, however patient putting on his tablet, when asked, patient states that he is with the therapist.  Discussed the purpose of therapies.  ROS: Denies CP, SOB, N/V/D  Objective: Vital Signs: Blood pressure (!) 153/90, pulse (!) 101, temperature 98.2 F (36.8 C), temperature source Oral, resp. rate 20, height 6\' 1"  (1.854 m), SpO2 96 %. No results found. Recent Labs    03/11/20 0127 03/12/20 0503  WBC 11.0* 10.7*  HGB 7.6* 7.4*  HCT 24.9* 24.2*  PLT 430* 433*   Recent Labs    03/11/20 0127 03/12/20 0503  NA 135 136  K 5.2* 5.0  CL 103 104  CO2 22 24  GLUCOSE 134* 138*  BUN 32* 35*  CREATININE 1.67* 1.82*  CALCIUM 8.6* 8.7*    Intake/Output Summary (Last 24 hours) at 03/12/2020 1330 Last data filed at 03/12/2020 0700 Gross per 24 hour  Intake 232.8 ml  Output 1600 ml  Net -1367.2 ml        Physical Exam: BP (!) 153/90 (BP Location: Left Arm)   Pulse (!) 101   Temp 98.2 F (36.8 C) (Oral)   Resp 20   Ht 6\' 1"  (1.854 m)   SpO2 96%   BMI 48.92 kg/m  Constitutional: No distress . Vital signs reviewed.  Morbidly obese. HENT: Normocephalic.  Atraumatic. Eyes: EOMI. No discharge. Cardiovascular: No JVD.  RRR. Respiratory: Normal effort.  No stridor.  Bilateral clear to auscultation. GI: Non-distended.  BS +. Skin: Warm and dry.  Right forefoot site with lateral dehiscence + VAC to left BKA Psych: Flat.  Disengaged. Musc: Right lower extremity edema with forefoot tenderness Left BKA with edema and tenderness Neuro: Alert Right lower extremity: Hip flexion, knee extension 4+/5, ankle dorsiflexion 4/5 (some pain inhibition), unchanged Left lower extremity: Flexion, knee extension 3+/5 (pain  inhibition)  Assessment/Plan: 1. Functional deficits which require 3+ hours per day of interdisciplinary therapy in a comprehensive inpatient rehab setting.  Physiatrist is providing close team supervision and 24 hour management of active medical problems listed below.  Physiatrist and rehab team continue to assess barriers to discharge/monitor patient progress toward functional and medical goals   Care Tool:  Bathing    Body parts bathed by patient: Right arm, Left arm, Chest, Abdomen, Front perineal area, Right upper leg, Left upper leg     Body parts n/a: Left lower leg   Bathing assist Assist Level: Supervision/Verbal cueing (refused assist from therapist)     Upper Body Dressing/Undressing Upper body dressing Upper body dressing/undressing activity did not occur (including orthotics): Environmental limitations (IV prohibiting donning shirt)      Upper body assist      Lower Body Dressing/Undressing Lower body dressing      What is the patient wearing?: Pants     Lower body assist Assist for lower body dressing: Minimal Assistance - Patient > 75%     Toileting Toileting    Toileting assist Assist for toileting: Independent     Transfers Chair/bed transfer  Transfers assist     Chair/bed transfer assist level: Contact Guard/Touching assist     Locomotion Ambulation   Ambulation assist              Walk 10 feet activity   Assist  Walk 50 feet activity   Assist           Walk 150 feet activity   Assist           Walk 10 feet on uneven surface  activity   Assist           Wheelchair     Assist               Wheelchair 50 feet with 2 turns activity    Assist            Wheelchair 150 feet activity     Assist           Medical Problem List and Plan: 1.  Deficits with mobility, transfers, endurance, self-care secondary to left BKA.  Begin CIR evaluations 2.   Antithrombotics: -DVT/anticoagulation:  Pharmaceutical: Lovenox             -antiplatelet therapy: N/a 3. Pain Management:  Oxycodone 15 mg prn effective             Monitor with increased exertion, particularly for phantom limb pain. 4. Mood: LCSW to follow for evaluation and support.              -antipsychotic agents: N/A 5. Neuropsych: This patient is capable of making decisions on his own behalf. 6. Right transmet wound/Skin/Wound Care: Monitor wound for healing--resume trental and NTG patch. Dry dressing daily.  Added vitamin C and juven to help promote wound healing.   DC wound VAC ~12/1 7. Fluids/Electrolytes/Nutrition: monitor              Continue to monitor right forefoot amputation wounds 8. HTN: Will continue to monitor BP.   Norvasc ordered.  Monitor with increased mobility 9. Acute on chronic anemia: IV iron X 2--11/26 and 12/3             Hemoglobin 7.4 on 11/27, labs ordered for Monday 10. Acute on chronic renal failure: Baseline SCr- 1.6.  Improving from 3.58-->1.67. Monitor persistent hyperkalemia.              Creatinine 1.82 on 11/27, labs ordered for Monday  Encourage fluids 11. Group C strep bacteremia/Wound infection: Leucocytosis resolving.  Continue Zosyn thorough 11/28 per ID  12. 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 Continue to encourage weight loss to help promote mobility and health.  13. T2DM with hyperglycemia: Was non-compliant with Lantus per office notes. Continue CM diet--added low potassium due to persistent hyperkalemia: Will monitor BS ac/hs and use SSI for tighter control.   Monitor with increased mobility  14. Malnutrition: Albumin 1.8--continue nutrtional supplement (will change to nephro due to elevated K+) and add juven to promote wound healing.  15.  Leukocytosis  WBCs 10.7 on 11/27  See #11  LOS: 1 days A FACE TO FACE EVALUATION WAS PERFORMED  Dnasia Gauna [name] 03/12/2020, 1:30 PM
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
## 2020-03-12 NOTE — Progress Notes (Signed)
Occupational Therapy Session Note  Patient Details  Name: Ronnie Shaw MRN: [id number] Date of Birth: 03-23-1981  Today's Date: 03/12/2020 OT Individual Time: 5035-4656 OT Individual Time Calculation (min): 24 min  and Today's Date: 03/12/2020 OT Missed Time: 36 Minutes Missed Time Reason: Patient unwilling/refused to participate without medical reason;Patient fatigue   Short Term Goals: Week 1:  OT Short Term Goal 1 (Week 1): STG = LTGs due to short ELOS  Skilled Therapeutic Interventions/Progress Updates:  Treatment session with focus on care for residual limb.  Pt asleep in recliner upon arrival.  Pt did open eyes to therapist but kept eyes closed majority of session.  Therapist directed pt to "First Step" publication and educated on various resources regarding pain, healing, wound care, and eventual prosthetic fit.  Therapist provided pt with additional handout regarding limb wrapping, skin inspection, and desensitization strategies for residual limb. Pt continued to keep eyes closed majority of session, with minimal engagement.  Pt requested to rest until next therapy session.  Pt missed remaining 36 mins due to fatigue and refusal.  Therapy Documentation Precautions:  Precautions Precautions: Fall, Other (comment) Precaution Comments: L BKA  Required Braces or Orthoses: Other Brace Other Brace: darco shoe Restrictions Weight Bearing Restrictions: Yes RLE Weight Bearing: Partial weight bearing LLE Weight Bearing: Non weight bearing General: General OT Amount of Missed Time: 36 Minutes Pain: Pain Assessment Pain Scale: 0-10 Pain Score: 5    Therapy/Group: Individual Therapy  Rosalio Loud 03/12/2020, 11:47 AM

## 2020-03-13 DIAGNOSIS — D649 Anemia, unspecified: Secondary | ICD-10-CM

## 2020-03-13 DIAGNOSIS — R7309 Other abnormal glucose: Secondary | ICD-10-CM

## 2020-03-13 DIAGNOSIS — T8130XA Disruption of wound, unspecified, initial encounter: Secondary | ICD-10-CM

## 2020-03-13 LAB — GLUCOSE, CAPILLARY
Glucose-Capillary: 116 mg/dL — ABNORMAL HIGH (ref 70–99)
Glucose-Capillary: 161 mg/dL — ABNORMAL HIGH (ref 70–99)
Glucose-Capillary: 92 mg/dL (ref 70–99)
Glucose-Capillary: 130 mg/dL — ABNORMAL HIGH (ref 70–99)

## 2020-03-13 MED ORDER — ENSURE ENLIVE PO LIQD
237.0000 mL | Freq: Two times a day (BID) | ORAL | Status: DC
  Administered 2020-03-13 – 2020-03-17 (×9): 237 mL via ORAL

## 2020-03-13 MED ORDER — PROSOURCE PLUS PO LIQD
30.0000 mL | Freq: Three times a day (TID) | ORAL | Status: DC
  Administered 2020-03-13 (×2): 30 mL via ORAL

## 2020-03-13 NOTE — Progress Notes (Signed)
PHYSICAL MEDICINE & REHABILITATION PROGRESS NOTE  Subjective/Complaints: Patient seen laying in bed this morning.  He states he slept well overnight.  Surprisingly, he states he had a good first day of therapy and enjoyed it.  ROS: Denies CP, SOB, N/V/D  Objective: Vital Signs: Blood pressure (!) 149/87, pulse 99, temperature 98.9 F (37.2 C), resp. rate 20, height 6\' 1"  (1.854 m), SpO2 100 %. No results found. Recent Labs    03/11/20 0127 03/12/20 0503  WBC 11.0* 10.7*  HGB 7.6* 7.4*  HCT 24.9* 24.2*  PLT 430* 433*   Recent Labs    03/11/20 0127 03/12/20 0503  NA 135 136  K 5.2* 5.0  CL 103 104  CO2 22 24  GLUCOSE 134* 138*  BUN 32* 35*  CREATININE 1.67* 1.82*  CALCIUM 8.6* 8.7*    Intake/Output Summary (Last 24 hours) at 03/13/2020 1659 Last data filed at 03/13/2020 1400 Gross per 24 hour  Intake 620 ml  Output 2275 ml  Net -1655 ml        Physical Exam: BP (!) 149/87 (BP Location: Left Arm)   Pulse 99   Temp 98.9 F (37.2 C)   Resp 20   Ht 6\' 1"  (1.854 m)   SpO2 100%   BMI 48.92 kg/m  Constitutional: No distress . Vital signs reviewed.  Morbidly obese. HENT: Normocephalic.  Atraumatic. Eyes: EOMI. No discharge. Cardiovascular: No JVD.  RRR. Respiratory: Normal effort.  No stridor.  Bilateral clear to auscultation. GI: Non-distended.  BS +. Skin: Warm and dry.  Right forefoot with dressing CDI. +VAC to left BKA Psych: Flat.  Normal behavior. Musc: Right lower extremity edema with forefoot tenderness Left BKA with edema and tenderness Neuro: Alert Right lower extremity: Hip flexion, knee extension 4+/5, ankle dorsiflexion 4/5 (some pain inhibition), stable  Left lower extremity: Flexion, knee extension 3+/5 (pain inhibition)  Assessment/Plan: 1. Functional deficits which require 3+ hours per day of interdisciplinary therapy in a comprehensive inpatient rehab setting.  Physiatrist is providing close team supervision and 24 hour  management of active medical problems listed below.  Physiatrist and rehab team continue to assess barriers to discharge/monitor patient progress toward functional and medical goals   Care Tool:  Bathing    Body parts bathed by patient: Right arm, Left arm, Chest, Abdomen, Front perineal area, Right upper leg, Left upper leg     Body parts n/a: Left lower leg   Bathing assist Assist Level: Supervision/Verbal cueing (refused assist from therapist)     Upper Body Dressing/Undressing Upper body dressing Upper body dressing/undressing activity did not occur (including orthotics): Environmental limitations (IV prohibiting donning shirt)      Upper body assist      Lower Body Dressing/Undressing Lower body dressing      What is the patient wearing?: Pants     Lower body assist Assist for lower body dressing: Minimal Assistance - Patient > 75%     Toileting Toileting    Toileting assist Assist for toileting: Independent     Transfers Chair/bed transfer  Transfers assist     Chair/bed transfer assist level: Supervision/Verbal cueing     Locomotion Ambulation   Ambulation assist   Ambulation activity did not occur: Safety/medical concerns (recent revision R transmet amputation RLE, BKA LLE)          Walk 10 feet activity   Assist           Walk 50 feet activity   Assist  Walk 150 feet activity   Assist           Walk 10 feet on uneven surface  activity   Assist           Wheelchair     Assist Will patient use wheelchair at discharge?: Yes Type of Wheelchair: Manual    Wheelchair assist level: Supervision/Verbal cueing Max wheelchair distance: 100    Wheelchair 50 feet with 2 turns activity    Assist        Assist Level: Supervision/Verbal cueing   Wheelchair 150 feet activity     Assist  Wheelchair 150 feet activity did not occur: Safety/medical concerns (fatigue)        Medical  Problem List and Plan: 1.  Deficits with mobility, transfers, endurance, self-care secondary to left BKA.  Continue CIR 2.  Antithrombotics: -DVT/anticoagulation:  Pharmaceutical: Lovenox             -antiplatelet therapy: N/a 3. Pain Management:  Oxycodone 15 mg prn   Controlled with meds on 11/28             Monitor with increased exertion, particularly for phantom limb pain. 4. Mood: LCSW to follow for evaluation and support.              -antipsychotic agents: N/A 5. Neuropsych: This patient is capable of making decisions on his own behalf. 6. Right transmet wound/Skin/Wound Care: Monitor wound for healing--resume trental and NTG patch. Dry dressing daily.  Added vitamin C and juven to help promote wound healing.   DC wound VAC ~12/1 7. Fluids/Electrolytes/Nutrition: monitor              Continue to monitor right forefoot wound dehiscence  Continue mild left BKA site 8. HTN: Will continue to monitor BP.   Norvasc ordered.  Elevated on 11/28, will consider medication adjustments if persistent  Monitor with increased mobility 9. Acute on chronic anemia: IV iron X 2--11/26 and 12/3             Hemoglobin 7.4 on 11/27, labs ordered for tomorrow 10. Acute on chronic renal failure: Baseline SCr- 1.6.  Improving from 3.58-->1.67. Monitor persistent hyperkalemia.              Creatinine 1.82 on 11/27, labs ordered for tomorrow  Encourage fluids 11. Group C strep bacteremia/Wound infection: Leucocytosis resolving.  Continue Zosyn thorough 11/28 per ID  12. 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 Continue to encourage weight loss to help promote mobility and health.  13. T2DM with hyperglycemia: Was non-compliant with Lantus per office notes. Continue CM diet--added low potassium due to persistent hyperkalemia: Will monitor BS ac/hs and use SSI for tighter control.   Labile on 11/20, monitor for trend  Monitor with increased mobility  14. Malnutrition: Albumin 1.8--continue nutrtional supplement  (will change to nephro due to elevated K+) and add juven to promote wound healing.  15.  Leukocytosis  WBCs 10.7 on 11/27  See #11  LOS: 2 days A FACE TO FACE EVALUATION WAS PERFORMED  Ronnie Shaw Ronnie Shaw 03/13/2020, 4:59 PM

## 2020-03-14 ENCOUNTER — Inpatient Hospital Stay (HOSPITAL_COMMUNITY): Payer: Self-pay

## 2020-03-14 ENCOUNTER — Inpatient Hospital Stay (HOSPITAL_COMMUNITY): Payer: Self-pay | Admitting: Physical Therapy

## 2020-03-14 ENCOUNTER — Inpatient Hospital Stay (HOSPITAL_COMMUNITY): Payer: Self-pay | Admitting: Occupational Therapy

## 2020-03-14 LAB — SURGICAL PATHOLOGY

## 2020-03-14 LAB — CBC
HCT: 25.2 % — ABNORMAL LOW (ref 39.0–52.0)
Hemoglobin: 7.8 g/dL — ABNORMAL LOW (ref 13.0–17.0)
MCH: 28.3 pg (ref 26.0–34.0)
MCHC: 31 g/dL (ref 30.0–36.0)
MCV: 91.3 fL (ref 80.0–100.0)
Platelets: 392 10*3/uL (ref 150–400)
RBC: 2.76 MIL/uL — ABNORMAL LOW (ref 4.22–5.81)
RDW: 15.9 % — ABNORMAL HIGH (ref 11.5–15.5)
WBC: 7.8 10*3/uL (ref 4.0–10.5)
nRBC: 0 % (ref 0.0–0.2)

## 2020-03-14 LAB — BASIC METABOLIC PANEL
Anion gap: 10 (ref 5–15)
BUN: 31 mg/dL — ABNORMAL HIGH (ref 6–20)
CO2: 23 mmol/L (ref 22–32)
Calcium: 8.9 mg/dL (ref 8.9–10.3)
Chloride: 103 mmol/L (ref 98–111)
Creatinine, Ser: 1.75 mg/dL — ABNORMAL HIGH (ref 0.61–1.24)
GFR, Estimated: 51 mL/min — ABNORMAL LOW (ref >60)
Glucose, Bld: 141 mg/dL — ABNORMAL HIGH (ref 70–99)
Potassium: 5 mmol/L (ref 3.5–5.1)
Sodium: 136 mmol/L (ref 135–145)

## 2020-03-14 LAB — GLUCOSE, CAPILLARY
Glucose-Capillary: 147 mg/dL — ABNORMAL HIGH (ref 70–99)
Glucose-Capillary: 110 mg/dL — ABNORMAL HIGH (ref 70–99)
Glucose-Capillary: 113 mg/dL — ABNORMAL HIGH (ref 70–99)
Glucose-Capillary: 151 mg/dL — ABNORMAL HIGH (ref 70–99)

## 2020-03-14 NOTE — Progress Notes (Signed)
[facility] PHYSICAL MEDICINE & REHABILITATION PROGRESS NOTE  Subjective/Complaints: No complaints this morning. Denies pain, constipation. Sleeping has been so-so. Prefers no medication. Willing to try lavender drops on forehead at night- nursing order placed  ROS: Denies CP, SOB, N/V/D  Objective: Vital Signs: Blood pressure 115/82, pulse 88, temperature 98 F (36.7 C), temperature source Oral, resp. rate 18, height 6\' 1"  (1.854 m), SpO2 100 %. No results found. Recent Labs    03/12/20 0503 03/14/20 0820  WBC 10.7* 7.8  HGB 7.4* 7.8*  HCT 24.2* 25.2*  PLT 433* 392   Recent Labs    03/12/20 0503 03/14/20 0820  NA 136 136  K 5.0 5.0  CL 104 103  CO2 24 23  GLUCOSE 138* 141*  BUN 35* 31*  CREATININE 1.82* 1.75*  CALCIUM 8.7* 8.9    Intake/Output Summary (Last 24 hours) at 03/14/2020 03/16/2020 Last data filed at 03/14/2020 03/16/2020 Gross per 24 hour  Intake 200 ml  Output 2325 ml  Net -2125 ml        Physical Exam: BP 115/82 (BP Location: Left Arm)   Pulse 88   Temp 98 F (36.7 C) (Oral)   Resp 18   Ht 6\' 1"  (1.854 m)   SpO2 100%   BMI 48.92 kg/m  Gen: no distress, normal appearing HEENT: oral mucosa pink and moist, NCAT Cardio: Reg rate Chest: normal effort, normal rate of breathing Abd: soft, non-distended Ext: no edema Skin: Warm and dry.  Right forefoot with dressing CDI. +VAC to left BKA Psych: Flat.  Normal behavior. Musc: Right lower extremity edema with forefoot tenderness Left BKA with edema and tenderness Neuro: Alert Right lower extremity: Hip flexion, knee extension 4+/5, ankle dorsiflexion 4/5 (some pain inhibition), stable  Left lower extremity: Flexion, knee extension 3+/5 (pain inhibition)  Assessment/Plan: 1. Functional deficits which require 3+ hours per day of interdisciplinary therapy in a comprehensive inpatient rehab setting.  Physiatrist is providing close team supervision and 24 hour management of active medical problems listed  below.  Physiatrist and rehab team continue to assess barriers to discharge/monitor patient progress toward functional and medical goals   Care Tool:  Bathing    Body parts bathed by patient: Right arm, Left arm, Chest, Abdomen, Front perineal area, Right upper leg, Left upper leg     Body parts n/a: Left lower leg   Bathing assist Assist Level: Supervision/Verbal cueing (refused assist from therapist)     Upper Body Dressing/Undressing Upper body dressing Upper body dressing/undressing activity did not occur (including orthotics): Environmental limitations (IV prohibiting donning shirt)      Upper body assist      Lower Body Dressing/Undressing Lower body dressing      What is the patient wearing?: Pants     Lower body assist Assist for lower body dressing: Minimal Assistance - Patient > 75%     Toileting Toileting    Toileting assist Assist for toileting: Independent (urinal)     Transfers Chair/bed transfer  Transfers assist     Chair/bed transfer assist level: Supervision/Verbal cueing     Locomotion Ambulation   Ambulation assist   Ambulation activity did not occur: Safety/medical concerns (recent revision R transmet amputation RLE, BKA LLE)          Walk 10 feet activity   Assist           Walk 50 feet activity   Assist           Walk 150 feet activity  Assist           Walk 10 feet on uneven surface  activity   Assist           Wheelchair     Assist Will patient use wheelchair at discharge?: Yes Type of Wheelchair: Manual    Wheelchair assist level: Supervision/Verbal cueing Max wheelchair distance: 100    Wheelchair 50 feet with 2 turns activity    Assist        Assist Level: Supervision/Verbal cueing   Wheelchair 150 feet activity     Assist  Wheelchair 150 feet activity did not occur:  (fatigue)   Assist Level: Moderate Assistance - Patient 50 - 74%    Medical Problem List  and Plan: 1.  Deficits with mobility, transfers, endurance, self-care secondary to left BKA.  Continue CIR 2.  Antithrombotics: -DVT/anticoagulation:  Pharmaceutical: Lovenox             -antiplatelet therapy: N/a 3. Pain Management:  Oxycodone 15 mg prn   Controlled with meds 11/29.              Monitor with increased exertion, particularly for phantom limb pain. 4. Mood: LCSW to follow for evaluation and support.              -antipsychotic agents: N/A 5. Neuropsych: This patient is capable of making decisions on his own behalf. 6. Right transmet wound/Skin/Wound Care: Monitor wound for healing--resume trental and NTG patch. Dry dressing daily.  Added vitamin C and juven to help promote wound healing.   DC wound VAC ~12/1 7. Fluids/Electrolytes/Nutrition: monitor              Continue to monitor right forefoot wound dehiscence  Continue mild left BKA site 8. HTN: Will continue to monitor BP.   Norvasc ordered.  11/29: well controlled.   Monitor with increased mobility 9. Acute on chronic anemia: IV iron X 2--11/26 and 12/3             Hemoglobin 7.4 on 11/27, up to 7.8 on 11/29 10. Acute on chronic renal failure: Baseline SCr- 1.6.  Improving from 3.58-->1.67. Monitor persistent hyperkalemia.              Creatinine 1.82 on 11/27, down to 1.75 on 11/29  Encourage fluids 11. Group C strep bacteremia/Wound infection: Leucocytosis resolving.  Continue Zosyn thorough 11/28 per ID  12. 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 Continue to encourage weight loss to help promote mobility and health.  13. T2DM with hyperglycemia: Was non-compliant with Lantus per office notes. Continue CM diet--added low potassium due to persistent hyperkalemia: Will monitor BS ac/hs and use SSI for tighter control.   Labile on 11/20, monitor for trend  Monitor with increased mobility  14. Malnutrition: Albumin 1.8--continue nutrtional supplement (will change to nephro due to elevated K+) and add juven to promote wound  healing.  15.  Leukocytosis  WBCs 10.7 on 11/27, down to 7.8 on 11/29 16. Insomnia: lavender drops on forehead at night.   LOS: 3 days A FACE TO FACE EVALUATION WAS PERFORMED  Drema Pry Saki Legore 03/14/2020, 9:52 AM

## 2020-03-14 NOTE — Care Management (Signed)
Inpatient Rehabilitation Center Individual Statement of Services  Patient Name:  Ronnie Shaw  Date:  03/14/2020  Welcome to the Inpatient Rehabilitation Center.  Our goal is to provide you with an individualized program based on your diagnosis and situation, designed to meet your specific needs.  With this comprehensive rehabilitation program, you will be expected to participate in at least 3 hours of rehabilitation therapies Monday-Friday, with modified therapy programming on the weekends.  Your rehabilitation program will include the following services:  Physical Therapy (PT), Occupational Therapy (OT), 24 hour per day rehabilitation nursing, Therapeutic Recreaction (TR), Psychology, Neuropsychology, Care Coordinator, Rehabilitation Medicine, Nutrition Services, Pharmacy Services and Other  Weekly team conferences will be held on Wednesdays to discuss your progress.  Your Inpatient Rehabilitation Care Coordinator will talk with you frequently to get your input and to update you on team discussions.  Team conferences with you and your family in attendance may also be held.  Expected length of stay: 3-7 days   Overall anticipated outcome: Independent with an assistive device  Depending on your progress and recovery, your program may change. Your Inpatient Rehabilitation Care Coordinator will coordinate services and will keep you informed of any changes. Your Inpatient Rehabilitation Care Coordinator's name and contact numbers are listed  below.  The following services may also be recommended but are not provided by the Inpatient Rehabilitation Center:   Driving Evaluations  Home Health Rehabiltiation Services  Outpatient Rehabilitation Services  Vocational Rehabilitation   Arrangements will be made to provide these services after discharge if needed.  Arrangements include referral to agencies that provide these services.  Your insurance has been verified to be:  Uninsured  Your  primary doctor is:  Bertram Denver  Pertinent information will be shared with your doctor and your insurance company.  Inpatient Rehabilitation Care Coordinator:  Dossie Der, [place] [phone] or Luna Glasgow  Information discussed with and copy given to patient by: Gretchen Short, 03/14/2020, 2:55 PM

## 2020-03-14 NOTE — Progress Notes (Signed)
Occupational Therapy Session Note  Patient Details  Name: Ronnie Shaw MRN: [id number] Date of Birth: [birth year]  Today's Date: 03/14/2020 OT Individual Time: 8937-3428 OT Individual Time Calculation (min): 81 min    Short Term Goals: Week 1:  OT Short Term Goal 1 (Week 1): STG = LTGs due to short ELOS  Skilled Therapeutic Interventions/Progress Updates:    Patient seated in recliner, cooperative.  Reviewed edema management techniques.  Reviewed amputee coalition information packet and resources available.  Patient states that he feels confident with mobility and self care due to past experiences.  He was open to strategies for safe sit pivot transfers and appropriate w/c set up.  He demonstrated sit pivot to/from recliner and w/c with CS/set up.  He requires assistance for w/c management (foot rests and positioning)   Adl (bathing and dressing) completed at sink with set up and distant supervision.  He required assistance to propel w/c to and from therapy gym.  Completed UB ergometer x 6 minutes.  Returned to recliner at close of session.  Call bell and tray table in reach.    Therapy Documentation Precautions:  Precautions Precautions: Fall, Other (comment) Precaution Comments: L BKA  Required Braces or Orthoses: Other Brace Other Brace: darco shoe Restrictions Weight Bearing Restrictions: Yes RLE Weight Bearing: Non weight bearing LLE Weight Bearing: Non weight bearing  Therapy/Group: Individual Therapy  Barrie Lyme 03/14/2020, 7:52 AM

## 2020-03-14 NOTE — Progress Notes (Signed)
Physical Therapy Session Note  Patient Details  Name: Ronnie Shaw MRN: [id number] Date of Birth: 05/28/1980  Today's Date: 03/14/2020 PT Individual Time: 0915-1005 PT Individual Time Calculation (min): 50 min   Short Term Goals: Week 1:  PT Short Term Goal 1 (Week 1): = LTGs due to ELOS  Skilled Therapeutic Interventions/Progress Updates:    PAIN 4/10 w/activity, rest and repositioning as needed  Pt initially on commode and asking for additional time for BM.  10 min missed time due to toileting.  Upon therapist return pt oob in wc attempting to reconnect vac after BM. Therapist re acewrapped R foot and secured Darco shoe. Pt propels wc 167ft w/bilat UEs, encouragement, pt not fond of pushing himself but performs w/encouragement.  Sit to stand  in parallel bars w/cga, pt resistant to therapist guarding  Gait 84ft forward/backwards w/close supervision, cues for safe pace, pt resists guarding by therapist.  Discussed need to decrease pace and step length for safety due to rocker bottom of shoe, pt agreed/somewhat reluctantly.   Repeated gait as above.  Standing therex: Hip abd, flexion, extension, knee flex/extension all w cues for isolation of movement/pace of movement.  15-20 reps each.    Pt educated re importance and purpose of preprosthetic training exercises.  Pt voiced understanding.  Also discussed process of prosthetic fabrication/fitting/training.  wc propulsion 122ft w/supervision/encouragment.  wc to recliner squat pivot transfer using bedrail, cues for safety, supervision.  Discussed safer alternatives and agreed to practice transfers wRW.  Therapy Documentation Precautions:  Precautions Precautions: Fall, Other (comment) Precaution Comments: L BKA  Required Braces or Orthoses: Other Brace Other Brace: darco shoe Restrictions Weight Bearing Restrictions: Yes RLE Weight Bearing: Non weight bearing LLE Weight Bearing: Non weight bearing    Therapy/Group:  Individual Therapy  Rada Hay, PT   Shearon Balo 03/14/2020, 3:58 PM

## 2020-03-14 NOTE — Progress Notes (Signed)
Physical Therapy Session Note  Patient Details  Name: Ronnie Shaw MRN: [id number] Date of Birth: Aug 23, 1980  Today's Date: 03/14/2020 PT Individual Time: 1435-1535 PT Individual Time Calculation (min): 60 min   Short Term Goals: Week 1:  PT Short Term Goal 1 (Week 1): = LTGs due to ELOS  Skilled Therapeutic Interventions/Progress Updates: Pt presented in recliner agreeable to therapy. Pt states pain 8/10 with pain meds received during session. Pt performed squat pivot to w/c with supervision and PTA only bracing w/c to minimize slippage. Pt transported to rehab gym for energy conservation. Pt performed squat pivot to mat in same manner as prior. PTA discussed with pt safety with mobility at home as pt indicates will be mostly sedentary but will most likely use crutches due to smaller entryways (e.g. bathroom). Pt performed STS with RW and CGA (although refused to push from mat thus pulled onto RW). Pt then ambulated x53f with RW and CGA. Pt demonstrated fair safety with RW although somewhat impulsive and demonstrated movement with high velocity. PTA then discussed with pt use of crutches with pt stating feels fairly confident would be able to use crutches. PTA obtained crutches and after some problem solving pt was able to perform STS with crutches and bracing RLE against mat for stability while placing/adjusting crutches. Pt then ambulated with crutches x170fwith CGA although pt noted to push off with high speed/strong force. PTA encouraged pt to take smaller hops for safety with limited receptiveness. PTA discussed pt demonstrated more stability/safety with RW vs crutches. Pt then participated in ball toss with 2Kg weighted ball 2 x 20 for seated dynamic balance and general conditioning. Pt then participated in standing with RW and several bouts of horseshoes for standing tolerance with pt performing with overall supervision. Upon pt attempting to sit back in w/c after standing pt miscalculating  reaching back to chair and near miss into w/c with pt able to recovery without fall. Pt transported back to room at end of session and performed squat pivot transfer back to recliner in same manner as prior. Pt left in chair at end of session with call bell within reach and needs met.      Therapy Documentation Precautions:  Precautions Precautions: Fall, Other (comment) Precaution Comments: L BKA  Required Braces or Orthoses: Other Brace Other Brace: darco shoe Restrictions Weight Bearing Restrictions: Yes RLE Weight Bearing: Non weight bearing LLE Weight Bearing: Non weight bearing General: PT Amount of Missed Time (min): 10 Minutes PT Missed Treatment Reason: Toileting Vital Signs: Therapy Vitals Temp: 98.2 F (36.8 C) Pulse Rate: 98 Resp: 19 BP: (!) 155/83 Patient Position (if appropriate): Sitting Oxygen Therapy SpO2: 99 % O2 Device: Room Air Pain:   Mobility:   Locomotion :    Trunk/Postural Assessment :    Balance:   Exercises:   Other Treatments:      Therapy/Group: Individual Therapy  Sachi Boulay 03/14/2020, 4:08 PM

## 2020-03-14 NOTE — Progress Notes (Signed)
Inpatient Rehabilitation  Patient information reviewed and entered into eRehab system by Katya Rolston M. Denasia Venn, M.A., CCC/SLP, PPS Coordinator.  Information including medical coding, functional ability and quality indicators will be reviewed and updated through discharge.    
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
## 2020-03-15 ENCOUNTER — Inpatient Hospital Stay (HOSPITAL_COMMUNITY): Payer: Self-pay | Admitting: Physical Therapy

## 2020-03-15 ENCOUNTER — Inpatient Hospital Stay (HOSPITAL_COMMUNITY): Payer: Self-pay

## 2020-03-15 ENCOUNTER — Inpatient Hospital Stay (HOSPITAL_COMMUNITY): Payer: Self-pay | Admitting: Occupational Therapy

## 2020-03-15 LAB — GLUCOSE, CAPILLARY
Glucose-Capillary: 129 mg/dL — ABNORMAL HIGH (ref 70–99)
Glucose-Capillary: 128 mg/dL — ABNORMAL HIGH (ref 70–99)
Glucose-Capillary: 121 mg/dL — ABNORMAL HIGH (ref 70–99)
Glucose-Capillary: 133 mg/dL — ABNORMAL HIGH (ref 70–99)

## 2020-03-15 MED ORDER — GABAPENTIN 400 MG PO CAPS
400.0000 mg | ORAL_CAPSULE | Freq: Three times a day (TID) | ORAL | Status: DC
  Administered 2020-03-15 – 2020-03-17 (×6): 400 mg via ORAL
  Filled 2020-03-15 (×6): qty 1

## 2020-03-15 NOTE — Progress Notes (Signed)
[place] PHYSICAL MEDICINE & REHABILITATION PROGRESS NOTE  Subjective/Complaints: Patient seen sitting up in his chair this morning.  He states he slept well overnight.  He wants to know about when he will be discharged home.  He complains of stump pain.  He is noncompliant with safety measures.  ROS: Denies CP, SOB, N/V/D  Objective: Vital Signs: Blood pressure 125/75, pulse (!) 110, temperature 98.2 F (36.8 C), temperature source Oral, resp. rate 19, height 6\' 1"  (1.854 m), weight (!) 152.3 kg, SpO2 98 %. No results found. Recent Labs    03/14/20 0820  WBC 7.8  HGB 7.8*  HCT 25.2*  PLT 392   Recent Labs    03/14/20 0820  NA 136  K 5.0  CL 103  CO2 23  GLUCOSE 141*  BUN 31*  CREATININE 1.75*  CALCIUM 8.9    Intake/Output Summary (Last 24 hours) at 03/15/2020 1314 Last data filed at 03/15/2020 1123 Gross per 24 hour  Intake 1600 ml  Output 3625 ml  Net -2025 ml        Physical Exam: BP 125/75 (BP Location: Left Arm)   Pulse (!) 110   Temp 98.2 F (36.8 C) (Oral)   Resp 19   Ht 6\' 1"  (1.854 m)   Wt (!) 152.3 kg   SpO2 98%   BMI 44.30 kg/m  Constitutional: No distress . Vital signs reviewed. HENT: Normocephalic.  Atraumatic. Eyes: EOMI. No discharge. Cardiovascular: No JVD.  RRR. Respiratory: Normal effort.  No stridor.  Bilateral clear to auscultation. GI: Non-distended.  BS +. Skin: Warm and dry.  Right forefoot with dressing CDI +VAC to left BKA Psych: Flat.  Normal behavior. Musc: Right lower extremity edema with forefoot tenderness Left BKA with edema and tenderness Neuro: Alert Right lower extremity: Hip flexion, knee extension 4+/5, ankle dorsiflexion 4/5 (some pain inhibition), unchanged Left lower extremity: Flexion, knee extension 3+/5 (pain inhibition), unchanged  Assessment/Plan: 1. Functional deficits which require 3+ hours per day of interdisciplinary therapy in a comprehensive inpatient rehab setting.  Physiatrist is providing  close team supervision and 24 hour management of active medical problems listed below.  Physiatrist and rehab team continue to assess barriers to discharge/monitor patient progress toward functional and medical goals   Care Tool:  Bathing    Body parts bathed by patient: Right arm, Left arm, Chest, Abdomen, Front perineal area, Right upper leg, Left upper leg     Body parts n/a: Left lower leg   Bathing assist Assist Level: Supervision/Verbal cueing (refused assist from therapist)     Upper Body Dressing/Undressing Upper body dressing Upper body dressing/undressing activity did not occur (including orthotics): Environmental limitations (IV prohibiting donning shirt)      Upper body assist      Lower Body Dressing/Undressing Lower body dressing      What is the patient wearing?: Pants     Lower body assist Assist for lower body dressing: Minimal Assistance - Patient > 75%     Toileting Toileting    Toileting assist Assist for toileting: Independent (urinal)     Transfers Chair/bed transfer  Transfers assist     Chair/bed transfer assist level: Supervision/Verbal cueing     Locomotion Ambulation   Ambulation assist   Ambulation activity did not occur: Safety/medical concerns (recent revision R transmet amputation RLE, BKA LLE)  Assist level: Contact Guard/Touching assist Assistive device: Parallel bars Max distance: 8   Walk 10 feet activity   Assist  Walk 10 feet activity did  not occur: Safety/medical concerns        Walk 50 feet activity   Assist           Walk 150 feet activity   Assist           Walk 10 feet on uneven surface  activity   Assist           Wheelchair     Assist Will patient use wheelchair at discharge?: Yes Type of Wheelchair: Manual    Wheelchair assist level: Supervision/Verbal cueing Max wheelchair distance: 100    Wheelchair 50 feet with 2 turns activity    Assist        Assist  Level: Supervision/Verbal cueing   Wheelchair 150 feet activity     Assist  Wheelchair 150 feet activity did not occur:  (fatigue)   Assist Level: Moderate Assistance - Patient 50 - 74%    Medical Problem List and Plan: 1.  Deficits with mobility, transfers, endurance, self-care secondary to left BKA.  Continue CIR 2.  Antithrombotics: -DVT/anticoagulation:  Pharmaceutical: Lovenox             -antiplatelet therapy: N/a 3. Pain Management:  Oxycodone 15 mg prn   Gabapentin increased to 400 on 11/30  Monitor with increased exertion, particularly for phantom limb pain. 4. Mood: LCSW to follow for evaluation and support.              -antipsychotic agents: N/A 5. Neuropsych: This patient is capable of making decisions on his own behalf. 6. Right transmet wound/Skin/Wound Care: Monitor wound for healing--resume trental and NTG patch. Dry dressing daily.  Added vitamin C and juven to help promote wound healing.   DC wound VAC tomorrow 7. Fluids/Electrolytes/Nutrition: monitor              Continue to monitor right forefoot wound dehiscence  Continue mild left BKA site 8. HTN: Will continue to monitor BP.   Norvasc ordered.  Labile on 11/30, monitor for trend  Monitor with increased mobility 9. Acute on chronic anemia: IV iron X 2--11/26 and 12/3             Hemoglobin 7.8 on 11/29 10. Acute on chronic renal failure: Baseline SCr- 1.6.  Improving from 3.58-->1.67. Monitor persistent hyperkalemia.              Creatinine 1.75 on 11/29  Encourage fluids 11. Group C strep bacteremia/Wound infection: Leucocytosis resolving.    Completed course of Zosyn on 11/28 per ID  12. 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 Continue to encourage weight loss to help promote mobility and health.  13. T2DM with hyperglycemia: Was non-compliant with Lantus per office notes. Continue CM diet--added low potassium due to persistent hyperkalemia: Will monitor BS ac/hs and use SSI for tighter control.   Mildly  elevated on 11/30  Monitor with increased mobility  14. Malnutrition: Albumin 1.8--continue nutrtional supplement (will change to nephro due to elevated K+) and add juven to promote wound healing.  15.  Leukocytosis: Resolved  WBCs 7.8 on 11/29 16.  Sleep disturbance: lavender drops on forehead at night.   Improving  LOS: 4 days A FACE TO FACE EVALUATION WAS PERFORMED  Ronnie Shaw Ronnie Shaw 03/15/2020, 1:14 PM

## 2020-03-15 NOTE — Progress Notes (Signed)
Patient up in recliner, no acute distress or c/o, denies pain or discomfort, wound vac to suction per orders no new drainage noted, Surgical dressing intact., no drainage. Monitor and assisted

## 2020-03-15 NOTE — Progress Notes (Signed)
Physical Therapy Session Note  Patient Details  Name: Ronnie Shaw MRN: [id number] Date of Birth: 05-28-1980  Today's Date: 03/15/2020 PT Individual Time: 1053-1206 PT Individual Time Calculation (min): 73 min   Short Term Goals: Week 1:  PT Short Term Goal 1 (Week 1): = LTGs due to ELOS  Skilled Therapeutic Interventions/Progress Updates:    Pt received sitting in recliner and agreeable to therapy session. Pt wearing R LE darco shoe and L LE shrinker during session. Pt opens up about his experience with his progressive amputations and expresses that he is really upset that all of this has happened and really doesn't want to feel "handicapped." Therapist provided emotional support and spoke with Kriste Basque, SW regarding recommendation for neuropsych consult or peer-to-peer support from the amputee support group community. Pt expresses great concerns of having proper wound care follow-up upon discharge and therapist spoke with [place], SW to see about setting that up. Pt also reports that he is ready to go home and is "uncomfortable" in the hospital furniture due to his height/size and feels that he is ready for discharge. Therapy session focused on discharge planning and problem solving save mobility in the home. Pt reports that he plans to use crutches to walk to/from the car into/out of the house and that once he is in the house he will sit in his electric recliner chair until he is ready for bed at which that point he will transfer to straddle sitting onto a knee scooter to then ride into the bedroom and transfer to bed. Pt reports that due to his current emotional state after his amputation he refuses to go out into the community or in social settings and therefore will be spending all of his time at his home until he can get a prosthetic. Therapist recommended a wheelchair to allow community mobility and safe household mobility; however, pt adamantly declined stating he felt like having a wheelchair or  RW made him feel handicapped; therefore pt uncompromising in his above plan. R squat pivot recliner>w/c with supervision for safety - pt demos safe technique without cuing.  Transported to/from gym in w/c for time management and energy conservation. Discussed safe sit>stand transfer from w/c to crutches with therapist demonstrating alternate options; however, pt unsafe to stand from w/c as it would slide backwards due to him very strongly pushing the back of his R leg into the chair for balance/stability while placing crutches under his arms. Therefore pt having to perform all sit>stands to crutches from the mat table, which is very heavy/sturdy for him to push the back of his R leg into. L squat pivot w/c>car (small SUV height) with close supervision for safety and pt demoing safe set-up and sequencing of transfer. Gait training ~29ft x2 to/from car<> mat table using crutches with CGA for safety - pt demos very long, reciprocal hopping pattern technique using crutches due to tall height and long step/hop lengths - therapist raised height of crutches on 2nd walk with pt demoing even more improved R LE foot clearance during swing. Therapist visually demonstrated and educated pt on proper sequencing of stepping up/down on/off curb step using crutches. Pt hopped on/off 4" curb step x2 using crutches with CGA/light min assist for balance - on ascent once pt had foot up onto step had to bring 1 crutch up the step at a time leading with L for increased stability (planning to lean onto doorway at home for increased stability while moving crutches). Squat pivot transfers EOM<>w/c with supervision  throughout session. Pt reports feeling confident/comfortable performing mobility tasks addressed during this therapy session. Transported back to room in w/c and pt reports need to use bathroom. L stand pivot w/c>toilet using grab bar mod-I. Pt left sitting on toilet with NT present to assume care of patient.   Therapy  Documentation Precautions:  Precautions Precautions: Fall, Other (comment) Precaution Comments: L BKA  Required Braces or Orthoses: Other Brace Other Brace: darco shoe RLE Restrictions Weight Bearing Restrictions: Yes RLE Weight Bearing: Partial weight bearing RLE Partial Weight Bearing Percentage or Pounds: wearing darco shoe LLE Weight Bearing: Non weight bearing  Pain:   No reports of pain throughout session.   Therapy/Group: Individual Therapy  Ginny Forth , PT, DPT, CSRS  03/15/2020, 8:03 AM

## 2020-03-15 NOTE — Progress Notes (Signed)
Occupational Therapy Session Note  Patient Details  Name: Ronnie Shaw MRN: [id number] Date of Birth: 1981-03-16  Today's Date: 03/15/2020 OT Individual Time: 1345-1430 OT Individual Time Calculation (min): 45 min    Short Term Goals: Week 1:  OT Short Term Goal 1 (Week 1): STG = LTGs due to short ELOS  Skilled Therapeutic Interventions/Progress Updates:    Pt sitting up in recliner, no c/o pain.  Pt reporting he does not feel comfortable washing up in front of therapist and that he feels he does not need therapy for self care because he can already do everything without help just fine.  Pt agreeable to complete transfer training and discuss home layout for dc planning.  Pt completed squat pivot transfer recliner to w/c placing recliner directly in front of w/c, however was able to complete safely with modified independence.  Pt self propelled w/c to bathroom and completed stand pivot w/c to toilet using grab bar with mod I.  Pt reports he does not have grab bar but does have cabinet style sink on right side that he can utilize for balance.  Pt completed sit to stand with mod I and simulated pericare and clothing mgt with mod I.  Pt propelled to room and completed squat pivot back to recliner with mod I.  Discussed pts shower layout, and pt reports he has walk in shower with small threshold and shower doors.  Educated pt on procurement options of shower chair. Pt reports he plans to bathe at sink at home sitting on his knee scooter until cleared to stand in shower because he does not want to purchase shower chair and that a shower chair would make him feel held back.  Pt also reports his brother lives with him and can be home as much as needed to assist with IADLs including cooking, grocery shopping, laundry, and household chores.  Pt called brother during OT session, and brother confirmed with OT that this level of assist is available.  Pt's brother also provided video of pt's room/bathroom layout  to confirm pt does have clear pathways, bathroom layout as pt described, and standard knee scooter.  Pt sitting in recliner at end of session, call bell in reach.  Missed 15 minutes of OT session.    Therapy Documentation Precautions:  Precautions Precautions: Fall, Other (comment) Precaution Comments: L BKA  Required Braces or Orthoses: Other Brace Other Brace: darco shoe RLE Restrictions Weight Bearing Restrictions: Yes RLE Weight Bearing: Partial weight bearing RLE Partial Weight Bearing Percentage or Pounds: wearing darco shoe LLE Weight Bearing: Non weight bearing   Therapy/Group: Individual Therapy  Amie Critchley 03/15/2020, 2:32 PM

## 2020-03-15 NOTE — Progress Notes (Signed)
Physical Therapy Session Note  Patient Details  Name: Ronnie Shaw MRN: [id number] Date of Birth: 1980-10-21  Today's Date: 03/15/2020 PT Individual Time: 1700-1749 PT Individual Time Calculation (min): 60 min   Short Term Goals: Week 1:  PT Short Term Goal 1 (Week 1): = LTGs due to ELOS  Skilled Therapeutic Interventions/Progress Updates:    Pt greeted sitting in recliner, RN present for routine medications, pt without c/o pain. RN requesting assistance for standing weight. Therapist retrieved standing weight scale, pt performed squat pivot transfer with CGA from recliner to w/c, minimal receptiveness for therapist cues for safety awareness and technique. Pt wheeled to standing weight scale and performed sit<>stand with CGA from w/c to RW. He hopped ~3-4 ft onto standing weight scale and therapist provided CGA for safety during measurement. He hopped back with similar fashion with CGA and RW. Pt then reporting need to void. Therefore, wheeled inside bathroom and he performed stand<>pivot transfer with close supervision, use of grab bars in bathroom. Pt requesting privacy during toileting, so door was shut during toileting. Pt continent of bladder and performed stand<>pivot with supervision from toilet back to w/c, use of grab bars. He propelled himself ~175ft + 35ft (rest break) in w/c with supervision. Rest break required due to fatigue. PT retrieved w/c gloves for added grip to assist with propulsion and efficiency, pt was thankful. Performed sit<>stand with CGA from w/c to RW, cues for hand placement, and he ambulated 2x69ft with CGA and RW (seated rest) with hop-to gait pattern, mild dyspnea after completion. Pt reports he will not be ambulating much at home and plans on using a knee scooter for means of mobility. Discussed with him the safety concerns for using this and would recommend w/c for mobility, pt reported "I will not be using a wheelchair when I leave here." He propelled himself with  supervision in his w/c back to his room where he performed squat<>pivot transfer with close supervision from w/c to recliner. He remained seated in recliner with needs in reach.  Therapy Documentation Precautions:  Precautions Precautions: Fall, Other (comment) Precaution Comments: L BKA  Required Braces or Orthoses: Other Brace Other Brace: darco shoe RLE Restrictions Weight Bearing Restrictions: Yes RLE Weight Bearing: Partial weight bearing RLE Partial Weight Bearing Percentage or Pounds: wearing darco shoe LLE Weight Bearing: Non weight bearing  Therapy/Group: Individual Therapy  Marella Vanderpol P Amyrie Illingworth PT 03/15/2020, 9:02 AM

## 2020-03-15 NOTE — Progress Notes (Signed)
Upon entering room patient was returning from BR  in wheelchair. He  stating he is capable of maneuvering himself around without any staff assistance because he understand the logistic of what he is doing and how to do this to get around.Continue to reinforce safety measures and  his limitations set up by his therapist and the IP department to prevent falls or injuries. He replied to Clinical research associate  " I "got this". Patient is monitor and assisted ,remain in wheelchair. call bell with in reach.

## 2020-03-15 NOTE — Progress Notes (Signed)
Patient noted to transfer himself to the recliner without staff assistance, instructed patient to call for staff assistance with all transfer secondary to high risk for fall, patient did not respond, informed assigned nurse Morrie Sheldon, LPN) of this concerns

## 2020-03-15 NOTE — Progress Notes (Signed)
Patient ID: Ronnie Shaw, male   DOB: 18-Jul-1980, 39 y.o.   MRN: [id number] Tried to get charity home health for pt but he would not disclose the household income, therefore he is not eligible for this. He will have no home health at discharge from rehab.

## 2020-03-15 NOTE — Progress Notes (Signed)
Pt continues to self transfer without staff assistance. Pt refuses to be compliant with chair alarm and bed alarm safety measures. Pt educated on safety and importance of call light use. Charge nurse notified.  Mylo Red, LPN

## 2020-03-16 ENCOUNTER — Inpatient Hospital Stay (HOSPITAL_COMMUNITY): Payer: Self-pay | Admitting: Physical Therapy

## 2020-03-16 ENCOUNTER — Encounter (HOSPITAL_COMMUNITY): Payer: Self-pay | Admitting: Psychology

## 2020-03-16 ENCOUNTER — Encounter (HOSPITAL_COMMUNITY): Payer: Self-pay | Admitting: Physical Medicine & Rehabilitation

## 2020-03-16 ENCOUNTER — Inpatient Hospital Stay (HOSPITAL_COMMUNITY): Payer: Self-pay

## 2020-03-16 ENCOUNTER — Other Ambulatory Visit (HOSPITAL_COMMUNITY): Payer: Self-pay | Admitting: Physical Medicine and Rehabilitation

## 2020-03-16 ENCOUNTER — Inpatient Hospital Stay (HOSPITAL_COMMUNITY): Payer: Self-pay | Admitting: Occupational Therapy

## 2020-03-16 DIAGNOSIS — Z91199 Patient's noncompliance with other medical treatment and regimen due to unspecified reason: Secondary | ICD-10-CM

## 2020-03-16 DIAGNOSIS — N183 Chronic kidney disease, stage 3 unspecified: Secondary | ICD-10-CM

## 2020-03-16 DIAGNOSIS — Z794 Long term (current) use of insulin: Secondary | ICD-10-CM

## 2020-03-16 DIAGNOSIS — Z9119 Patient's noncompliance with other medical treatment and regimen: Secondary | ICD-10-CM

## 2020-03-16 DIAGNOSIS — G479 Sleep disorder, unspecified: Secondary | ICD-10-CM

## 2020-03-16 DIAGNOSIS — E1165 Type 2 diabetes mellitus with hyperglycemia: Secondary | ICD-10-CM

## 2020-03-16 DIAGNOSIS — N1831 Chronic kidney disease, stage 3a: Secondary | ICD-10-CM

## 2020-03-16 LAB — GLUCOSE, CAPILLARY
Glucose-Capillary: 152 mg/dL — ABNORMAL HIGH (ref 70–99)
Glucose-Capillary: 153 mg/dL — ABNORMAL HIGH (ref 70–99)
Glucose-Capillary: 172 mg/dL — ABNORMAL HIGH (ref 70–99)
Glucose-Capillary: 105 mg/dL — ABNORMAL HIGH (ref 70–99)
Glucose-Capillary: 157 mg/dL — ABNORMAL HIGH (ref 70–99)

## 2020-03-16 MED ORDER — AMLODIPINE BESYLATE 10 MG PO TABS: 10.0000 mg | ORAL_TABLET | Freq: Every day | ORAL | 0 refills | Status: DC

## 2020-03-16 MED ORDER — GABAPENTIN 400 MG PO CAPS: 400.0000 mg | ORAL_CAPSULE | Freq: Three times a day (TID) | ORAL | 0 refills | Status: DC

## 2020-03-16 MED ORDER — SENNOSIDES-DOCUSATE SODIUM 8.6-50 MG PO TABS: 1.0000 | ORAL_TABLET | Freq: Every evening | ORAL | 0 refills | Status: DC | PRN

## 2020-03-16 MED ORDER — OXYCODONE HCL 5 MG PO TABS
5.0000 mg | ORAL_TABLET | Freq: Four times a day (QID) | ORAL | Status: DC | PRN
  Administered 2020-03-16 – 2020-03-17 (×2): 10 mg via ORAL
  Filled 2020-03-16 (×3): qty 2

## 2020-03-16 MED ORDER — GLIPIZIDE 5 MG PO TABS
2.5000 mg | ORAL_TABLET | Freq: Every day | ORAL | Status: DC
  Administered 2020-03-16 – 2020-03-17 (×2): 2.5 mg via ORAL
  Filled 2020-03-16 (×2): qty 1

## 2020-03-16 MED ORDER — CYCLOBENZAPRINE HCL 5 MG PO TABS: 5.0000 mg | ORAL_TABLET | Freq: Three times a day (TID) | ORAL | 0 refills | Status: DC | PRN

## 2020-03-16 MED ORDER — OXYCODONE HCL 10 MG PO TABS
5.0000 mg | ORAL_TABLET | Freq: Two times a day (BID) | ORAL | 0 refills | Status: DC | PRN
Start: 2020-03-16 — End: 2020-03-18

## 2020-03-16 MED ORDER — HYDRALAZINE HCL 10 MG PO TABS: 10.0000 mg | ORAL_TABLET | Freq: Three times a day (TID) | ORAL | 0 refills | Status: DC

## 2020-03-16 MED ORDER — PANTOPRAZOLE SODIUM 40 MG PO TBEC: 40.0000 mg | DELAYED_RELEASE_TABLET | Freq: Every day | ORAL | 0 refills | Status: DC

## 2020-03-16 MED ORDER — HYDRALAZINE HCL 10 MG PO TABS
10.0000 mg | ORAL_TABLET | Freq: Three times a day (TID) | ORAL | Status: DC
  Administered 2020-03-16 – 2020-03-17 (×3): 10 mg via ORAL
  Filled 2020-03-16 (×3): qty 1

## 2020-03-16 MED ORDER — PENTOXIFYLLINE ER 400 MG PO TBCR: 400.0000 mg | EXTENDED_RELEASE_TABLET | Freq: Three times a day (TID) | ORAL | 0 refills | Status: DC

## 2020-03-16 MED ORDER — GLIPIZIDE 5 MG PO TABS: 2.5000 mg | ORAL_TABLET | Freq: Every day | ORAL | 0 refills | Status: DC

## 2020-03-16 MED ORDER — SODIUM CHLORIDE 0.9 % IV SOLN
510.0000 mg | INTRAVENOUS | Status: AC
  Administered 2020-03-16: 510 mg via INTRAVENOUS
  Filled 2020-03-16: qty 17
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
## 2020-03-16 NOTE — Progress Notes (Signed)
Patient ID: Ronnie Shaw, male   DOB: Oct 06, 1980, 39 y.o.   MRN: [id number]  Met with pt to discuss team conference goals mod/i and discharge 12/2. His brother will be in at 1:00 to go through therapy with pt. Pt plans to be more compliant with his diabetes. Have scheduled an appointment for [facility] 12/31 @ 9:30 am. He needs to follow up with them he has not seen them for over a year. He has all needed equipment for home. Follow until tomorrow.

## 2020-03-16 NOTE — Progress Notes (Signed)
Physical Therapy Session Note  Patient Details  Name: Ronnie Shaw MRN: [id number] Date of Birth: 1980/08/12  Today's Date: 03/16/2020 PT Individual Time: 0900-0955 PT Individual Time Calculation (min): 55 min   Short Term Goals: Week 1:  PT Short Term Goal 1 (Week 1): = LTGs due to ELOS   Skilled Therapeutic Interventions/Progress Updates:   Pt received in recliner and agreeable to PT. Pt performs squat pivot transfer to [facility] with supervision. Pt transported to main gym total assist for time management and energy conservation. Squat pivot to mat table supervision and sit>supine supervision.   Therex: -supine all 2x15 (bil modified bridge w/ bolster, hamstring heel slide on R, bil SLR) -prone 2x15 (hip extension) -rest breaks provided as necessary  Gait: -pt ambulated ~69f with bil crutches and close supervision. Prior to ambulating, PT asked pt to slow his pace slightly when ambulating, however pt declines and states "I will not fall". During ambulation, one near LOB noted to L due to PT guarding too closely causing crutch interference (due to pt speed and swing distance with crutches).   Curb Training: -pt ambulating with PT supervision to curb step and while adjusting R foot, L crutch slipped and hit the step, resulting in pt rotating to his L and was assisted by therapist to floor, landing on his R side. Pt did not hit his head or either residual limb during fall. No injury noted by therapist and pt reports no pain or injury.   Pt able to perform floor transfer to chair with set up assist and supervision safely. Pt continues to state that his crutch slipped and that he is okay. Pt performed squat [facility] and was transported back to room total assist. Pt performed squat pivot to recliner with supervision and left with call bell in reach and needs met. Pt notified of protocol to report fall to his physician and nursing staff.   Nursing staff and physician  both made aware of incident.   Therapy Documentation Precautions:  Precautions Precautions: Fall, Other (comment) Precaution Comments: L BKA  Required Braces or Orthoses: Other Brace Other Brace: darco shoe RLE Restrictions Weight Bearing Restrictions: Yes RLE Weight Bearing: Partial weight bearing RLE Partial Weight Bearing Percentage or Pounds: wearing darco shoe LLE Weight Bearing: Non weight bearing Vital Signs: Therapy Vitals Temp: 98.2 F (36.8 C) Temp Source: Oral Pulse Rate: 100 BP: (!) 145/94 Oxygen Therapy SpO2: 100 % Pain: Pain Assessment Pain Scale: 0-10 Pain Score: 0-No pain    Therapy/Group: Individual Therapy  BGaylord Shih SPT 03/16/2020, 10:28 AM

## 2020-03-16 NOTE — Discharge Summary (Signed)
Physical Therapy Discharge Summary  Patient Details  Name: Ronnie Shaw MRN: [id number] Date of Birth: 1981/02/03  Today's Date: 03/16/2020 PT Individual Time: 1300-1415 PT Individual Time Calculation (min): 75 min    Patient has met 10 of 13 long term goals due to improved activity tolerance, improved balance, increased strength, ability to compensate for deficits and improved awareness.  Patient to discharge at an ambulatory level Supervision.   Patient's care partner is independent to provide the necessary physical assistance at discharge. During his rehabilitation, he showed limited receptiveness to therapist and staff interventions (as documented). Therapy staff recommended techniques for safety but pt reporting understanding his "own way" of doing things. Despite education, he refused a w/c at discharge and reports he will just use his knee scooter for means of mobility.  Reasons goals not met: Pt unable to perform 1 step with supervision, required min/modA for balance while using RW. He also required CGA for gait due to safety concerns. Family training was performed with brother for hands-on training prior to DC.  Recommendation:  Patient will benefit from ongoing skilled PT services in outpatient setting to continue to advance safe functional mobility, address ongoing impairments in residual limb care, balance deficits, general deconditioning, in order to minimize fall risk.  Equipment: No equipment provided. Pt uninsured but he has a RW at home that he reported he would be using. He also reports he would use a knee scooter for mobility because a w/c does not fit inside of his house.  Reasons for discharge: treatment goals met and discharge from hospital  Patient/family agrees with progress made and goals achieved: Yes  PT Discharge Precautions/Restrictions Restrictions Weight Bearing Restrictions: Yes RLE Weight Bearing: Partial weight bearing Vital Signs Therapy  Vitals Temp: 98.2 F (36.8 C) Temp Source: Oral Pulse Rate: 98 Resp: 17 BP: (!) 145/94 Oxygen Therapy SpO2: 100 % Pain Pain Assessment Pain Scale: 0-10 Pain Score: 0-No pain Vision/Perception  Perception Perception: Within Functional Limits Praxis Praxis: Intact  Cognition Overall Cognitive Status: Within Functional Limits for tasks assessed Arousal/Alertness: Awake/alert Orientation Level: Oriented X4 Attention: Selective Selective Attention: Appears intact Memory: Appears intact Problem Solving: Appears intact Behaviors: Impulsive Safety/Judgment: Impaired Sensation Sensation Light Touch: Appears Intact Hot/Cold: Appears Intact Proprioception: Appears Intact Stereognosis: Not tested Coordination Gross Motor Movements are Fluid and Coordinated: No Fine Motor Movements are Fluid and Coordinated: Yes Motor  Motor Motor: Within Functional Limits  Mobility Bed Mobility Bed Mobility: Not assessed (Pt reports no difficulty with getting into and out of the bed. Reports he can complete without staff assist) Transfers Transfers: Sit to Stand;Stand Pivot Transfers;Stand to Sit Sit to Stand: Supervision/Verbal cueing Stand to Sit: Supervision/Verbal cueing Stand Pivot Transfers: Contact Guard/Touching assist Stand Pivot Transfer Details: Verbal cues for technique;Verbal cues for safe use of DME/AE;Verbal cues for precautions/safety Lateral/Scoot Transfers: Independent (squat pivot) Transfer (Assistive device): Rolling walker Locomotion  Gait Ambulation: Yes Gait Assistance: Contact Guard/Touching assist Gait Distance (Feet): 20 Feet Assistive device: Rolling walker Gait Gait: Yes Gait Pattern: Impaired Gait Pattern:  (hop-to gait on RLE with Darco shoe on RLE. Decreased R hop/step length, decreased R foot clearance. Heavy reliance on BUE's through RW) Stairs / Additional Locomotion Stairs: Yes Stairs Assistance: Minimal Assistance - Patient > 75% Stair Management  Technique: Backwards;With walker Number of Stairs: 1 Height of Stairs: 6 Curb: Minimal Assistance - Patient >75% Wheelchair Mobility Wheelchair Mobility: Yes Wheelchair Assistance: Dependent - Patient 0% Wheelchair Propulsion: Both upper extremities;Right lower extremity Wheelchair Parts Management: Supervision/cueing Distance:  297f  Trunk/Postural Assessment  Cervical Assessment Cervical Assessment: Within Functional Limits Thoracic Assessment Thoracic Assessment: Within Functional Limits Lumbar Assessment Lumbar Assessment: Within Functional Limits Postural Control Postural Control: Within Functional Limits  Balance Balance Balance Assessed: Yes Static Sitting Balance Static Sitting - Level of Assistance: 7: Independent Dynamic Sitting Balance Dynamic Sitting - Level of Assistance: 5: Stand by assistance Sitting balance - Comments: supervision Static Standing Balance Static Standing - Level of Assistance: 5: Stand by assistance Dynamic Standing Balance Dynamic Standing - Level of Assistance: Other (comment) (CGA) Extremity Assessment  RUE Assessment RUE Assessment: Within Functional Limits LUE Assessment LUE Assessment: Within Functional Limits RLE Assessment RLE Assessment: Exceptions to [facility] Strength Comments: deferred ankle DF due to recent surgery. Grossly 4+/5 LLE Assessment LLE Assessment: Exceptions to [facility]Ad[facility]ellasGeneral Strength Comments: L BKA. hip flex/ext grossly 4-/5  Skilled Intervention: Pt received seated in recliner, brother at bedside, pt agreeable to PT session with no reports of pain. Focus of session on family training in preparation for safe DC home. Pt is very eager and voiced readiness for DC home where his brother will be providing 24/7 assistance when needed. Printed and provided patient with individualized HEP and also provided him with positioning guidelines for residual limb. Discussed with both him and his brother home safety precautions,  energy conservation techniques, limb loss education, and using RW (NOT crutches for ALL mobility. All questions and concerns addressed, pt was appreciative. Pt performed squat<>pivot transfer mod I from recliner to w/c. He propelled himself mod I in w/c >2076fon level surfaces using BUE's, distance limited by shldr fatigue. He performed car transfers with supervision via squat<>pivot technique and car height was simulated to his [place]. Focused remainder of session on curb/1-step training as he has 1 + 1 steps to enter his home. Provided him with RW and therapist provided demonstration of hopping backwards onto 4inch platform to mimic home entrance. Pt was able to complete this on his 1st effort with modA but on the other x2 efforts he had difficulty completing this due to both his Darco boot (missing toe box) and his ability to produce effective clearance. He required modA for balance for completing this and his brother was present also for education and hands on training. Pt reporting he could complete this with crutches better, therefore, provided him with crutches and he was able to hop up with minA but unable to maintain balance to bring crutches up with him. On final attempt, provided him with LA[place]here he was able to successfully compete x4 reps of hopping up curb backwards with RW support and minA, again having the brother present for hands on training. After completion, both brother and patient verbalized safety and feel ready for DC home with education/intervention provided. Pt was returned to his room where he performed squat<>pivot mod I from w/c to recliner. He remained seated in recliner with brother at bedside, needs in reach, all questions and concerns addressed.   Access Code: [email or web address] URL: [email or web address] Date: 03/16/2020 Prepared by: ChGinnie SmartExercises Supine Quad Set (BKA) - 1 x daily - 7 x weekly - 15 reps - 3 sets Supine Gluteal Sets - 1  x daily - 7 x weekly - 15 reps - 3 sets Supine Active Straight Leg Raise - 1 x daily - 7 x weekly - 15 reps - 3 sets Supine Isometric Hip Adduction with Towel Roll (AKA) - 1 x daily - 7 x weekly - 15 reps - 3  sets Supine Single Leg Bridge with Sound Leg (BKA) - 1 x daily - 7 x weekly - 15 reps - 3 sets Sidelying Hip Abduction (AKA) - 1 x daily - 7 x weekly - 15 reps - 3 sets Prone Knee Flexion - 1 x daily - 7 x weekly - 15 reps - 3 sets Prone Hip Extension with Residual Limb (BKA) - 1 x daily - 7 x weekly - 15 reps - 3 sets Seated Knee Extension (BKA) - 1 x daily - 7 x weekly - 15 reps - 3 sets Chair Push-Up (AKA) - 1 x daily - 7 x weekly - 15 reps - 3 sets  [employer] PT, DPT 03/16/2020, 11:36 AM

## 2020-03-16 NOTE — Progress Notes (Signed)
Alert and oriented x4, patient appears in no acute distress and denies pain,,up in wheelchair in room ,wound vac intact to surgical left BKA. Patient remain non complaint will safety measure and wearing safety equipment,, reinforced safety precautionary measures and education regaining this,states he understand

## 2020-03-16 NOTE — Progress Notes (Addendum)
[facility] PHYSICAL MEDICINE & REHABILITATION PROGRESS NOTE  Subjective/Complaints: Patient seen sitting up in his chair this morning.  He states he slept well overnight.  He wants to know when he can go home.  Later, informed by therapies regarding assisted fall-no trauma noted.  ROS: Denies CP, SOB, N/V/D  Objective: Vital Signs: Blood pressure (!) 145/94, pulse 100, temperature 98.2 F (36.8 C), temperature source Oral, resp. rate 16, height 6\' 1"  (1.854 m), weight (!) 152.3 kg, SpO2 100 %. No results found. Recent Labs    03/14/20 0820  WBC 7.8  HGB 7.8*  HCT 25.2*  PLT 392   Recent Labs    03/14/20 0820  NA 136  K 5.0  CL 103  CO2 23  GLUCOSE 141*  BUN 31*  CREATININE 1.75*  CALCIUM 8.9    Intake/Output Summary (Last 24 hours) at 03/16/2020 1035 Last data filed at 03/16/2020 0900 Gross per 24 hour  Intake 360 ml  Output 2025 ml  Net -1665 ml        Physical Exam: BP (!) 145/94   Pulse 100   Temp 98.2 F (36.8 C) (Oral)   Resp 16   Ht 6\' 1"  (1.854 m)   Wt (!) 152.3 kg   SpO2 100%   BMI 44.30 kg/m  Constitutional: No distress . Vital signs reviewed. HENT: Normocephalic.  Atraumatic. Eyes: EOMI. No discharge. Cardiovascular: No JVD.  RRR. Respiratory: Normal effort.  No stridor.  Bilateral clear to auscultation. GI: Non-distended.  BS +. Skin: Warm and dry.  Right forefoot with medial and lateral dehiscence +VAC to left BKA Psych: Flat.  Normal behavior. Musc: Right lower extremity edema with forefoot tenderness Left BKA with edema and tenderness Neuro: Alert Right lower extremity: Hip flexion, knee extension 4+/5, ankle dorsiflexion 4/5 (some pain inhibition), stable Left lower extremity: Flexion, knee extension 3+/5 (pain inhibition), unchanged  Assessment/Plan: 1. Functional deficits which require 3+ hours per day of interdisciplinary therapy in a comprehensive inpatient rehab setting.  Physiatrist is providing close team supervision and 24  hour management of active medical problems listed below.  Physiatrist and rehab team continue to assess barriers to discharge/monitor patient progress toward functional and medical goals   Care Tool:  Bathing    Body parts bathed by patient: Right arm, Left arm, Chest, Abdomen, Front perineal area, Right upper leg, Left upper leg     Body parts n/a: Left lower leg   Bathing assist Assist Level: Supervision/Verbal cueing (refused assist from therapist)     Upper Body Dressing/Undressing Upper body dressing Upper body dressing/undressing activity did not occur (including orthotics): Environmental limitations (IV prohibiting donning shirt)      Upper body assist      Lower Body Dressing/Undressing Lower body dressing      What is the patient wearing?: Pants     Lower body assist Assist for lower body dressing: Minimal Assistance - Patient > 75%     Toileting Toileting    Toileting assist Assist for toileting: Supervision/Verbal cueing     Transfers Chair/bed transfer  Transfers assist     Chair/bed transfer assist level: Supervision/Verbal cueing (squat pivot)     Locomotion Ambulation   Ambulation assist   Ambulation activity did not occur: Safety/medical concerns (recent revision R transmet amputation RLE, BKA LLE)  Assist level: Contact Guard/Touching assist Assistive device: Crutches Max distance: 57ft   Walk 10 feet activity   Assist  Walk 10 feet activity did not occur: Safety/medical concerns  Assist  level: Contact Guard/Touching assist Assistive device: Crutches   Walk 50 feet activity   Assist           Walk 150 feet activity   Assist           Walk 10 feet on uneven surface  activity   Assist           Wheelchair     Assist Will patient use wheelchair at discharge?: No (pt adamantly declines w/c) Type of Wheelchair: Manual    Wheelchair assist level: Supervision/Verbal cueing Max wheelchair distance: 100     Wheelchair 50 feet with 2 turns activity    Assist        Assist Level: Supervision/Verbal cueing   Wheelchair 150 feet activity     Assist  Wheelchair 150 feet activity did not occur:  (fatigue)   Assist Level: Moderate Assistance - Patient 50 - 74%    Medical Problem List and Plan: 1.  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  Continue CIR  Team conference today to discuss current and goals and coordination of care, home and environmental barriers, and discharge planning with nursing, case manager, and therapies. Please see conference note from today as well.  2.  Antithrombotics: -DVT/anticoagulation:  Pharmaceutical: Lovenox             -antiplatelet therapy: N/a 3. Pain Management:  Oxycodone 15 mg prn   Gabapentin increased to 400 on 11/30  Controlled with meds on 12/1  Monitor with increased exertion, particularly for phantom limb pain. 4. Mood: LCSW to follow for evaluation and support.              -antipsychotic agents: N/A 5. Neuropsych: This patient is capable of making decisions on his own behalf. 6. Right transmet wound/Skin/Wound Care: Monitor wound for healing--resume trental and NTG patch. Dry dressing daily.  Added vitamin C and juven to help promote wound healing.   DC wound VAC 7. Fluids/Electrolytes/Nutrition: monitor              Continue to monitor right forefoot wound dehiscence 8. HTN: Will continue to monitor BP.   Norvasc ordered.  Hydralazine 10 3 times daily started on 12/1  Monitor with increased mobility 9. Acute on chronic anemia: IV iron X 2--11/26 and 12/3             Hemoglobin 7.8 on 11/29 10. Acute on chronic kidney injury IIIa: Baseline SCr- 1.6.  Improving from 3.58-->1.67. Monitor persistent hyperkalemia.              Creatinine 1.75 on 11/29  Encourage fluids 11. Group C strep bacteremia/Wound infection: Leucocytosis resolving.    Completed course of  Zosyn on 11/28 per ID  12. 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 Continue to encourage weight loss to help promote mobility and health.  13. T2DM with hyperglycemia: Was non-compliant with Lantus per office notes. Continue CM diet--added low potassium due to persistent hyperkalemia: Will monitor BS ac/hs and use SSI for tighter control.   Noncompliant with diet recommendations  Glipizide 2.5 started on 12/1  Monitor with increased mobility  14. Malnutrition: Albumin 1.8--continue nutrtional supplement (will change to nephro due to elevated K+) and add juven to promote wound healing.  15.  Leukocytosis: Resolved  WBCs 7.8 on 11/29 16.  Sleep disturbance: lavender drops on forehead at night.   Improving  LOS: 5 days A FACE TO FACE EVALUATION WAS PERFORMED  Milanna Kozlov [name] 03/16/2020, 10:35  AM

## 2020-03-16 NOTE — Discharge Summary (Addendum)
Physician Discharge Summary  Patient ID: Ronnie Shaw MRN: [id number] DOB/AGE: 06-13-1980 39 y.o.  Admit date: 03/11/2020 Discharge date: 03/17/2020  Discharge Diagnoses:  Principal Problem:   Below-knee amputation of left lower extremity (HCC) Active Problems:   AKI (acute kidney injury) (HCC)   Essential hypertension   Wound dehiscence--chronic   Sleep disturbance   Noncompliance   Uncontrolled type 2 diabetes mellitus with hyperglycemia (HCC)   Stage 3 chronic kidney disease (HCC)   Iron deficiency anemia   Discharged Condition: Stable   Significant Diagnostic Studies:   Labs:  Basic Metabolic Panel: Recent Labs  Lab 03/10/20 0132 03/11/20 0127 03/12/20 0503 03/14/20 0820  NA 136 135 136 136  K 5.0 5.2* 5.0 5.0  CL 106 103 104 103  CO2 22 22 24 23   GLUCOSE 145* 134* 138* 141*  BUN 34* 32* 35* 31*  CREATININE 1.55* 1.67* 1.82* 1.75*  CALCIUM 8.5* 8.6* 8.7* 8.9    CBC: Recent Labs  Lab 03/11/20 0127 03/12/20 0503 03/14/20 0820  WBC 11.0* 10.7* 7.8  NEUTROABS  --  7.4  --   HGB 7.6* 7.4* 7.8*  HCT 24.9* 24.2* 25.2*  MCV 90.9 93.4 91.3  PLT 430* 433* 392    CBG: Recent Labs  Lab 03/15/20 1631 03/15/20 2117 03/16/20 0552 03/16/20 1035 03/16/20 1138  GLUCAP 121* 133* 152* 153* 172*    Vitals with BMI 03/17/2020 03/16/2020 03/16/2020  Height - - -  Weight - - -  BMI - - -  Systolic 133 142 14/04/2019  Diastolic 81 86 83  Pulse 92 97 102    Brief HPI:   Ronnie Shaw is a 39 y.o. 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  He was admitted on 02/28/2020 with fevers leukocytosis as well as acute on chronic renal failure due to severe sepsis.  He was found to have foot abscess with necrotizing fasciitis and was started on broad based antibiotics and eventually required left BKA on 03/09/2020 by Dr. 03/11/2020.  Wound  cultures positive for Pseudomonas and actinomyces and BC were positive for Strep Sanguis.  ID recommended narrowing antibiotics to IV Zosyn with end date 11/28 to complete 2 weeks antibiotics course.  Leukocytosis was resolving with treatment of infection and he received a course of IV iron for acute blood loss anemia. He is to continue PWB RLE and NWB L-BKA.  Therapy was initiated and patient was limited by pain as well as weakness.  CIR was recommended due to functional decline.   Hospital Course: Ronnie Shaw was admitted to rehab 03/11/2020 for inpatient therapies to consist of PT and OT at least three hours five days a week. Past admission physiatrist, therapy team and rehab RN have worked together to provide customized collaborative inpatient rehab. His blood pressures were monitored on TID basis and was noted to be labile. He was started on Norvasc which was titrated upwards and low dose hydralazine for better controlled. Acute on chronic renal failure has been monitored and recommend follow up BMET in a couple of weeks.  He refused Carb modified/low K diet and family has been providing food from 03/13/2020. His diabetes has been monitored with ac/hs CBG checks and SSI was use prn for tighter BS control. Low dose glucotrol was added prior to discharge and recommend further titration after discharge. He reports having glucometer and was advised to monitor BS ac/hs till  follow up with PCP.   He completed his antibiotic course on 11/28 without SE. L-BKA wound VAC was removed on 12/1 and wound is healing well. Right foot incision with chronic dehiscence is C/D. Trental and NTG patch was resumed to help improve circulation and promote healing. Serial CBC showed H/H is stable and reactive leucocytosis has resolved. He received 2nd dose faraheme on 12/2 for supplementation. His progress in rehab has been limited by variable participation in therapy with inflexibility to learn new techniques and  interventions to promote safety as well as requests to decrease length of stay.  He declined giving HH household income to determine charity care therefore was denied any services. He has been educated on HEP and has been set up with [facility] for primary care follow up.    Rehab course: During patient's stay in rehab weekly team conferences were held to monitor patient's progress, set goals and discuss barriers to discharge. At admission, patient required min assit with ADL task and CGA to min assist with mobility.  He has had improvement in activity tolerance, balance, postural control as well as ability to compensate for deficits. He requires set up with supervision to complete ADL tasks. He requires supervision to [facility] for transfers and to ambulate 20' with RW. He is able to climb one step with min assist and use of crutches. Family education has been completed with brother.    Disposition: Home  Diet: Carb Modified  Special Instructions: 1. No weight on left BKA. PWB on Right foot with Darco shoe. 2. Recommend recheck BMET and CBC in a couple of weeks.    Allergies as of 03/17/2020       Reactions   Prednisone Other (See Comments)   "makes me go blind", "that's how I got cataracts".        Medication List     STOP taking these medications    ChlorOxygen 50 MG/18DROPS Conc   oxyCODONE-acetaminophen 10-325 MG tablet Commonly known as: PERCOCET   silver sulfADIAZINE 1 % cream Commonly known as: Silvadene       TAKE these medications    amLODipine 10 MG tablet Commonly known as: NORVASC Take 1 tablet (10 mg total) by mouth daily.   cyclobenzaprine 5 MG tablet Commonly known as: FLEXERIL Take 1 tablet (5 mg total) by mouth 3 (three) times daily as needed for muscle spasms.   gabapentin 400 MG capsule Commonly known as: NEURONTIN Take 1 capsule (400 mg total) by mouth 3 (three) times daily.   glipiZIDE 5 MG tablet Commonly known as:  GLUCOTROL Take 0.5 tablets (2.5 mg total) by mouth daily before breakfast.   hydrALAZINE 10 MG tablet Commonly known as: APRESOLINE Take 1 tablet (10 mg total) by mouth every 8 (eight) hours.   nitroGLYCERIN 0.2 mg/hr patch Commonly known as: NITRODUR - Dosed in mg/24 hr Place 1 patch (0.2 mg total) onto the skin daily. What changed: additional instructions   Oxycodone HCl 10 MG Tabs--Rx # 10 pills  Take 0.5-1 tablets (5-10 mg total) by mouth 2 (two) times daily as needed for severe pain. Notes to patient: Limit to one pill per day   pantoprazole 40 MG tablet Commonly known as: PROTONIX Take 1 tablet (40 mg total) by mouth daily.   pentoxifylline 400 MG CR tablet Commonly known as: TRENTAL Take 1 tablet (400 mg total) by mouth 3 (three) times daily with meals. What changed: when to take this Notes to patient: To improve circulation  senna-docusate 8.6-50 MG tablet Commonly known as: Senokot-S Take 1 tablet by mouth at bedtime as needed for mild constipation.   TRUEplus Lancets 28G Misc 1 each by Does not apply route 3 (three) times daily.   VITAMIN C PO Take 1 tablet by mouth daily.   VITAMIN D3 PO Take 1 tablet by mouth 2 (two) times daily.   ZINC PO Take 1 tablet by mouth 2 (two) times daily.        Follow-up Information     Claiborne Rigg, NP Follow up on 04/15/2020.   Specialty: Nurse Practitioner Why: Appointment @ 9:30 AM--this will be your primary care provider. Do not cancel this appointment Contact information: [address] [place] [address] [phone]         Marcello Fennel, MD Follow up.   Specialty: Physical Medicine and Rehabilitation Why: Office will call you with follow up appointment Contact information: [address] [place] [address] [phone]         [name], MD. Call in 1 day(s).   Specialty: Orthopedic Surgery Why: for post op appointment Contact information: [address] [place] [address] [phone]                 Signed: Jacquelynn Cree 03/17/2020, 6:33 PM Patient was seen, face-face, and physical exam performed by me on day of discharge, greater than 30 minutes of total time spent.. Please see progress note from day of discharge as well.  Maryla Morrow, MD, ABPMR

## 2020-03-16 NOTE — Patient Care Conference (Signed)
Inpatient RehabilitationTeam Conference and Plan of Care Update Date: 03/16/2020   Time:11:27 AM     Patient Name: Ronnie Shaw      Medical Record Number: [id number]  Date of Birth: [birth year] Sex: Male         Room/Bed: 4W15C/4W15C-01 Payor Info: Payor: /    Admit Date/Time:  03/11/2020  3:28 PM  Primary Diagnosis:  Subacute osteomyelitis, left ankle and foot [facility])  Hospital Problems: Principal Problem:   Subacute osteomyelitis, left ankle and foot (HCC) Active Problems:   Below-knee amputation of left lower extremity (HCC)   Bacteremia   AKI (acute kidney injury) (HCC)   Essential hypertension   Labile blood glucose   Wound dehiscence   Sleep disturbance   Noncompliance   Uncontrolled type 2 diabetes mellitus with hyperglycemia (HCC)   Stage 3 chronic kidney disease (HCC)   Controlled type 2 diabetes mellitus with hyperglycemia, with long-term current use of insulin [facility])    Expected Discharge Date: Expected Discharge Date: 03/17/20  Team Members Present: Physician leading conference: Dr. Maryla Morrow Care Coodinator Present: Chana Bode, RN, BSN, CRRN;Becky Dupree, LCSW Nurse Present: Adam Phenix, LPN PT Present: Wynelle Link, PT OT Present: Dolphus Jenny, OT PPS Coordinator present : Fae Pippin, [place], PT     Current Status/Progress Goal Weekly Team Focus  Bowel/Bladder   Patient is continent of B/B, LBM 11/30.21  Maintain continence and regular patterns  QS/PRN assess   Swallow/Nutrition/ Hydration             ADL's   CGA functional transfers using RW and mod I for toileting and donning right Darco shoe; UTA all other self care due to pt refusing to perform with therapy  mod I      Mobility             Communication             Safety/Cognition/ Behavioral Observations            Pain   Patiwnt denies need for pain medication  pain < 4/10  Assess pain QS/PRN   Skin   Surgical dressin right foot towa amputation and Left BKA  with wound Vac intact     QS/PRN  assessment     Discharge Planning:  HOme to brother's home will be alone some of the time. He plans on doing what he wants. have tried to get him charity home health but he will not disclose the household income-therefore will not get any home health   Team Discussion: Non compliant with diet and resistant to recommendations from therapy. BP and CBGs running high; MD to adjust medications. Pain medication also adjusted, order to removed wound vac. Patient with assisted fall with crutches. Patient is resistant to assistive device recommendations for walker; prefers knee walker over walker however staff note this method is unsafe given impulsivity and and safety awareness issues. Patient on target to meet rehab goals: yes, supervision for squat pivot transfers; mod I wheelchair level  *See Care Plan and progress notes for long and short-term goals.   Revisions to Treatment Plan:  Practice with various assistive devices Teaching Needs: Transfers, toileting, skin care, medications, etc.  Current Barriers to Discharge: Decreased caregiver support, Behavior and insurance for follow up services  Possible Resolutions to Barriers: Home exercise program Family education with brother     Medical Summary Current Status: Deficits with mobility, transfers, endurance, self-care secondary to left BKA.  Barriers to Discharge: Behavior;Medical stability;Other (comments);Weight bearing restrictions;Weight;Wound  care  Barriers to Discharge Comments: Noncompliance Possible Resolutions to Barriers/Weekly Focus: Therapies, optimize BP/DM meds, follow labs - Cr, d/c VAC, optimize pain meds   Continued Need for Acute Rehabilitation Level of Care: The patient requires daily medical management by a physician with specialized training in physical medicine and rehabilitation for the following reasons: Direction of a multidisciplinary physical rehabilitation program to maximize  functional independence : Yes Medical management of patient stability for increased activity during participation in an intensive rehabilitation regime.: Yes Analysis of laboratory values and/or radiology reports with any subsequent need for medication adjustment and/or medical intervention. : Yes   I attest that I was present, lead the team conference, and concur with the assessment and plan of the team.   Chana Bode B 03/16/2020, 3:17 PM

## 2020-03-16 NOTE — Progress Notes (Signed)
Occupational Therapy Session Note  Patient Details  Name: Ronnie Shaw MRN: [id number] Date of Birth: 1980/08/14  Today's Date: 03/16/2020 OT Individual Time: 0700-0809 OT Individual Time Calculation (min): 69 min    Short Term Goals: Week 1:  OT Short Term Goal 1 (Week 1): STG = LTGs due to short ELOS  Skilled Therapeutic Interventions/Progress Updates:    1:1 Pt received in recliner agreeable to OT. Pt disconnects wound vac for entirety of session despite education to allow it to remain in place. Pt completes alltransfers with set up for w/c parts management and gentle education on improved set up of wc todecrease space of open air and floor in between chair and surface reclienr>w/c>EOM> w/c> toilet>w/c> recliner. Pt completes 4x10 ball toss with 4#wrist weights on BUE for strengthening and core strengthening required for functional mobility and transfers with foam wedge set behind pt (chest, bounce, overhead and overhead toss with modified sit up to wedge). Pt educated on painmanagement/ desensitizations/ mirror therapy benefits during rest breaks. Pt semi receptive to education but states, "yall say this is in my mind but that hurts me because it makes me think im crippled." Educated that this is a common experience of all people with amputations. Pt verbalized understanding. Pt lies in prone for 10 min after education on benefits to prevent hip flexor shortening with exercises (hip extension, knee extension and ab/adduction). Pt states, "yeah I can tell the difference." Returned to room with pt transferring onto toilet with distant S and direct handoff to LPN at end of session.   Therapy Documentation Precautions:  Precautions Precautions: Fall, Other (comment) Precaution Comments: L BKA  Required Braces or Orthoses: Other Brace Other Brace: darco shoe RLE Restrictions Weight Bearing Restrictions: Yes RLE Weight Bearing: Partial weight bearing RLE Partial Weight Bearing Percentage or  Pounds: wearing darco shoe LLE Weight Bearing: Non weight bearing General:   Vital Signs: Therapy Vitals Temp: 98 F (36.7 C) Pulse Rate: 99 Resp: 16 BP: (!) 146/79 Patient Position (if appropriate): Sitting Oxygen Therapy SpO2: 100 % O2 Device: Room Air Pain:   ADL: ADL Upper Body Bathing: Setup Where Assessed-Upper Body Bathing: Edge of bed Lower Body Bathing: Setup Where Assessed-Lower Body Bathing: Edge of bed Lower Body Dressing: Minimal assistance Where Assessed-Lower Body Dressing: Edge of bed Toilet Transfer: Contact guard Toilet Transfer Method: Other (comment) (lateral scoot) Toilet Transfer Equipment: Extra wide drop arm bedside commode Vision   Perception    Praxis   Exercises:   Other Treatments:     Therapy/Group: Individual Therapy  [name] 03/16/2020, 7:02 AM

## 2020-03-16 NOTE — Progress Notes (Signed)
Occupational Therapy Discharge Summary  Patient Details  Name: Ronnie Shaw MRN: [id number] Date of Birth: 1980/08/23  Today's Date: 03/16/2020 OT Individual Time: 1130-1200 OT Individual Time Calculation (min): 30 min    Patient has met 5 of 9 long term goals due to ability to compensate for deficits and improved awareness.  Patient to discharge at overall setup to Supervision level.  Patient's care partner is independent to provide the necessary physical and cognitive assistance at discharge.    Reasons goals not met: Pt with fair to poor participation and compliance to skilled OT recommendations and training despite therapeutic efforts.    Recommendation:  Patient unable to receive further skilled OT per financial reasons.    Equipment: No equipment provided; Pt has RW at home.  Reasons for discharge: treatment goals met and discharge from hospital  Patient/family agrees with progress made and goals achieved: Yes   Skilled Intervention: Pt sitting up in recliner, no c/o pain.  OT discussed with and educated pt regarding safety hazards of using crutches or knee scooter for functional transfers at home per pt reporting during yesterdays OT session, his desire to use when dc to home setting. OT educated pt on improved safety using RW for functional mobility instead. Pt reporting he knows he has been resistant to using RW previously, but more receptive to the idea now.  Pt donned right [employer] shoe with mod I.  Pt completed sit to stand at [facility] with CGA from recliner and ambulated to bathroom with CGA and completed toilet transfer using grab bar with supervision.  Pt completed toileting with CGA at RW.  Pt ambulated back to recliner using RW and CGA.  Educated pt on safe technique in home setting for bathing at EOB with setup of ADL supplies including basin from brother or girlfriend. Pt reports good understanding and agreeable.  Pt concerned about receiving iron supplement before dc, nurse made  aware.  Call bell in reach.    OT Discharge Precautions/Restrictions  Precautions Precautions: Fall;Other (comment) Precaution Comments: Ronnie BKA  Required Braces or Orthoses: Other Brace Other Brace: darco shoe RLE Restrictions Weight Bearing Restrictions: Yes RLE Weight Bearing: Partial weight bearing LLE Weight Bearing: Non weight bearing Pain Pain Assessment Pain Scale: 0-10 Pain Score: 0-No pain ADL ADL Upper Body Bathing: Setup Where Assessed-Upper Body Bathing: Edge of bed Lower Body Bathing: Setup Where Assessed-Lower Body Bathing: Edge of bed Upper Body Dressing: Setup Where Assessed-Upper Body Dressing: Edge of bed Lower Body Dressing: Contact guard Where Assessed-Lower Body Dressing: Edge of bed Toileting: Contact guard Where Assessed-Toileting: Glass blower/designer: Therapist, music Method: Counselling psychologist: Grab bars Vision Baseline Vision/History: No visual deficits Patient Visual Report: No change from baseline Vision Assessment?: No apparent visual deficits Perception  Perception: Within Functional Limits Praxis Praxis: Intact Cognition Overall Cognitive Status: Within Functional Limits for tasks assessed Arousal/Alertness: Awake/alert Orientation Level: Oriented X4 Selective Attention: Appears intact Memory: Appears intact Behaviors: Impulsive Safety/Judgment: Impaired Sensation Sensation Light Touch: Appears Intact Hot/Cold: Appears Intact Proprioception: Appears Intact Stereognosis: Not tested Coordination Gross Motor Movements are Fluid and Coordinated: No Fine Motor Movements are Fluid and Coordinated: Yes Motor  Motor Motor: Within Functional Limits Mobility  Bed Mobility Bed Mobility: Not assessed Transfers Sit to Stand: Contact Guard/Touching assist Stand to Sit: Contact Guard/Touching assist  Trunk/Postural Assessment  Cervical Assessment Cervical Assessment: Within Functional Limits Thoracic  Assessment Thoracic Assessment: Within Functional Limits Lumbar Assessment Lumbar Assessment: Within Functional Limits Postural Control Postural Control: Within  Functional Limits  Balance Balance Balance Assessed: Yes Static Sitting Balance Static Sitting - Level of Assistance: 7: Independent Dynamic Sitting Balance Dynamic Sitting - Level of Assistance: 5: Stand by assistance Static Standing Balance Static Standing - Level of Assistance: 5: Stand by assistance Dynamic Standing Balance Dynamic Standing - Level of Assistance: Other (comment) (CGA) Extremity/Trunk Assessment RUE Assessment RUE Assessment: Within Functional Limits LUE Assessment LUE Assessment: Within Functional Limits   Ronnie Shaw Ronnie Shaw 03/16/2020, 4:00 PM

## 2020-03-16 NOTE — Progress Notes (Signed)
   03/16/20 1000  What Happened  Was fall witnessed? Yes  Who witnessed fall? [place], PT  Patients activity before fall ambulating-assisted  Was patient injured? No  Follow Up  MD notified Allena Katz, MD Marissa Nestle, PA  Time MD notified 1000  Family notified Yes - comment  Time family notified 1000  Additional tests No  Simple treatment Other (comment)  Progress note created (see row info) Yes  Adult Fall Risk Assessment  Risk Factor Category (scoring not indicated) High fall risk per protocol (document High fall risk)  Age 39  Fall History: Fall within 6 months prior to admission 0  Elimination; Bowel and/or Urine Incontinence 0  Elimination; Bowel and/or Urine Urgency/Frequency 0  Medications: includes PCA/Opiates, Anti-convulsants, Anti-hypertensives, Diuretics, Hypnotics, Laxatives, Sedatives, and Psychotropics 5  Patient Care Equipment 1  Mobility-Assistance 2  Mobility-Gait 2  Mobility-Sensory Deficit 0  Altered awareness of immediate physical environment 0  Impulsiveness 2  Lack of understanding of one's physical/cognitive limitations 4  Total Score 16  Patient Fall Risk Level High fall risk  Adult Fall Risk Interventions  Required Bundle Interventions *See Row Information* High fall risk - low, moderate, and high requirements implemented  Additional Interventions Lap belt while in chair/wheelchair  Screening for Fall Injury Risk (To be completed on HIGH fall risk patients) - Assessing Need for Low Bed  Risk For Fall Injury- Low Bed Criteria None identified - Continue screening  Screening for Fall Injury Risk (To be completed on HIGH fall risk patients who do not meet crieteria for Low Bed) - Assessing Need for Floor Mats Only  Risk For Fall Injury- Criteria for Floor Mats None identified - No additional interventions needed  Will Implement Floor Mats Yes  Neurological  Neuro (WDL) X  Level of Consciousness Alert  Orientation Level Oriented X4  Cognition Appropriate at  baseline  Speech Clear  R Hand Grip Moderate  L Hand Grip Moderate   R Foot Dorsiflexion Moderate  L Foot Dorsiflexion Other (Comment) (BKA)  Musculoskeletal  Musculoskeletal (WDL) X  Assistive Device Wheelchair  Generalized Weakness Yes  Integumentary  Integumentary (WDL) X  Skin Color Appropriate for ethnicity  Skin Condition Dry  Skin Integrity Surgical Incision (see LDA)  Amputation Location Foot;Leg  Amputation Location Orientation Left;Right  Amputation Intervention Other (Comment) (assessed)  Skin Turgor Non-tenting

## 2020-03-17 ENCOUNTER — Inpatient Hospital Stay (HOSPITAL_COMMUNITY): Payer: Self-pay

## 2020-03-17 ENCOUNTER — Inpatient Hospital Stay (HOSPITAL_COMMUNITY): Payer: Self-pay | Admitting: Occupational Therapy

## 2020-03-17 DIAGNOSIS — D509 Iron deficiency anemia, unspecified: Secondary | ICD-10-CM

## 2020-03-17 LAB — GLUCOSE, CAPILLARY: Glucose-Capillary: 152 mg/dL — ABNORMAL HIGH (ref 70–99)

## 2020-03-17 MED FILL — CYCLOBENZAPRINE HCL 5 MG TA: 5 | 10 days supply | Qty: 30 | Fill #0

## 2020-03-17 MED FILL — hydrALAZINE HCL 10 MG TABS: 10 | 30 days supply | Qty: 90 | Fill #0

## 2020-03-17 MED FILL — PENTOXIFYLLINE 400 MG TAB S: 400 | 30 days supply | Qty: 90 | Fill #0

## 2020-03-17 MED FILL — glipiZIDE 5 MG TABS: 5 | 30 days supply | Qty: 15 | Fill #0

## 2020-03-17 MED FILL — AMLODIPINE BESYLATE 10 MG T: 10 | 30 days supply | Qty: 30 | Fill #0

## 2020-03-17 MED FILL — SENEXON-S 8.6-50 MG TABS: 8.6-50 | 30 days supply | Qty: 30 | Fill #0

## 2020-03-17 MED FILL — GABAPENTIN 400 MG CAPSULE: 400 | 30 days supply | Qty: 90 | Fill #0

## 2020-03-17 MED FILL — PANTOPRAZOLE SOD DR 40 MG T: 40 | 30 days supply | Qty: 30 | Fill #0

## 2020-03-17 MED FILL — oxyCODONE HCL 10 MG TABS: 10 | 5 days supply | Qty: 10 | Fill #0

## 2020-03-17 NOTE — Progress Notes (Signed)
Patient anticipates going home today, state I"m ready to go home,.Medicated x1 with prn Oxycodone and Flexeril this shift. Patient has slept in the recliner the entire shift.states the bed is to uncomfortable to sleep in. Call bell in place,monitor and assisted

## 2020-03-17 NOTE — Progress Notes (Addendum)
[place] PHYSICAL MEDICINE & REHABILITATION PROGRESS NOTE  Subjective/Complaints: Patient seen sitting up in his chair this morning.  He states he did not sleep well overnight.  He is ready for discharge.  He has questions regarding follow-up appointments.  Discussed importance of safety and adherence to therapy recommendations regarding mobilities to prevent another fall.  ROS: Denies CP, SOB, N/V/D  Objective: Vital Signs: Blood pressure 133/81, pulse 92, temperature 97.9 F (36.6 C), temperature source Oral, resp. rate 15, height 6\' 1"  (1.854 m), weight (!) 152.3 kg, SpO2 99 %. No results found. No results for input(s): WBC, HGB, HCT, PLT in the last 72 hours. No results for input(s): NA, K, CL, CO2, GLUCOSE, BUN, CREATININE, CALCIUM in the last 72 hours.  Intake/Output Summary (Last 24 hours) at 03/17/2020 0902 Last data filed at 03/17/2020 0348 Gross per 24 hour  Intake 1876 ml  Output 700 ml  Net 1176 ml        Physical Exam: BP 133/81 (BP Location: Left Arm)   Pulse 92   Temp 97.9 F (36.6 C) (Oral)   Resp 15   Ht 6\' 1"  (1.854 m)   Wt (!) 152.3 kg   SpO2 99%   BMI 44.30 kg/m  Constitutional: No distress . Vital signs reviewed. HENT: Normocephalic.  Atraumatic. Eyes: EOMI. No discharge. Cardiovascular: No JVD.  RRR. Respiratory: Normal effort.  No stridor.  Bilateral clear to auscultation. GI: Non-distended.  BS +. Skin: Warm and dry.  Right forefoot with dressing CDI Left BKA with medial and lateral mild serosanguineous drainage Psych: Flat.  Disengaged.   Musc: Right lower extremity with edema, improving and tenderness Left BKA with edema and tenderness Neuro: Alert Right lower extremity: Hip flexion, knee extension 4+/5, ankle dorsiflexion 4/5 (some pain inhibition), unchanged Left lower extremity: Flexion, knee extension 3+/5 (pain inhibition), improving  Assessment/Plan: 1. Functional deficits which require 3+ hours per day of interdisciplinary therapy  in a comprehensive inpatient rehab setting.  Physiatrist is providing close team supervision and 24 hour management of active medical problems listed below.  Physiatrist and rehab team continue to assess barriers to discharge/monitor patient progress toward functional and medical goals   Care Tool:  Bathing    Body parts bathed by patient: Right arm, Left arm, Chest, Abdomen, Front perineal area, Right upper leg, Left upper leg     Body parts n/a: Left lower leg   Bathing assist Assist Level: Supervision/Verbal cueing     Upper Body Dressing/Undressing Upper body dressing Upper body dressing/undressing activity did not occur (including orthotics): Environmental limitations (IV prohibiting donning shirt) What is the patient wearing?: Pull over shirt    Upper body assist Assist Level: Set up assist    Lower Body Dressing/Undressing Lower body dressing      What is the patient wearing?: Pants     Lower body assist Assist for lower body dressing: Contact Guard/Touching assist     Toileting Toileting    Toileting assist Assist for toileting: Supervision/Verbal cueing     Transfers Chair/bed transfer  Transfers assist     Chair/bed transfer assist level: Independent with assistive device (squat pivot)     Locomotion Ambulation   Ambulation assist   Ambulation activity did not occur: Safety/medical concerns (recent revision R transmet amputation RLE, BKA LLE)  Assist level: Contact Guard/Touching assist Assistive device: Walker-rolling Max distance: 17ft   Walk 10 feet activity   Assist  Walk 10 feet activity did not occur: Safety/medical concerns  Assist level: Contact  Guard/Touching assist Assistive device: Walker-rolling   Walk 50 feet activity   Assist Walk 50 feet with 2 turns activity did not occur: Safety/medical concerns         Walk 150 feet activity   Assist Walk 150 feet activity did not occur: Safety/medical concerns          Walk 10 feet on uneven surface  activity   Assist Walk 10 feet on uneven surfaces activity did not occur: Safety/medical concerns         Wheelchair     Assist Will patient use wheelchair at discharge?: No (pt refuses w/c) Type of Wheelchair: Manual    Wheelchair assist level: Independent Max wheelchair distance: 200    Wheelchair 50 feet with 2 turns activity    Assist        Assist Level: Independent   Wheelchair 150 feet activity     Assist  Wheelchair 150 feet activity did not occur:  (fatigue)   Assist Level: Independent    Medical Problem List and Plan: 1.  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  DC today  Will see patient for transitional care management in 1-2 weeks post-discharge  Patient adamant he would like to go home and noncompliant with safety recommendations and precautions. 2.  Antithrombotics: -DVT/anticoagulation:  Pharmaceutical: Lovenox             -antiplatelet therapy: N/a 3. Pain Management:  Oxycodone 15 mg prn   Gabapentin increased to 400 on 11/30  Controlled with meds on 12/2  Monitor with increased exertion, particularly for phantom limb pain. 4. Mood: LCSW to follow for evaluation and support.              -antipsychotic agents: N/A 5. Neuropsych: This patient is capable of making decisions on his own behalf.  Seen by neuropsych-appreciate recs, discussed with neuropsych patient behavior 6. Right transmet wound/Skin/Wound Care: Monitor wound for healing--resume trental and NTG patch. Dry dressing daily.  Added vitamin C and juven to help promote wound healing.   DCed wound VAC 7. Fluids/Electrolytes/Nutrition: monitor  8. HTN: Will continue to monitor BP.   Norvasc ordered.  Hydralazine 10 3 times daily started on 12/1  Improving on 12/2, continue to monitor ambulatory setting  Monitor with increased mobility 9. Acute on chronic anemia: IV iron X  2--11/26 and 12/3             Hemoglobin 7.8 on 11/29 10. Acute on chronic kidney injury IIIa: Baseline SCr- 1.6.  Improving from 3.58-->1.67. Monitor persistent hyperkalemia.              Creatinine 1.75 on 11/29  Encourage fluids 11. Group C strep bacteremia/Wound infection: Leucocytosis resolving.    Completed course of Zosyn on 11/28 per ID  12. 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 Continue to encourage weight loss to help promote mobility and health.  13. T2DM with hyperglycemia: Was non-compliant with Lantus per office notes. Continue CM diet--added low potassium due to persistent hyperkalemia: Will monitor BS ac/hs and use SSI for tighter control.   Noncompliant with diet recommendations  Glipizide 2.5 started on 12/1  Slightly labile on 12/2, continue to monitor ambulatory setting.  Continue to encourage diet compliance  Monitor with increased mobility  14. Malnutrition: Albumin 1.8--continue nutrtional supplement (will change to nephro due to elevated K+) and add juven to promote wound healing.  15.  Leukocytosis: Resolved  WBCs 7.8 on 11/29 16.  Sleep disturbance: lavender  drops on forehead at night.   Improving 17.  Skin  Continue to monitor right forefoot for wound healing  Continue to monitor BKA for healing  > 30 minutes spent in total in discharge planning between myself and PA regarding aforementioned, as well discussion regarding DME equipment, follow-up appointments, follow-up therapies, discharge medications, discharge recommendations, safety awareness and compliance  LOS: 6 days A FACE TO FACE EVALUATION WAS PERFORMED  Ronnie Shaw 03/17/2020, 9:02 AM

## 2020-03-17 NOTE — Plan of Care (Signed)
  Problem: Consults Goal: RH LIMB LOSS PATIENT EDUCATION Description: Description: See Patient Education module for eduction specifics. Outcome: Completed/Met Goal: Skin Care Protocol Initiated - if Braden Score 18 or less Description: If consults are not indicated, leave blank or document N/A Outcome: Completed/Met Goal: Nutrition Consult-if indicated Outcome: Completed/Met Goal: Diabetes Guidelines if Diabetic/Glucose > 140 Description: If diabetic or lab glucose is > 140 mg/dl - Initiate Diabetes/Hyperglycemia Guidelines & Document Interventions  Outcome: Completed/Met   Problem: RH BOWEL ELIMINATION Goal: RH STG MANAGE BOWEL WITH ASSISTANCE Description: STG Manage Bowel with moderate Assistance. Outcome: Completed/Met   Problem: RH BLADDER ELIMINATION Goal: RH STG MANAGE BLADDER WITH ASSISTANCE Description: STG Manage Bladder With moderate Assistance Outcome: Completed/Met   Problem: RH SKIN INTEGRITY Goal: RH STG SKIN FREE OF INFECTION/BREAKDOWN Description: Skin free from infection entire stay on rehab Outcome: Completed/Met Goal: RH STG ABLE TO PERFORM INCISION/WOUND CARE W/ASSISTANCE Description: STG Able To Perform Incision/Wound Care With moderate Assistance. Outcome: Completed/Met   Problem: RH SAFETY Goal: RH STG ADHERE TO SAFETY PRECAUTIONS W/ASSISTANCE/DEVICE Description: STG Adhere to Safety Precautions With moderate assistance Assistance/Device. Outcome: Completed/Met   Problem: RH PAIN MANAGEMENT Goal: RH STG PAIN MANAGED AT OR BELOW PT'S PAIN GOAL Description: Pain below 2 Outcome: Completed/Met   Problem: RH KNOWLEDGE DEFICIT LIMB LOSS Goal: RH STG INCREASE KNOWLEDGE OF SELF CARE AFTER LIMB LOSS Description: Moderate assistance re: increase knowledge of self care after limb loss Outcome: Completed/Met

## 2020-03-17 NOTE — Progress Notes (Signed)
Inpatient Rehabilitation Care Coordinator  Discharge Note  The overall goal for the admission was met for:   Discharge location: Yes-HOME WITH BROTHER WHO CAN ASSIST WITH HIS CARE  Length of Stay: Yes-6 DAYS  Discharge activity level: Yes-SUPERVISION LEVEL  Home/community participation: Yes  Services provided included: MD, RD, PT, OT, RN, CM, Pharmacy, Neuropsych and SW  Financial Services: Other: PENDING MEDICAID  Follow-up services arranged: Other: HOME EXERCISE PROGRAM-PT DECLINED GIVING HOME HEALTH AGENCY HOUSEHOLD INCOME TO SEE IF ELIGIBLE FOR CHARITY CARE. HAS ALL NEEDED EQUIPMENT FROM PREVIOUS SURGERIES. MATCH GIVEN FOR ASSISTANCE WITH MEDICATIONS  Comments (or additional information):BROTHER CAME IN FOR EDUCATION YESTERDAY AND FEEL COMFORTABLE WITH PT'S CARE NEEDS. PT TENDS TO DO WHAT HE WANTS EVEN IF UNSAFE AND HIGH RISK TO FALL. HOPEFULLY HE WILL FOLLOW UP WITH PCP APPT MADE FOR 12/31 @ 9:30. HE HAS BEEN NON-COMPLIANT IN THE PAST. HE IS AWARE HE WILL BE AT HIGH RISK TO LOSE HIS OTHER LEG IF NON-COMPLIANT WITH DIABETES AND WOUND CARE  Patient/Family verbalized understanding of follow-up arrangements: Yes  Individual responsible for coordination of the follow-up plan: SELF [phone]   Confirmed correct DME delivered: Elease Hashimoto 03/17/2020    Elease Hashimoto

## 2020-03-17 NOTE — Discharge Instructions (Signed)
Inpatient Rehab Discharge Instructions  Ronnie Shaw Discharge date and time: 03/17/20    Activities/Precautions/ Functional Status: Activity: no lifting, driving, or strenuous exercise for till cleared by MD Diet: diabetic diet Wound Care: Wash with antibacterial soap and water. Pat dry and apply stump shrinker on the wound. Place dry dressing on shrinker if you develop drainage and change dressing 1-2 times a day if soiled. Rinse out shrinker if soiled and change daily.   Functional status:  ___ No restrictions     ___ Walk up steps independently _X__ 24/7 supervision/assistance   ___ Walk up steps with assistance ___ Intermittent supervision/assistance  ___ Bathe/dress independently ___ Walk with walker     _X__ Bathe/dress with assistance ___ Walk Independently    ___ Shower independently ___ Walk with assistance    ___ Shower with assistance _X__ No alcohol     ___ Return to work/school ________  Special Instructions: 1. Do not put any weight on left leg/below knee amputation site --no knee walker.  2. Wear off loading shoe on right foot for pressure relief.   COMMUNITY REFERRALS UPON DISCHARGE:   HOME EXERCISE PROGRAM HAS ALL EQUIPMENT FROM PREVIOUS SURGERIES MATCH FOR PRESCRIPTION ASSITANCE  GENERAL COMMUNITY RESOURCES FOR PATIENT/FAMILY: Support Groups:[facility] SUPPORT GROUP THE SECOND Thursday OF EACH MONTH @ 7:00-8:30 PM LOCATED AT [facility] CONTACT Hillery Hunter [phone]   My questions have been answered and I understand these instructions. I will adhere to these goals and the provided educational materials after my discharge from the hospital.  Patient/Caregiver Signature _______________________________ Date __________  Clinician Signature _______________________________________ Date __________  Please bring this form and your medication list with you to all your follow-up doctor's appointments.

## 2020-03-17 NOTE — Progress Notes (Signed)
Patient discharged home with brother. All belongings sent home with patient. Patient has walker at home. Pharmacy has delivered medications to patient and are in patients belongings. Escorted out by tech via wheelchair

## 2020-03-18 ENCOUNTER — Telehealth: Payer: Self-pay

## 2020-03-18 ENCOUNTER — Other Ambulatory Visit: Payer: Self-pay

## 2020-03-18 ENCOUNTER — Encounter: Payer: Self-pay | Admitting: Physician Assistant

## 2020-03-18 ENCOUNTER — Ambulatory Visit (INDEPENDENT_AMBULATORY_CARE_PROVIDER_SITE_OTHER): Payer: Self-pay | Admitting: Physician Assistant

## 2020-03-18 ENCOUNTER — Inpatient Hospital Stay: Payer: Self-pay | Admitting: Physician Assistant

## 2020-03-18 VITALS — Ht 73.0 in | Wt 335.0 lb

## 2020-03-18 DIAGNOSIS — M86172 Other acute osteomyelitis, left ankle and foot: Secondary | ICD-10-CM

## 2020-03-18 DIAGNOSIS — Z89512 Acquired absence of left leg below knee: Secondary | ICD-10-CM

## 2020-03-18 MED ORDER — NITROGLYCERIN 0.2 MG/HR TD PT24: 0.2000 mg | MEDICATED_PATCH | Freq: Every day | TRANSDERMAL | 12 refills | Status: DC

## 2020-03-18 MED ORDER — OXYCODONE HCL 10 MG PO TABS
5.0000 mg | ORAL_TABLET | Freq: Two times a day (BID) | ORAL | 0 refills | Status: DC | PRN
Start: 2020-03-18 — End: 2020-03-21

## 2020-03-18 NOTE — Telephone Encounter (Signed)
Appt offered for today at 10:30 and pt's mother states she is not able to work out transportation. Appt made for Monday. Also message to [employer] to see if they can bring limb protector here to the office. If not then the pt can go to [employer] to pick up after his appt on Monday. Will hold this message pending advisement from [employer]
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
## 2020-03-18 NOTE — Telephone Encounter (Signed)
Thayer Ohm will come Monday with limb protector.

## 2020-03-18 NOTE — Telephone Encounter (Signed)
Patients mom called she stated he had surgery and she stated Dr.Duda said he would provide a protector for amputee she stated patient didn't receive one. [phone]

## 2020-03-21 ENCOUNTER — Other Ambulatory Visit: Payer: Self-pay | Admitting: Physician Assistant

## 2020-03-21 ENCOUNTER — Inpatient Hospital Stay: Payer: Self-pay | Admitting: Orthopedic Surgery

## 2020-03-21 ENCOUNTER — Telehealth: Payer: Self-pay | Admitting: Physician Assistant

## 2020-03-21 MED ORDER — OXYCODONE HCL 10 MG PO TABS
5.0000 mg | ORAL_TABLET | Freq: Two times a day (BID) | ORAL | 0 refills | Status: DC | PRN
Start: 2020-03-21 — End: 2020-03-30

## 2020-03-21 MED ORDER — NITROGLYCERIN 0.2 MG/HR TD PT24: 0.2000 mg | MEDICATED_PATCH | Freq: Every day | TRANSDERMAL | 12 refills | Status: AC

## 2020-03-21 MED ORDER — NITROGLYCERIN 0.2 MG/HR TD PT24: 0.2000 mg | MEDICATED_PATCH | Freq: Every day | TRANSDERMAL | 12 refills | Status: DC

## 2020-03-21 MED ORDER — OXYCODONE HCL 10 MG PO TABS
5.0000 mg | ORAL_TABLET | Freq: Two times a day (BID) | ORAL | 0 refills | Status: DC | PRN
Start: 2020-03-21 — End: 2020-03-21

## 2020-03-21 NOTE — Telephone Encounter (Signed)
Received call from patient's mother Aviva Kluver stating the NitroGlycerin patches were not called into the pharmacy yet. Steward Drone asked for a call back when the patches are called in. The number to contact Steward Drone is [phone]   The number to contact patient is [phone]

## 2020-03-21 NOTE — Telephone Encounter (Signed)
Per [place], she has spoken with patient and this has been taken care of.

## 2020-03-23 NOTE — Discharge Summary (Signed)
Physician Discharge Summary  Ronnie Shaw ZOX:[id number] DOB: Jul 25, 1980 DOA: 02/28/2020  PCP: Claiborne Rigg, NP  Admit date: 02/28/2020 Discharge date: 03/11/2020  Time spent: 35 minutes  Recommendations for Outpatient Follow-up:  1. Orthopedics Dr. Lajoyce Corners in 1 week 2. 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 Group C strep bacteremia   Hyponatremia   Diabetic polyneuropathy associated with type 2 diabetes mellitus (HCC)   Acute kidney failure (HCC)   Diabetic ulcer of left foot (HCC)   Infection of left foot   Necrotizing fasciitis (HCC)   Acute osteomyelitis of left foot (HCC)   Unilateral complete BKA, left, subsequent encounter (HCC)   Amputation of right forefoot (HCC)   Acute on chronic anemia   Postoperative pain   Discharge Condition: Improved  Diet recommendation: Diabetic  Filed Weights   03/06/20 2110 03/09/20 0931 03/10/20 2100  Weight: (!) 168.2 kg (!) 168.2 kg (!) 168.2 kg    History of present illness:  39 year old with past medical history significant for obesity, type 2 diabetes, diabetic foot ulcer with right foot transmetatarsal amputation in 2020 presents to the ED with fevers, chills, nausea, vomiting and cough and worsening pain in his left foot. -Admitted with sepsis, necrotic diabetic foot ulcer and acute kidney injury  Hospital Course:   Sepsis, Gangrenous left foot with osteomyelitis fifth and fourth metatarsal with necrotizing fasciitis and abscess. Group C strep bacteremia -Blood cultures grew group C strep, OR cultures were polymicrobial with pansensitive Pseudomonas and repeat culture with actinomyces, strep anginosis and parabacteroides -Initially was on daptomycin and Zosyn, then on Zosyn alone -ABIs were normal -Underwent left foot fourth and fifth ray  amputation with extensive excisional debridement of skin soft tissue and fascia on 11/17 followed by repeat debridement on 11/19 -Dr. Lajoyce Corners following closely he was concerned that the patient has less viable soft tissue on the left foot with no interval healing after previous debridements and hence recommended transtibial amputation, this was completed 11/24 -Per ID continue antibiotics until 11/28 -Discharged to CIR for rehabilitation, close follow-up with Dr. Lajoyce Corners recommended  AKI: Creatinine baseline 1.6 from July, creatinine on admission was 3.5 in the setting of ATN from sepsis -Creatinine back to baseline now, monitor  Type 2 diabetes mellitus Obesity BMI 44, weight loss was encouraged  A1c 6.4 -CBGs are stable  Hyponatremia;  Resolved.   Acute blood loss anemia Iron deficiency anemia -Hemoglobin has ranged from the 8-9 range -Transfused 2 units of PRBC this admission, anemia panel noted iron deficiency, given IV iron as well  -Add oral iron at discharge  HTN- -started on Norvasc   Discharge Exam: Vitals:   03/11/20 0458 03/11/20 1027  BP: (!) 141/90 (!) 150/74  Pulse: 100 98  Resp: 18 19  Temp: 98.5 F (36.9 C) 99.4 F (37.4 C)  SpO2: 100% 100%    General: AAOx3 Cardiovascular: S1-S2, regular rate rhythm Respiratory: Clear  Discharge Instructions    Allergies as of 03/11/2020      Reactions   Prednisone Other (See Comments)   "makes me go blind", "that's how I got cataracts".      Medication List    ASK your doctor about these medications   TRUEplus Lancets 28G Misc 1 each by Does not apply route 3 (three) times daily.   VITAMIN C PO Take 1 tablet by mouth daily.  VITAMIN D3 PO Take 1 tablet by mouth 2 (two) times daily.   ZINC PO Take 1 tablet by mouth 2 (two) times daily.      Allergies  Allergen Reactions  . Prednisone Other (See Comments)    "makes me go blind", "that's how I got cataracts".    Follow-up Information     [name], MD In 1 week.   Specialty: Orthopedic Surgery Contact information: [address] [place] [address] [phone]                The results of significant diagnostics from this hospitalization (including imaging, microbiology, ancillary and laboratory) are listed below for reference.    Significant Diagnostic Studies: DG Abd 1 View  Result Date: 03/02/2020 CLINICAL DATA:  Short of breath when lying down as well as abdominal pain today. EXAM: ABDOMEN - 1 VIEW COMPARISON:  None. FINDINGS: There is no bowel dilation to suggest obstruction. Soft tissues and skeletal structures are unremarkable. IMPRESSION: Negative. Electronically Signed   By: Amie Portland M.D.   On: 03/02/2020 11:37   DG CHEST PORT 1 VIEW  Result Date: 03/02/2020 CLINICAL DATA:  Short of breath when lying down as well as abdominal pain today. EXAM: PORTABLE CHEST 1 VIEW COMPARISON:  02/28/2020 and earlier exams. FINDINGS: Cardiac silhouette normal in size.  No mediastinal or hilar masses. Clear lungs.  No convincing pleural effusion and no pneumothorax. Skeletal structures are grossly intact. IMPRESSION: No acute cardiopulmonary disease. Electronically Signed   By: Amie Portland M.D.   On: 03/02/2020 11:36   DG Chest Port 1 View  Result Date: 02/28/2020 CLINICAL DATA:  Fever and shortness of breath EXAM: PORTABLE CHEST 1 VIEW COMPARISON:  January 02, 2018 FINDINGS: There is ill-defined airspace opacity in the medial right base. The lungs elsewhere are clear. Heart is upper normal in size with pulmonary vascularity normal. No adenopathy. No bone lesions. IMPRESSION: Ill-defined opacity medial right base concerning for focus of pneumonia. Lungs elsewhere clear. Heart upper normal in size. No adenopathy. Correlation with COVID-19 status advised given the findings in the right base. Electronically Signed   By: Bretta Bang III M.D.   On: 02/28/2020 12:22   DG Foot Complete Left  Result Date:  03/02/2020 CLINICAL DATA:  Open wound left fifth metatarsal EXAM: LEFT FOOT - COMPLETE 3+ VIEW COMPARISON:  02/28/2020 FINDINGS: Loss of cortical definition of the fifth metatarsal head with focal osteopenia. The base of the fifth toe proximal phalanx also demonstrates relative demineralization. There is a fracture through the base of the fifth toe proximal phalanx. No dislocation. Extensive soft tissue swelling and ulceration is again noted overlying the fifth MTP joint. Soft tissue gas is seen throughout the fifth toe tracking proximally to the level of the mid fifth metatarsal diaphysis. IMPRESSION: 1. Acute osteomyelitis of the fifth metatarsal head and base of the fifth toe proximal phalanx. 2. Fracture through the base of the fifth toe proximal phalanx. 3. Soft tissue gas throughout the fifth toe tracking proximally to the level of the mid fifth metatarsal diaphysis which may be in part related to patient's open wound, although findings remain concerning for gas-forming infection. No appreciable progression in the degree of soft tissue gas compared to the previous study. Electronically Signed   By: Duanne Guess D.O.   On: 03/02/2020 08:30   DG Foot Complete Left  Result Date: 02/28/2020 CLINICAL DATA:  Open wound on LEFT fifth metatarsal EXAM: LEFT FOOT - COMPLETE 3+ VIEW COMPARISON:  October 25, 2015. FINDINGS: No acute fracture or dislocation. There is an ulcer in the lateral aspect adjacent to the fifth MTP. No definitive adjacent cortical erosion. There is soft tissue air extending into the fourth and fifth digit and proximally to the level of the fourth mid metatarsal shaft. Curvilinear radiopaque density seen on oblique images likely reflecting overlying bandage. IMPRESSION: 1. Soft tissue air extending from lateral foot ulcer into the fourth and fifth digits and proximally to the level of the fourth mid metatarsal shaft. This may be due to the open wound. Please note that necrotizing fasciitis is  a clinical diagnosis. 2. No definitive adjacent cortical erosion to suggest osteomyelitis. If persistent concern, consider dedicated MRI. Electronically Signed   By: Meda Klinefelter MD   On: 02/28/2020 13:11   VAS [place] ABI WITH/WO TBI  Result Date: 02/29/2020 LOWER EXTREMITY DOPPLER STUDY Indications: Ulceration, and gangrene. High Risk Factors: Diabetes. Other Factors: Right mid foot amputation.  Limitations: Today's exam was limited due to IV in right AC, bandages on both              feet, edema, and tachycardia secondary to sepsis. Comparison Study: No prior study Performing Technologist: Sherren Kerns RVS  Examination Guidelines: A complete evaluation includes at minimum, Doppler waveform signals and systolic blood pressure reading at the level of bilateral brachial, anterior tibial, and posterior tibial arteries, when vessel segments are accessible. Bilateral testing is considered an integral part of a complete examination. Photoelectric Plethysmograph (PPG) waveforms and toe systolic pressure readings are included as required and additional duplex testing as needed. Limited examinations for reoccurring indications may be performed as noted.  ABI Findings: +---------+------------------+-----+-----------+----------+ Right    Rt Pressure (mmHg)IndexWaveform   Comment    +---------+------------------+-----+-----------+----------+ Brachial                        multiphasicIV in AC   +---------+------------------+-----+-----------+----------+ PTA      155               1.18 multiphasic           +---------+------------------+-----+-----------+----------+ DP       142               1.08 multiphasic           +---------+------------------+-----+-----------+----------+ Great Toe                                  amputation +---------+------------------+-----+-----------+----------+ +---------+------------------+-----+-----------+-------+ Left     Lt Pressure  (mmHg)IndexWaveform   Comment +---------+------------------+-----+-----------+-------+ Brachial 131                    multiphasic        +---------+------------------+-----+-----------+-------+ PTA      142               1.08 multiphasic        +---------+------------------+-----+-----------+-------+ DP       140               1.07 multiphasic        +---------+------------------+-----+-----------+-------+ Great Toe93                0.71                    +---------+------------------+-----+-----------+-------+ +-------+-----------+-----------+------------+------------+ ABI/TBIToday's ABIToday's TBIPrevious ABIPrevious TBI +-------+-----------+-----------+------------+------------+ Right  1.18       amp                                 +-------+-----------+-----------+------------+------------+  Left   1.08       0.71                                +-------+-----------+-----------+------------+------------+  Summary: Right: Resting right ankle-brachial index is within normal range. No evidence of significant right lower extremity arterial disease. Left: Resting left ankle-brachial index is within normal range. No evidence of significant left lower extremity arterial disease. The left toe-brachial index is normal.  *See table(s) above for measurements and observations.  Electronically signed by Lemar LivingsBrandon Cain MD on 02/29/2020 at 4:30:23 PM.    Final    ECHOCARDIOGRAM COMPLETE  Result Date: 03/03/2020    ECHOCARDIOGRAM REPORT   Patient Name:   Karren BurlyMICHAEL Strojny Date of Exam: 03/03/2020 Medical Rec #:  [id number]        Height:       73.0 in Accession #:    [id number]       Weight:       333.6 lb Date of Birth:  02/02/1981         BSA:          2.674 m Patient Age:    39 years         BP:           141/89 mmHg Patient Gender: M                HR:           77 bpm. Exam Location:  Inpatient Procedure: 2D Echo, Cardiac Doppler and Color Doppler Indications:    Bacteremia  [790.7.ICD-9-CM]  History:        Patient has no prior history of Echocardiogram examinations.                 Risk Factors:Diabetes, Sleep Apnea and Former Smoker.  Sonographer:    Renella CunasJulia Swaim RDCS Referring Phys: [id number] GREGORY D CALONE IMPRESSIONS  1. Left ventricular ejection fraction, by estimation, is 55 to 60%. The left ventricle has normal function. The left ventricle has no regional wall motion abnormalities. The left ventricular internal cavity size was mildly dilated. There is moderate left ventricular hypertrophy. Left ventricular diastolic parameters were normal.  2. Right ventricular systolic function is normal. The right ventricular size is mildly enlarged. Tricuspid regurgitation signal is inadequate for assessing PA pressure.  3. The mitral valve is normal in structure. Trivial mitral valve regurgitation.  4. The aortic valve is tricuspid. Aortic valve regurgitation is not visualized. No aortic stenosis is present.  5. Aortic dilatation noted. There is mild dilatation of the ascending aorta, measuring 37 mm.  6. The inferior vena cava is normal in size with greater than 50% respiratory variability, suggesting right atrial pressure of 3 mmHg. Conclusion(s)/Recommendation(s): No vegetation seen. FINDINGS  Left Ventricle: Left ventricular ejection fraction, by estimation, is 55 to 60%. The left ventricle has normal function. The left ventricle has no regional wall motion abnormalities. The left ventricular internal cavity size was mildly dilated. There is  moderate left ventricular hypertrophy. Left ventricular diastolic parameters were normal. Right Ventricle: The right ventricular size is mildly enlarged. No increase in right ventricular wall thickness. Right ventricular systolic function is normal. Tricuspid regurgitation signal is inadequate for assessing PA pressure. The tricuspid regurgitant velocity is 1.47 m/s, and with an assumed right atrial pressure of 3 mmHg, the estimated right  ventricular systolic pressure is 11.6 mmHg. Left Atrium: Left atrial size was normal in  size. Right Atrium: Right atrial size was normal in size. Pericardium: Trivial pericardial effusion is present. Mitral Valve: The mitral valve is normal in structure. Trivial mitral valve regurgitation. Tricuspid Valve: The tricuspid valve is normal in structure. Tricuspid valve regurgitation is trivial. Aortic Valve: The aortic valve is tricuspid. Aortic valve regurgitation is not visualized. No aortic stenosis is present. Pulmonic Valve: The pulmonic valve was grossly normal. Pulmonic valve regurgitation is not visualized. Aorta: The aortic root is normal in size and structure and aortic dilatation noted. There is mild dilatation of the ascending aorta, measuring 37 mm. Venous: The inferior vena cava is normal in size with greater than 50% respiratory variability, suggesting right atrial pressure of 3 mmHg. IAS/Shunts: The interatrial septum was not well visualized.  LEFT VENTRICLE PLAX 2D LVIDd:         5.90 cm      Diastology LVIDs:         4.00 cm      LV e' medial:    9.11 cm/s LV PW:         1.10 cm      LV E/e' medial:  10.5 LV IVS:        1.10 cm      LV e' lateral:   11.50 cm/s LVOT diam:     2.70 cm      LV E/e' lateral: 8.3 LV SV:         115 LV SV Index:   43 LVOT Area:     5.73 cm  LV Volumes (MOD) LV vol d, MOD A2C: 167.0 ml LV vol d, MOD A4C: 192.0 ml LV vol s, MOD A2C: 70.5 ml LV vol s, MOD A4C: 93.2 ml LV SV MOD A2C:     96.5 ml LV SV MOD A4C:     192.0 ml LV SV MOD BP:      106.6 ml RIGHT VENTRICLE RV S prime:     14.30 cm/s TAPSE (M-mode): 2.2 cm LEFT ATRIUM             Index       RIGHT ATRIUM           Index LA diam:        4.20 cm 1.57 cm/m  RA Area:     19.80 cm LA Vol (A2C):   56.7 ml 21.20 ml/m RA Volume:   61.70 ml  23.07 ml/m LA Vol (A4C):   31.3 ml 11.70 ml/m LA Biplane Vol: 44.4 ml 16.60 ml/m  AORTIC VALVE LVOT Vmax:   113.00 cm/s LVOT Vmean:  70.800 cm/s LVOT VTI:    0.201 m  AORTA Ao Root  diam: 3.90 cm Ao Asc diam:  3.70 cm MITRAL VALVE               TRICUSPID VALVE MV Area (PHT): 4.21 cm    TR Peak grad:   8.6 mmHg MV Decel Time: 180 msec    TR Vmax:        147.00 cm/s MV E velocity: 95.50 cm/s MV A velocity: 64.30 cm/s  SHUNTS MV E/A ratio:  1.49        Systemic VTI:  0.20 m                            Systemic Diam: 2.70 cm Epifanio Lesches MD Electronically signed by Epifanio Lesches MD Signature Date/Time: 03/03/2020/7:59:51 PM    Final    VAS [place]  LOWER EXTREMITY VENOUS (DVT)  Result Date: 03/06/2020  Lower Venous DVT Study Indications: Edema. Other Indications: Left foot osteomyelitis with multiple debridements. Limitations: Body habitus, bandages and wound vac, left lower extremity. Comparison Study: No prior study on file Performing Technologist: Sherren Kerns RVS  Examination Guidelines: A complete evaluation includes B-mode imaging, spectral Doppler, color Doppler, and power Doppler as needed of all accessible portions of each vessel. Bilateral testing is considered an integral part of a complete examination. Limited examinations for reoccurring indications may be performed as noted. The reflux portion of the exam is performed with the patient in reverse Trendelenburg.  +---------+---------------+---------+-----------+----------+-------------------+ RIGHT    CompressibilityPhasicitySpontaneityPropertiesThrombus Aging      +---------+---------------+---------+-----------+----------+-------------------+ CFV                     Yes      Yes                  patent by color and                                                       Doppler             +---------+---------------+---------+-----------+----------+-------------------+ FV Prox  Full                                                             +---------+---------------+---------+-----------+----------+-------------------+ FV Mid   Full                                                              +---------+---------------+---------+-----------+----------+-------------------+ FV DistalFull                                                             +---------+---------------+---------+-----------+----------+-------------------+ PFV      Full                                                             +---------+---------------+---------+-----------+----------+-------------------+ POP      Full           Yes      Yes                                      +---------+---------------+---------+-----------+----------+-------------------+ PTV      Full                                                             +---------+---------------+---------+-----------+----------+-------------------+  PERO     Full                                                             +---------+---------------+---------+-----------+----------+-------------------+   +---------+---------------+---------+-----------+----------+-------------------+ LEFT     CompressibilityPhasicitySpontaneityPropertiesThrombus Aging      +---------+---------------+---------+-----------+----------+-------------------+ CFV      Full           Yes      Yes                                      +---------+---------------+---------+-----------+----------+-------------------+ SFJ      Full                                                             +---------+---------------+---------+-----------+----------+-------------------+ FV Prox  Full                                                             +---------+---------------+---------+-----------+----------+-------------------+ FV Mid   Full                                                             +---------+---------------+---------+-----------+----------+-------------------+ FV DistalFull                                                              +---------+---------------+---------+-----------+----------+-------------------+ PFV      Full                                                             +---------+---------------+---------+-----------+----------+-------------------+ POP      Full           Yes      Yes                                      +---------+---------------+---------+-----------+----------+-------------------+ PTV                                                   Not well visualized +---------+---------------+---------+-----------+----------+-------------------+ PERO  Not well visualized +---------+---------------+---------+-----------+----------+-------------------+     Summary: RIGHT: - There is no evidence of deep vein thrombosis in the lower extremity. However, portions of this examination were limited- see technologist comments above.  LEFT: - There is no evidence of deep vein thrombosis in the lower extremity. However, portions of this examination were limited- see technologist comments above.  *See table(s) above for measurements and observations. Electronically signed by Waverly Ferrari MD on 03/06/2020 at 6:19:43 PM.    Final     Microbiology: No results found for this or any previous visit (from the past 240 hour(s)).   Labs: Basic Metabolic Panel: No results for input(s): NA, K, CL, CO2, GLUCOSE, BUN, CREATININE, CALCIUM, MG, PHOS in the last 168 hours. Liver Function Tests: No results for input(s): AST, ALT, ALKPHOS, BILITOT, PROT, ALBUMIN in the last 168 hours. No results for input(s): LIPASE, AMYLASE in the last 168 hours. No results for input(s): AMMONIA in the last 168 hours. CBC: No results for input(s): WBC, NEUTROABS, HGB, HCT, MCV, PLT in the last 168 hours. Cardiac Enzymes: No results for input(s): CKTOTAL, CKMB, CKMBINDEX, TROPONINI in the last 168 hours. BNP: BNP (last 3 results) No results for input(s): BNP in the  last 8760 hours.  ProBNP (last 3 results) No results for input(s): PROBNP in the last 8760 hours.  CBG: Recent Labs  Lab 03/16/20 1722 03/16/20 2130 03/17/20 0602  GLUCAP 105* 157* 152*       Signed:  Zannie Cove MD.  [facility] 03/23/2020, 4:02 PM

## 2020-03-24 ENCOUNTER — Ambulatory Visit (INDEPENDENT_AMBULATORY_CARE_PROVIDER_SITE_OTHER): Payer: Self-pay | Admitting: Orthopedic Surgery

## 2020-03-24 ENCOUNTER — Encounter: Payer: Self-pay | Admitting: Orthopedic Surgery

## 2020-03-24 VITALS — Ht 73.0 in | Wt 335.0 lb

## 2020-03-24 DIAGNOSIS — Z89512 Acquired absence of left leg below knee: Secondary | ICD-10-CM

## 2020-03-25 ENCOUNTER — Encounter: Payer: Self-pay | Admitting: Orthopedic Surgery

## 2020-03-25 ENCOUNTER — Emergency Department (HOSPITAL_BASED_OUTPATIENT_CLINIC_OR_DEPARTMENT_OTHER)
Admission: EM | Admit: 2020-03-25 | Discharge: 2020-03-25 | Disposition: A | Discharge status: Home / Self Care | Payer: Self-pay | Attending: Emergency Medicine | Admitting: Emergency Medicine

## 2020-03-25 ENCOUNTER — Other Ambulatory Visit: Payer: Self-pay

## 2020-03-25 ENCOUNTER — Emergency Department (HOSPITAL_BASED_OUTPATIENT_CLINIC_OR_DEPARTMENT_OTHER): Payer: Self-pay

## 2020-03-25 ENCOUNTER — Encounter (HOSPITAL_BASED_OUTPATIENT_CLINIC_OR_DEPARTMENT_OTHER): Payer: Self-pay | Admitting: Emergency Medicine

## 2020-03-25 DIAGNOSIS — E1169 Type 2 diabetes mellitus with other specified complication: Secondary | ICD-10-CM | POA: Insufficient documentation

## 2020-03-25 DIAGNOSIS — Z89512 Acquired absence of left leg below knee: Secondary | ICD-10-CM | POA: Insufficient documentation

## 2020-03-25 DIAGNOSIS — Z7984 Long term (current) use of oral hypoglycemic drugs: Secondary | ICD-10-CM | POA: Insufficient documentation

## 2020-03-25 DIAGNOSIS — L97529 Non-pressure chronic ulcer of other part of left foot with unspecified severity: Secondary | ICD-10-CM | POA: Insufficient documentation

## 2020-03-25 DIAGNOSIS — T8130XA Disruption of wound, unspecified, initial encounter: Secondary | ICD-10-CM

## 2020-03-25 DIAGNOSIS — T8131XA Disruption of external operation (surgical) wound, not elsewhere classified, initial encounter: Secondary | ICD-10-CM | POA: Insufficient documentation

## 2020-03-25 DIAGNOSIS — E1165 Type 2 diabetes mellitus with hyperglycemia: Secondary | ICD-10-CM | POA: Insufficient documentation

## 2020-03-25 DIAGNOSIS — Z79899 Other long term (current) drug therapy: Secondary | ICD-10-CM | POA: Insufficient documentation

## 2020-03-25 DIAGNOSIS — J45909 Unspecified asthma, uncomplicated: Secondary | ICD-10-CM | POA: Insufficient documentation

## 2020-03-25 DIAGNOSIS — E11621 Type 2 diabetes mellitus with foot ulcer: Secondary | ICD-10-CM | POA: Insufficient documentation

## 2020-03-25 DIAGNOSIS — E669 Obesity, unspecified: Secondary | ICD-10-CM | POA: Insufficient documentation

## 2020-03-25 DIAGNOSIS — E1142 Type 2 diabetes mellitus with diabetic polyneuropathy: Secondary | ICD-10-CM | POA: Insufficient documentation

## 2020-03-25 DIAGNOSIS — Z87891 Personal history of nicotine dependence: Secondary | ICD-10-CM | POA: Insufficient documentation
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
## 2020-03-25 NOTE — ED Triage Notes (Signed)
Reports having a left BKA 11/24.  Attempted to get up today and tripped over some toys falling onto the leg splitting the wound open.  Wrapped with a towel in triage.

## 2020-03-25 NOTE — ED Notes (Signed)
Wound to left lower extremity dressed with 4 X 4 's Wet to dry, ABD Pad, Kerlix, and Ace Wrap.

## 2020-03-25 NOTE — Discharge Instructions (Signed)
Please go to [facility] first thing Monday morning to see either Dr. Lajoyce Corners or Dr. Magnus Ivan for further evaluation of your open leg wound. Keep dressing in place until you can see them.   Return to the ED IMMEDIATELY for any new/worsening symptoms including redness/swelling of your leg, fevers > 100.4, chills, bleeding through the dressing, or any other new/concerning symptoms.
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
## 2020-03-28 ENCOUNTER — Ambulatory Visit (INDEPENDENT_AMBULATORY_CARE_PROVIDER_SITE_OTHER): Payer: Self-pay | Admitting: Orthopedic Surgery

## 2020-03-28 ENCOUNTER — Other Ambulatory Visit: Payer: Self-pay | Admitting: Physician Assistant

## 2020-03-28 ENCOUNTER — Other Ambulatory Visit: Payer: Self-pay

## 2020-03-28 ENCOUNTER — Encounter: Payer: Self-pay | Admitting: Orthopedic Surgery

## 2020-03-28 DIAGNOSIS — T8781 Dehiscence of amputation stump: Secondary | ICD-10-CM
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
## 2020-03-29 ENCOUNTER — Other Ambulatory Visit: Payer: Self-pay

## 2020-03-29 ENCOUNTER — Encounter: Payer: Self-pay | Attending: Registered Nurse | Admitting: Registered Nurse

## 2020-03-29 ENCOUNTER — Encounter (HOSPITAL_COMMUNITY): Payer: Self-pay | Admitting: Orthopedic Surgery

## 2020-03-29 ENCOUNTER — Other Ambulatory Visit (HOSPITAL_COMMUNITY): Payer: Self-pay

## 2020-03-29 MED ORDER — DEXTROSE 5 % IV SOLN
3.0000 g | INTRAVENOUS | Status: DC
  Filled 2020-03-29: qty 3000

## 2020-03-29 NOTE — Progress Notes (Addendum)
Ronnie Shaw denies chest or shortness of breath.  Patient denies any s/s for himself or anyone he has been in contact with.  Ronnie Shaw will be tested on arrival tomorrow.  Ronnie Shaw has type II diabetes. I instructed patient to not take Glyplizide  In am. I instructed patient to check CBG after awaking and every 2 hours until arrival  to te hospital.  I Instructed patient if CBG is less than 70 to drink  1/2 cup of a clear juice. Recheck CBG in 15 minutes then call pre- op desk at [phone] for further instructions.

## 2020-03-30 ENCOUNTER — Ambulatory Visit (HOSPITAL_COMMUNITY): Payer: Self-pay | Admitting: Certified Registered"

## 2020-03-30 ENCOUNTER — Encounter (HOSPITAL_COMMUNITY): Payer: Self-pay | Admitting: Orthopedic Surgery

## 2020-03-30 ENCOUNTER — Ambulatory Visit (HOSPITAL_COMMUNITY)
Admission: RE | Admit: 2020-03-30 | Discharge: 2020-03-30 | Disposition: A | Discharge status: Home / Self Care | Payer: Self-pay | Attending: Orthopedic Surgery | Admitting: Orthopedic Surgery

## 2020-03-30 ENCOUNTER — Encounter (HOSPITAL_COMMUNITY)
Admission: RE | Disposition: A | Discharge status: Home / Self Care | Payer: Self-pay | Source: Home / Self Care | Attending: Orthopedic Surgery

## 2020-03-30 DIAGNOSIS — Z20822 Contact with and (suspected) exposure to covid-19: Secondary | ICD-10-CM | POA: Insufficient documentation

## 2020-03-30 DIAGNOSIS — Z888 Allergy status to other drugs, medicaments and biological substances status: Secondary | ICD-10-CM | POA: Insufficient documentation

## 2020-03-30 DIAGNOSIS — Z87891 Personal history of nicotine dependence: Secondary | ICD-10-CM | POA: Insufficient documentation

## 2020-03-30 DIAGNOSIS — T8781 Dehiscence of amputation stump: Secondary | ICD-10-CM | POA: Insufficient documentation

## 2020-03-30 DIAGNOSIS — Z89512 Acquired absence of left leg below knee: Secondary | ICD-10-CM | POA: Insufficient documentation

## 2020-03-30 DIAGNOSIS — W010XXA Fall on same level from slipping, tripping and stumbling without subsequent striking against object, initial encounter: Secondary | ICD-10-CM | POA: Insufficient documentation

## 2020-03-30 HISTORY — DX: Gastro-esophageal reflux disease without esophagitis: K21.9

## 2020-03-30 HISTORY — PX: AMPUTATION: SHX166

## 2020-03-30 HISTORY — DX: Peripheral vascular disease, unspecified: I73.9

## 2020-03-30 HISTORY — DX: Essential (primary) hypertension: I10

## 2020-03-30 HISTORY — DX: Personal history of other medical treatment: Z92.89

## 2020-03-30 LAB — COMPREHENSIVE METABOLIC PANEL
ALT: 19 U/L (ref 0–44)
AST: 19 U/L (ref 15–41)
Albumin: 2.7 g/dL — ABNORMAL LOW (ref 3.5–5.0)
Alkaline Phosphatase: 88 U/L (ref 38–126)
Anion gap: 8 (ref 5–15)
BUN: 22 mg/dL — ABNORMAL HIGH (ref 6–20)
CO2: 25 mmol/L (ref 22–32)
Calcium: 8.9 mg/dL (ref 8.9–10.3)
Chloride: 103 mmol/L (ref 98–111)
Creatinine, Ser: 1.58 mg/dL — ABNORMAL HIGH (ref 0.61–1.24)
GFR, Estimated: 57 mL/min — ABNORMAL LOW (ref >60)
Glucose, Bld: 98 mg/dL (ref 70–99)
Potassium: 4.9 mmol/L (ref 3.5–5.1)
Sodium: 136 mmol/L (ref 135–145)
Total Bilirubin: 0.6 mg/dL (ref 0.3–1.2)
Total Protein: 7.5 g/dL (ref 6.5–8.1)

## 2020-03-30 LAB — CBC
HCT: 27.6 % — ABNORMAL LOW (ref 39.0–52.0)
Hemoglobin: 8.9 g/dL — ABNORMAL LOW (ref 13.0–17.0)
MCH: 29.7 pg (ref 26.0–34.0)
MCHC: 32.2 g/dL (ref 30.0–36.0)
MCV: 92 fL (ref 80.0–100.0)
Platelets: 278 10*3/uL (ref 150–400)
RBC: 3 MIL/uL — ABNORMAL LOW (ref 4.22–5.81)
RDW: 17 % — ABNORMAL HIGH (ref 11.5–15.5)
WBC: 5.3 10*3/uL (ref 4.0–10.5)
nRBC: 0 % (ref 0.0–0.2)

## 2020-03-30 LAB — GLUCOSE, CAPILLARY
Glucose-Capillary: 109 mg/dL — ABNORMAL HIGH (ref 70–99)
Glucose-Capillary: 134 mg/dL — ABNORMAL HIGH (ref 70–99)

## 2020-03-30 LAB — SARS CORONAVIRUS 2 BY RT PCR (HOSPITAL ORDER, PERFORMED IN ~~LOC~~ HOSPITAL LAB): SARS Coronavirus 2: NEGATIVE

## 2020-03-30 SURGERY — AMPUTATION BELOW KNEE
Anesthesia: General | Site: Knee | Laterality: Left

## 2020-03-30 MED ORDER — PROPOFOL 10 MG/ML IV BOLUS
INTRAVENOUS | Status: DC | PRN
  Administered 2020-03-30: 250 mg via INTRAVENOUS

## 2020-03-30 MED ORDER — CHLORHEXIDINE GLUCONATE 0.12 % MT SOLN: 15.0000 mL | Freq: Once | OROMUCOSAL | Status: AC

## 2020-03-30 MED ORDER — HYDROMORPHONE HCL 1 MG/ML IJ SOLN
INTRAMUSCULAR | Status: AC
  Administered 2020-03-30: 15:00:00 0.5 mg via INTRAVENOUS
  Filled 2020-03-30: qty 1

## 2020-03-30 MED ORDER — MIDAZOLAM HCL 2 MG/2ML IJ SOLN
INTRAMUSCULAR | Status: AC
  Filled 2020-03-30: qty 2

## 2020-03-30 MED ORDER — PHENYLEPHRINE 40 MCG/ML (10ML) SYRINGE FOR IV PUSH (FOR BLOOD PRESSURE SUPPORT)
PREFILLED_SYRINGE | INTRAVENOUS | Status: AC
  Filled 2020-03-30: qty 20

## 2020-03-30 MED ORDER — CHLORHEXIDINE GLUCONATE 0.12 % MT SOLN
OROMUCOSAL | Status: AC
  Administered 2020-03-30: 12:00:00 15 mL via OROMUCOSAL
  Filled 2020-03-30: qty 15

## 2020-03-30 MED ORDER — OXYCODONE HCL 5 MG/5ML PO SOLN
ORAL | Status: AC
  Administered 2020-03-30: 16:00:00 5 mg via ORAL
  Filled 2020-03-30: qty 5

## 2020-03-30 MED ORDER — HYDROMORPHONE HCL 1 MG/ML IJ SOLN
INTRAMUSCULAR | Status: AC
  Filled 2020-03-30: qty 1

## 2020-03-30 MED ORDER — FENTANYL CITRATE (PF) 100 MCG/2ML IJ SOLN
INTRAMUSCULAR | Status: DC | PRN
  Administered 2020-03-30: 50 ug via INTRAVENOUS
  Administered 2020-03-30 (×2): 25 ug via INTRAVENOUS

## 2020-03-30 MED ORDER — DEXTROSE 5 % IV SOLN
INTRAVENOUS | Status: DC | PRN
  Administered 2020-03-30: 14:00:00 3 g via INTRAVENOUS

## 2020-03-30 MED ORDER — LIDOCAINE 2% (20 MG/ML) 5 ML SYRINGE
INTRAMUSCULAR | Status: DC | PRN
  Administered 2020-03-30: 60 mg via INTRAVENOUS

## 2020-03-30 MED ORDER — HYDROMORPHONE HCL 1 MG/ML IJ SOLN
0.2500 mg | INTRAMUSCULAR | Status: DC | PRN
  Administered 2020-03-30 (×2): 0.5 mg via INTRAVENOUS

## 2020-03-30 MED ORDER — PHENYLEPHRINE 40 MCG/ML (10ML) SYRINGE FOR IV PUSH (FOR BLOOD PRESSURE SUPPORT)
PREFILLED_SYRINGE | INTRAVENOUS | Status: AC
  Filled 2020-03-30: qty 10

## 2020-03-30 MED ORDER — 0.9 % SODIUM CHLORIDE (POUR BTL) OPTIME
TOPICAL | Status: DC | PRN
  Administered 2020-03-30: 14:00:00 1000 mL

## 2020-03-30 MED ORDER — ORAL CARE MOUTH RINSE: 15.0000 mL | Freq: Once | OROMUCOSAL | Status: AC

## 2020-03-30 MED ORDER — FENTANYL CITRATE (PF) 100 MCG/2ML IJ SOLN
INTRAMUSCULAR | Status: AC
  Filled 2020-03-30: qty 2

## 2020-03-30 MED ORDER — ONDANSETRON HCL 4 MG/2ML IJ SOLN
INTRAMUSCULAR | Status: DC | PRN
  Administered 2020-03-30: 4 mg via INTRAVENOUS

## 2020-03-30 MED ORDER — OXYCODONE-ACETAMINOPHEN 5-325 MG PO TABS: 1.0000 | ORAL_TABLET | ORAL | 0 refills | Status: DC | PRN

## 2020-03-30 MED ORDER — LACTATED RINGERS IV SOLN: INTRAVENOUS | Status: DC

## 2020-03-30 MED ORDER — DEXMEDETOMIDINE (PRECEDEX) IN NS 20 MCG/5ML (4 MCG/ML) IV SYRINGE
PREFILLED_SYRINGE | INTRAVENOUS | Status: DC | PRN
  Administered 2020-03-30 (×2): 8 ug via INTRAVENOUS

## 2020-03-30 MED ORDER — PROPOFOL 10 MG/ML IV BOLUS
INTRAVENOUS | Status: AC
  Filled 2020-03-30: qty 20

## 2020-03-30 MED ORDER — ACETAMINOPHEN 500 MG PO TABS
1000.0000 mg | ORAL_TABLET | Freq: Once | ORAL | Status: AC
  Administered 2020-03-30: 12:00:00 1000 mg via ORAL
  Filled 2020-03-30: qty 2

## 2020-03-30 MED ORDER — MIDAZOLAM HCL 5 MG/5ML IJ SOLN
INTRAMUSCULAR | Status: DC | PRN
  Administered 2020-03-30: 2 mg via INTRAVENOUS

## 2020-03-30 MED ORDER — PHENYLEPHRINE 40 MCG/ML (10ML) SYRINGE FOR IV PUSH (FOR BLOOD PRESSURE SUPPORT)
PREFILLED_SYRINGE | INTRAVENOUS | Status: DC | PRN
  Administered 2020-03-30: 120 ug via INTRAVENOUS
  Administered 2020-03-30: 80 ug via INTRAVENOUS
  Administered 2020-03-30: 40 ug via INTRAVENOUS
  Administered 2020-03-30 (×3): 80 ug via INTRAVENOUS

## 2020-03-30 MED ORDER — OXYCODONE HCL 5 MG/5ML PO SOLN: 5.0000 mg | Freq: Once | ORAL | Status: AC

## 2020-03-30 SURGICAL SUPPLY — 37 items
BLADE SAW RECIP 87.9 MT (BLADE) ×3 IMPLANT
BLADE SURG 21 STRL SS (BLADE) ×3 IMPLANT
BNDG COHESIVE 6X5 TAN STRL LF (GAUZE/BANDAGES/DRESSINGS) ×3 IMPLANT
CANISTER WOUND CARE 500ML ATS (WOUND CARE) ×3 IMPLANT
CANISTER WOUNDNEG PRESSURE 500 (CANNISTER) ×3 IMPLANT
COVER SURGICAL LIGHT HANDLE (MISCELLANEOUS) ×3 IMPLANT
DRAPE DERMATAC (DRAPES) ×9 IMPLANT
DRAPE INCISE IOBAN 66X45 STRL (DRAPES) ×3 IMPLANT
DRAPE U-SHAPE 47X51 STRL (DRAPES) ×3 IMPLANT
DRESSING PREVENA PLUS CUSTOM (GAUZE/BANDAGES/DRESSINGS) ×1 IMPLANT
DRSG PREVENA PLUS CUSTOM (GAUZE/BANDAGES/DRESSINGS) ×3
DURAPREP 26ML APPLICATOR (WOUND CARE) ×3 IMPLANT
ELECT REM PT RETURN 9FT ADLT (ELECTROSURGICAL) ×3
ELECTRODE REM PT RTRN 9FT ADLT (ELECTROSURGICAL) ×1 IMPLANT
GLOVE BIOGEL PI IND STRL 9 (GLOVE) ×1 IMPLANT
GLOVE BIOGEL PI INDICATOR 9 (GLOVE) ×2
GLOVE SURG ORTHO 9.0 STRL STRW (GLOVE) ×3 IMPLANT
GOWN STRL REUS W/ TWL XL LVL3 (GOWN DISPOSABLE) ×2 IMPLANT
GOWN STRL REUS W/TWL XL LVL3 (GOWN DISPOSABLE) ×4
KIT BASIN OR (CUSTOM PROCEDURE TRAY) ×3 IMPLANT
KIT TURNOVER KIT B (KITS) ×3 IMPLANT
MANIFOLD NEPTUNE II (INSTRUMENTS) ×3 IMPLANT
NS IRRIG 1000ML POUR BTL (IV SOLUTION) ×3 IMPLANT
PACK ORTHO EXTREMITY (CUSTOM PROCEDURE TRAY) ×3 IMPLANT
PAD ARMBOARD 7.5X6 YLW CONV (MISCELLANEOUS) ×3 IMPLANT
PREVENA RESTOR ARTHOFORM 46X30 (CANNISTER) ×3 IMPLANT
SPONGE LAP 18X18 RF (DISPOSABLE) ×9 IMPLANT
STAPLER VISISTAT 35W (STAPLE) ×3 IMPLANT
STOCKINETTE IMPERVIOUS LG (DRAPES) ×3 IMPLANT
SUT ETHILON 2 0 PSLX (SUTURE) ×12 IMPLANT
SUT SILK 2 0 (SUTURE) ×2
SUT SILK 2-0 18XBRD TIE 12 (SUTURE) ×1 IMPLANT
SUT VIC AB 1 CTX 27 (SUTURE) IMPLANT
TOWEL GREEN STERILE (TOWEL DISPOSABLE) ×3 IMPLANT
TUBE CONNECTING 12'X1/4 (SUCTIONS) ×1
TUBE CONNECTING 12X1/4 (SUCTIONS) ×2 IMPLANT
YANKAUER SUCT BULB TIP NO VENT (SUCTIONS) ×3 IMPLANT

## 2020-03-30 NOTE — Interval H&P Note (Signed)
History and Physical Interval Note:  03/30/2020 11:59 AM  Ronnie Shaw  has presented today for surgery, with the diagnosis of dehiscence left below knee amputation.  The various methods of treatment have been discussed with the patient and family. After consideration of risks, benefits and other options for treatment, the patient has consented to  Procedure(s): LEFT AMPUTATION BELOW KNEE REVISION (Left) as a surgical intervention.  The patient's history has been reviewed, patient examined, no change in status, stable for surgery.  I have reviewed the patient's chart and labs.  Questions were answered to the patient's satisfaction.     [name]

## 2020-03-30 NOTE — Anesthesia Procedure Notes (Signed)
Procedure Name: LMA Insertion Date/Time: 03/30/2020 1:39 PM Performed by: Marny Lowenstein, CRNA Pre-anesthesia Checklist: Patient identified, Emergency Drugs available, Suction available and Patient being monitored Patient Re-evaluated:Patient Re-evaluated prior to induction Oxygen Delivery Method: Circle system utilized Preoxygenation: Pre-oxygenation with 100% oxygen Induction Type: IV induction Ventilation: Mask ventilation without difficulty and Oral airway inserted - appropriate to patient size LMA: LMA inserted LMA Size: 5.0 Number of attempts: 1 Airway Equipment and Method: Patient positioned with wedge pillow Placement Confirmation: positive ETCO2 and breath sounds checked- equal and bilateral Tube secured with: Tape Dental Injury: Teeth and Oropharynx as per pre-operative assessment

## 2020-03-30 NOTE — Anesthesia Postprocedure Evaluation (Signed)
Anesthesia Post Note  Patient: Ronnie Shaw  Procedure(s) Performed: LEFT AMPUTATION BELOW KNEE REVISION (Left Knee)     Patient location during evaluation: PACU Anesthesia Type: General Level of consciousness: awake and alert Pain management: pain level controlled Vital Signs Assessment: post-procedure vital signs reviewed and stable Respiratory status: spontaneous breathing, nonlabored ventilation and respiratory function stable Cardiovascular status: blood pressure returned to baseline and stable Postop Assessment: no apparent nausea or vomiting Anesthetic complications: no   No complications documented.  Last Vitals:  Vitals:   03/30/20 1500 03/30/20 1530  BP: (!) 110/46 (!) 124/52  Pulse: 84 80  Resp: 13 11  Temp:    SpO2: 100% 100%    Last Pain:  Vitals:   03/30/20 1530  TempSrc:   PainSc: 5                  Cecile Hearing
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
## 2020-03-30 NOTE — Transfer of Care (Signed)
Immediate Anesthesia Transfer of Care Note  Patient: Ronnie Shaw  Procedure(s) Performed: LEFT AMPUTATION BELOW KNEE REVISION (Left Knee)  Patient Location: PACU  Anesthesia Type:General  Level of Consciousness: awake, alert  and oriented  Airway & Oxygen Therapy: Patient Spontanous Breathing  Post-op Assessment: Report given to RN and Post -op Vital signs reviewed and stable  Post vital signs: Reviewed and stable  Last Vitals:  Vitals Value Taken Time  BP    Temp    Pulse 88 03/30/20 1433  Resp 19 03/30/20 1434  SpO2 100 % 03/30/20 1433  Vitals shown include unvalidated device data.  Last Pain:  Vitals:   03/30/20 1135  TempSrc:   PainSc: 5       Patients Stated Pain Goal: 2 (03/30/20 1135)  Complications: No complications documented.

## 2020-03-30 NOTE — Anesthesia Preprocedure Evaluation (Addendum)
Anesthesia Evaluation  Patient identified by MRN, date of birth, ID band Patient awake    Reviewed: Allergy & Precautions, H&P , NPO status , Patient's Chart, lab work & pertinent test results  Airway Mallampati: III  TM Distance: >3 FB Neck ROM: Full    Dental no notable dental hx. (+) Poor Dentition, Missing, Chipped   Pulmonary asthma , sleep apnea , Patient abstained from smoking., former smoker,    Pulmonary exam normal breath sounds clear to auscultation       Cardiovascular hypertension, Pt. on medications + Peripheral Vascular Disease  Normal cardiovascular exam Rhythm:Regular Rate:Normal     Neuro/Psych Seizures -,  PSYCHIATRIC DISORDERS Depression  Neuromuscular disease    GI/Hepatic Neg liver ROS, GERD  Medicated,  Endo/Other  diabetes, Type 2, Oral Hypoglycemic AgentsMorbid obesity  Renal/GU Renal InsufficiencyRenal disease  negative genitourinary   Musculoskeletal   Abdominal   Peds  Hematology  (+) Blood dyscrasia, anemia ,   Anesthesia Other Findings   Reproductive/Obstetrics negative OB ROS                            Anesthesia Physical Anesthesia Plan  ASA: III  Anesthesia Plan: General   Post-op Pain Management:    Induction: Intravenous  PONV Risk Score and Plan: 2 and Ondansetron, Midazolam and Dexamethasone  Airway Management Planned: LMA  Additional Equipment:   Intra-op Plan:   Post-operative Plan: Extubation in OR  Informed Consent: I have reviewed the patients History and Physical, chart, labs and discussed the procedure including the risks, benefits and alternatives for the proposed anesthesia with the patient or authorized representative who has indicated his/her understanding and acceptance.     Dental advisory given  Plan Discussed with: CRNA  Anesthesia Plan Comments:        Anesthesia Quick Evaluation

## 2020-03-30 NOTE — Op Note (Signed)
03/30/2020  2:38 PM  PATIENT:  Ronnie Shaw    PRE-OPERATIVE DIAGNOSIS:  dehiscence left below knee amputation  POST-OPERATIVE DIAGNOSIS:  Same  PROCEDURE:  LEFT AMPUTATION BELOW KNEE REVISION  SURGEON:  [name], MD  PHYSICIAN ASSISTANT:None ANESTHESIA:   General  PREOPERATIVE INDICATIONS:  Ronnie Shaw is a  39 y.o. male with a diagnosis of dehiscence left below knee amputation who failed conservative measures and elected for surgical management.    The risks benefits and alternatives were discussed with the patient preoperatively including but not limited to the risks of infection, bleeding, nerve injury, cardiopulmonary complications, the need for revision surgery, among others, and the patient was willing to proceed.  OPERATIVE IMPLANTS: Praveena customizable and VAC  @ENCIMAGES @  OPERATIVE FINDINGS: Patient had extensive muscle soft tissue injury with necrosis and a massive hematoma.  OPERATIVE PROCEDURE: Patient was brought to the operating room and underwent a general anesthetic.  After adequate levels anesthesia were obtained patient's left lower extremity was prepped using DuraPrep draped into a sterile field a timeout was called.  Elliptical incision was made around the area of wound dehiscence from his recent fall from the residual limb examination showed extensive hematoma that extended transversely across the amputated stump there is also extensive necrotic muscle.  This necessitated ellipsing out the entire previous incision.  With necrotic muscle and soft tissue fascia was sharply excised with a 21 blade knife and a rondure anterior distal centimeter of the tibia and fibula were resected the anterior aspect of the tibia was beveled.  Electrocautery was used hemostasis the wound was irrigated with normal saline the remaining muscle had good color contractility and consistency.  All soft tissue.  The deep and superficial fascia layers and skin was closed using  2-0 nylon a Prevena customizable and arthroform wound VAC were applied this was outlined with derma tack and had a good suction fit patient was extubated taken the PACU in stable condition   DISCHARGE PLANNING:  Antibiotic duration: preoperative antibiotics Kefzol  Weightbearing: NWB left  Pain medication: percocet  Dressing care/ Wound VAC:VAC for 1 week  Ambulatory devices:walker  Discharge to: home  Follow-up: In the office 1 week post operative.

## 2020-03-30 NOTE — OR Nursing (Signed)
Pt is ambulatory to wheelchair with stand by assist .  Pt taken to front lobby and placed in car with family.   prevena in place and working, education given tofamily, pt has had wv before

## 2020-03-30 NOTE — OR Nursing (Signed)
Pt is awake,alert and oriented. Pt is in NAD at this time. Pt and family verbalized understanding of poc and discharge instructions. instructions given to family and reviewed prior to discharge. OR staff at bedside to readjust prevena and wrap with coban.pt prevena reattached and working

## 2020-03-31 ENCOUNTER — Ambulatory Visit: Payer: Self-pay | Admitting: Orthopedic Surgery

## 2020-03-31 ENCOUNTER — Encounter (HOSPITAL_COMMUNITY): Payer: Self-pay | Admitting: Orthopedic Surgery

## 2020-03-31 ENCOUNTER — Ambulatory Visit (INDEPENDENT_AMBULATORY_CARE_PROVIDER_SITE_OTHER): Payer: Self-pay | Admitting: Physician Assistant

## 2020-03-31 DIAGNOSIS — M86172 Other acute osteomyelitis, left ankle and foot: Secondary | ICD-10-CM

## 2020-03-31 MED ORDER — CYCLOBENZAPRINE HCL 5 MG PO TABS: 5.0000 mg | ORAL_TABLET | Freq: Three times a day (TID) | ORAL | 0 refills | Status: DC | PRN
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
## 2020-04-06 ENCOUNTER — Inpatient Hospital Stay: Payer: Self-pay | Admitting: Physician Assistant

## 2020-04-11 ENCOUNTER — Inpatient Hospital Stay: Payer: Self-pay | Admitting: Physician Assistant

## 2020-04-12 ENCOUNTER — Encounter: Payer: Self-pay | Admitting: Physician Assistant

## 2020-04-12 ENCOUNTER — Ambulatory Visit (INDEPENDENT_AMBULATORY_CARE_PROVIDER_SITE_OTHER): Payer: Self-pay | Admitting: Physician Assistant

## 2020-04-12 VITALS — Ht 73.0 in | Wt 300.0 lb

## 2020-04-12 DIAGNOSIS — M86171 Other acute osteomyelitis, right ankle and foot: Secondary | ICD-10-CM

## 2020-04-12 MED ORDER — DOXYCYCLINE HYCLATE 100 MG PO TABS: 100.0000 mg | ORAL_TABLET | Freq: Two times a day (BID) | ORAL | 0 refills | Status: AC

## 2020-04-12 MED ORDER — OXYCODONE-ACETAMINOPHEN 10-325 MG PO TABS: 1.0000 | ORAL_TABLET | ORAL | 0 refills | Status: DC | PRN
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
## 2020-04-15 ENCOUNTER — Ambulatory Visit: Payer: Self-pay | Attending: Nurse Practitioner | Admitting: Nurse Practitioner

## 2020-04-15 ENCOUNTER — Other Ambulatory Visit: Payer: Self-pay

## 2020-04-15 ENCOUNTER — Encounter: Payer: Self-pay | Admitting: Nurse Practitioner

## 2020-04-15 DIAGNOSIS — R52 Pain, unspecified: Secondary | ICD-10-CM

## 2020-04-15 DIAGNOSIS — I10 Essential (primary) hypertension: Secondary | ICD-10-CM

## 2020-04-15 DIAGNOSIS — K5909 Other constipation: Secondary | ICD-10-CM

## 2020-04-15 DIAGNOSIS — Z09 Encounter for follow-up examination after completed treatment for conditions other than malignant neoplasm: Secondary | ICD-10-CM

## 2020-04-15 DIAGNOSIS — K219 Gastro-esophageal reflux disease without esophagitis: Secondary | ICD-10-CM

## 2020-04-15 DIAGNOSIS — E1165 Type 2 diabetes mellitus with hyperglycemia: Secondary | ICD-10-CM

## 2020-04-15 MED ORDER — AMLODIPINE BESYLATE 10 MG PO TABS: 10.0000 mg | ORAL_TABLET | Freq: Every day | ORAL | 1 refills | Status: AC

## 2020-04-15 MED ORDER — ATORVASTATIN CALCIUM 20 MG PO TABS: 20.0000 mg | ORAL_TABLET | Freq: Every day | ORAL | 3 refills | Status: AC

## 2020-04-15 MED ORDER — SENNOSIDES-DOCUSATE SODIUM 8.6-50 MG PO TABS: 2.0000 | ORAL_TABLET | Freq: Two times a day (BID) | ORAL | 1 refills | Status: AC

## 2020-04-15 MED ORDER — CYCLOBENZAPRINE HCL 5 MG PO TABS
5.0000 mg | ORAL_TABLET | Freq: Three times a day (TID) | ORAL | 1 refills | Status: AC | PRN
Start: 2020-04-15 — End: ?

## 2020-04-15 MED ORDER — PANTOPRAZOLE SODIUM 40 MG PO TBEC
40.0000 mg | DELAYED_RELEASE_TABLET | Freq: Every day | ORAL | 1 refills | Status: AC
Start: 2020-04-15 — End: 2020-07-14

## 2020-04-15 MED ORDER — GLIPIZIDE 5 MG PO TABS: 2.5000 mg | ORAL_TABLET | Freq: Every day | ORAL | 1 refills | Status: AC
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
## 2020-04-19 ENCOUNTER — Ambulatory Visit (INDEPENDENT_AMBULATORY_CARE_PROVIDER_SITE_OTHER): Payer: Self-pay | Admitting: Orthopedic Surgery

## 2020-04-19 ENCOUNTER — Encounter: Payer: Self-pay | Admitting: Orthopedic Surgery

## 2020-04-19 VITALS — Ht 73.0 in | Wt 300.0 lb

## 2020-04-19 DIAGNOSIS — T8781 Dehiscence of amputation stump: Secondary | ICD-10-CM

## 2020-04-19 DIAGNOSIS — Z89512 Acquired absence of left leg below knee: Secondary | ICD-10-CM

## 2020-04-19 DIAGNOSIS — L97411 Non-pressure chronic ulcer of right heel and midfoot limited to breakdown of skin: Secondary | ICD-10-CM

## 2020-04-20 ENCOUNTER — Encounter: Payer: Self-pay | Admitting: Orthopedic Surgery
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
## 2020-04-25 ENCOUNTER — Other Ambulatory Visit: Payer: Self-pay

## 2020-04-25 ENCOUNTER — Other Ambulatory Visit: Payer: Self-pay | Admitting: Nurse Practitioner

## 2020-04-25 DIAGNOSIS — D649 Anemia, unspecified: Secondary | ICD-10-CM

## 2020-04-25 DIAGNOSIS — E1165 Type 2 diabetes mellitus with hyperglycemia: Secondary | ICD-10-CM

## 2020-04-25 DIAGNOSIS — I10 Essential (primary) hypertension: Secondary | ICD-10-CM

## 2020-04-28 ENCOUNTER — Ambulatory Visit (INDEPENDENT_AMBULATORY_CARE_PROVIDER_SITE_OTHER): Payer: Self-pay | Admitting: Orthopedic Surgery

## 2020-04-28 ENCOUNTER — Other Ambulatory Visit: Payer: Self-pay

## 2020-04-28 DIAGNOSIS — Z89512 Acquired absence of left leg below knee: Secondary | ICD-10-CM

## 2020-04-28 DIAGNOSIS — T8781 Dehiscence of amputation stump: Secondary | ICD-10-CM

## 2020-04-28 MED ORDER — GABAPENTIN 400 MG PO CAPS: 400.0000 mg | ORAL_CAPSULE | Freq: Three times a day (TID) | ORAL | 0 refills | Status: AC

## 2020-04-28 MED ORDER — OXYCODONE-ACETAMINOPHEN 10-325 MG PO TABS: 1.0000 | ORAL_TABLET | ORAL | 0 refills | Status: DC | PRN

## 2020-04-29 ENCOUNTER — Encounter: Payer: Self-pay | Admitting: Orthopedic Surgery
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
## 2020-05-16 ENCOUNTER — Ambulatory Visit: Payer: Self-pay | Admitting: Orthopedic Surgery

## 2020-05-17 ENCOUNTER — Ambulatory Visit: Payer: Self-pay | Admitting: Orthopedic Surgery

## 2020-05-23 ENCOUNTER — Encounter: Payer: Self-pay | Admitting: Orthopedic Surgery

## 2020-05-23 ENCOUNTER — Ambulatory Visit (INDEPENDENT_AMBULATORY_CARE_PROVIDER_SITE_OTHER): Payer: Self-pay | Admitting: Orthopedic Surgery

## 2020-05-23 VITALS — Ht 73.0 in | Wt 300.0 lb

## 2020-05-23 DIAGNOSIS — T8781 Dehiscence of amputation stump: Secondary | ICD-10-CM

## 2020-05-23 DIAGNOSIS — Z89512 Acquired absence of left leg below knee: Secondary | ICD-10-CM

## 2020-05-23 MED ORDER — OXYCODONE-ACETAMINOPHEN 10-325 MG PO TABS: 1.0000 | ORAL_TABLET | ORAL | 0 refills | Status: AC | PRN
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
## 2020-05-27 ENCOUNTER — Ambulatory Visit: Payer: Self-pay | Admitting: Nurse Practitioner

## 2020-06-13 ENCOUNTER — Ambulatory Visit: Payer: Self-pay | Admitting: Orthopedic Surgery

## 2020-06-28 ENCOUNTER — Ambulatory Visit: Payer: Self-pay | Admitting: Orthopedic Surgery

## 2020-07-04 ENCOUNTER — Ambulatory Visit (INDEPENDENT_AMBULATORY_CARE_PROVIDER_SITE_OTHER): Payer: Self-pay | Admitting: Physician Assistant

## 2020-07-04 ENCOUNTER — Encounter: Payer: Self-pay | Admitting: Orthopedic Surgery

## 2020-07-04 ENCOUNTER — Other Ambulatory Visit: Payer: Self-pay

## 2020-07-04 DIAGNOSIS — Z89512 Acquired absence of left leg below knee: Secondary | ICD-10-CM
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
## 2020-07-07 ENCOUNTER — Telehealth: Payer: Self-pay | Admitting: Orthopedic Surgery

## 2020-07-07 NOTE — Telephone Encounter (Signed)
Patient's godmother Steward Drone called asking for a program that will help with cost of prostatic leg. She states patient has no insurance and is pending medicaid. She is asking for a call back at [phone].

## 2020-07-07 NOTE — Telephone Encounter (Signed)
Message sent to [facility] to see if there is any program available to help pt obtain prosthetic.  Will hold pending advisement.

## 2020-07-07 NOTE — Telephone Encounter (Signed)
Pt has an appt on Thursday at [place] and [employer] advised that there are things that they can do and will work with the pt. Called to notify family.

## 2020-08-01 ENCOUNTER — Ambulatory Visit: Payer: Self-pay | Admitting: Orthopedic Surgery

## 2020-08-14 DIAGNOSIS — Z419 Encounter for procedure for purposes other than remedying health state, unspecified: Secondary | ICD-10-CM | POA: Diagnosis not present

## 2020-08-15 ENCOUNTER — Telehealth: Payer: Self-pay

## 2020-08-15 ENCOUNTER — Ambulatory Visit: Payer: Self-pay | Admitting: Orthopedic Surgery

## 2020-08-15 NOTE — Telephone Encounter (Signed)
Patients god mom called regarding her sons appointment for the pin hole in his right foot she stated chris at [facility] looked at  His foot and the wound is black around the edges, god mom is requesting antibiotic for the wound and is requesting a call back regarding wound care call back:[phone]

## 2020-08-15 NOTE — Telephone Encounter (Signed)
Called and advised that we need to see the pt in the office to eval. States that the pt does not have transportation. Resch for tomorrow.

## 2020-08-16 ENCOUNTER — Ambulatory Visit (INDEPENDENT_AMBULATORY_CARE_PROVIDER_SITE_OTHER): Payer: Self-pay | Admitting: Orthopedic Surgery

## 2020-08-16 DIAGNOSIS — Z89431 Acquired absence of right foot: Secondary | ICD-10-CM

## 2020-08-16 DIAGNOSIS — L97411 Non-pressure chronic ulcer of right heel and midfoot limited to breakdown of skin: Secondary | ICD-10-CM

## 2020-08-16 DIAGNOSIS — Z89512 Acquired absence of left leg below knee: Secondary | ICD-10-CM

## 2020-08-17 ENCOUNTER — Encounter: Payer: Self-pay | Admitting: Orthopedic Surgery
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
## 2020-08-31 ENCOUNTER — Other Ambulatory Visit: Payer: Self-pay

## 2020-08-31 DIAGNOSIS — Z89512 Acquired absence of left leg below knee: Secondary | ICD-10-CM

## 2020-09-01 DIAGNOSIS — S98012S Complete traumatic amputation of left foot at ankle level, sequela: Secondary | ICD-10-CM | POA: Diagnosis not present

## 2020-09-01 DIAGNOSIS — E119 Type 2 diabetes mellitus without complications: Secondary | ICD-10-CM | POA: Diagnosis not present

## 2020-09-01 DIAGNOSIS — Z6841 Body Mass Index (BMI) 40.0 and over, adult: Secondary | ICD-10-CM | POA: Diagnosis not present

## 2020-09-01 DIAGNOSIS — I10 Essential (primary) hypertension: Secondary | ICD-10-CM | POA: Diagnosis not present

## 2020-09-06 DIAGNOSIS — Z89511 Acquired absence of right leg below knee: Secondary | ICD-10-CM | POA: Diagnosis not present

## 2020-09-07 ENCOUNTER — Other Ambulatory Visit: Payer: Self-pay

## 2020-09-07 ENCOUNTER — Ambulatory Visit: Payer: Medicaid Other | Attending: Orthopedic Surgery

## 2020-09-07 DIAGNOSIS — M6281 Muscle weakness (generalized): Secondary | ICD-10-CM | POA: Diagnosis not present

## 2020-09-07 DIAGNOSIS — R2681 Unsteadiness on feet: Secondary | ICD-10-CM | POA: Diagnosis not present

## 2020-09-07 DIAGNOSIS — R2689 Other abnormalities of gait and mobility: Secondary | ICD-10-CM | POA: Diagnosis not present

## 2020-09-07 DIAGNOSIS — R293 Abnormal posture: Secondary | ICD-10-CM | POA: Diagnosis not present
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
## 2020-09-08 DIAGNOSIS — L97519 Non-pressure chronic ulcer of other part of right foot with unspecified severity: Secondary | ICD-10-CM | POA: Diagnosis not present

## 2020-09-08 NOTE — Addendum Note (Signed)
Addended by: Elmer Bales A on: 09/08/2020 10:56 AM   Modules accepted: Orders

## 2020-09-13 ENCOUNTER — Ambulatory Visit: Payer: Self-pay | Admitting: Orthopedic Surgery

## 2020-09-14 DIAGNOSIS — Z419 Encounter for procedure for purposes other than remedying health state, unspecified: Secondary | ICD-10-CM | POA: Diagnosis not present

## 2020-09-16 ENCOUNTER — Other Ambulatory Visit: Payer: Self-pay

## 2020-09-16 ENCOUNTER — Encounter: Payer: Self-pay | Admitting: Physical Therapy

## 2020-09-16 ENCOUNTER — Ambulatory Visit: Payer: Medicaid Other | Attending: Orthopedic Surgery | Admitting: Physical Therapy

## 2020-09-16 DIAGNOSIS — R2681 Unsteadiness on feet: Secondary | ICD-10-CM | POA: Insufficient documentation

## 2020-09-16 DIAGNOSIS — R293 Abnormal posture: Secondary | ICD-10-CM | POA: Diagnosis not present

## 2020-09-16 DIAGNOSIS — L97519 Non-pressure chronic ulcer of other part of right foot with unspecified severity: Secondary | ICD-10-CM | POA: Diagnosis not present

## 2020-09-16 DIAGNOSIS — R2689 Other abnormalities of gait and mobility: Secondary | ICD-10-CM | POA: Diagnosis not present

## 2020-09-16 DIAGNOSIS — M6281 Muscle weakness (generalized): Secondary | ICD-10-CM | POA: Diagnosis not present
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
## 2020-09-19 ENCOUNTER — Ambulatory Visit: Payer: Self-pay | Admitting: Orthopedic Surgery

## 2020-09-21 ENCOUNTER — Ambulatory Visit: Payer: Medicaid Other

## 2020-09-21 ENCOUNTER — Ambulatory Visit (HOSPITAL_BASED_OUTPATIENT_CLINIC_OR_DEPARTMENT_OTHER)
Admission: RE | Admit: 2020-09-21 | Discharge: 2020-09-21 | Disposition: A | Discharge status: Home / Self Care | Payer: Medicaid Other | Source: Ambulatory Visit | Attending: Foot & Ankle Surgery | Admitting: Foot & Ankle Surgery

## 2020-09-21 ENCOUNTER — Other Ambulatory Visit: Payer: Self-pay

## 2020-09-21 ENCOUNTER — Other Ambulatory Visit (HOSPITAL_BASED_OUTPATIENT_CLINIC_OR_DEPARTMENT_OTHER): Payer: Self-pay | Admitting: Foot & Ankle Surgery

## 2020-09-21 DIAGNOSIS — L97519 Non-pressure chronic ulcer of other part of right foot with unspecified severity: Secondary | ICD-10-CM | POA: Insufficient documentation

## 2020-09-21 DIAGNOSIS — R2689 Other abnormalities of gait and mobility: Secondary | ICD-10-CM

## 2020-09-21 DIAGNOSIS — R2681 Unsteadiness on feet: Secondary | ICD-10-CM | POA: Diagnosis not present

## 2020-09-21 DIAGNOSIS — R293 Abnormal posture: Secondary | ICD-10-CM | POA: Diagnosis not present

## 2020-09-21 DIAGNOSIS — M6281 Muscle weakness (generalized): Secondary | ICD-10-CM | POA: Diagnosis not present
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
## 2020-09-23 ENCOUNTER — Other Ambulatory Visit: Payer: Self-pay

## 2020-09-23 ENCOUNTER — Ambulatory Visit: Payer: Medicaid Other

## 2020-09-23 DIAGNOSIS — R293 Abnormal posture: Secondary | ICD-10-CM

## 2020-09-23 DIAGNOSIS — R2681 Unsteadiness on feet: Secondary | ICD-10-CM

## 2020-09-23 DIAGNOSIS — R2689 Other abnormalities of gait and mobility: Secondary | ICD-10-CM

## 2020-09-23 DIAGNOSIS — M6281 Muscle weakness (generalized): Secondary | ICD-10-CM

## 2020-09-23 DIAGNOSIS — L97519 Non-pressure chronic ulcer of other part of right foot with unspecified severity: Secondary | ICD-10-CM | POA: Diagnosis not present
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
## 2020-09-27 ENCOUNTER — Ambulatory Visit: Payer: Medicaid Other | Admitting: Physical Therapy

## 2020-09-27 ENCOUNTER — Encounter: Payer: Self-pay | Admitting: Physical Therapy

## 2020-09-27 ENCOUNTER — Other Ambulatory Visit: Payer: Self-pay

## 2020-09-27 DIAGNOSIS — L97519 Non-pressure chronic ulcer of other part of right foot with unspecified severity: Secondary | ICD-10-CM | POA: Diagnosis not present

## 2020-09-27 DIAGNOSIS — R2681 Unsteadiness on feet: Secondary | ICD-10-CM | POA: Diagnosis not present

## 2020-09-27 DIAGNOSIS — M6281 Muscle weakness (generalized): Secondary | ICD-10-CM | POA: Diagnosis not present

## 2020-09-27 DIAGNOSIS — R293 Abnormal posture: Secondary | ICD-10-CM | POA: Diagnosis not present

## 2020-09-27 DIAGNOSIS — R2689 Other abnormalities of gait and mobility: Secondary | ICD-10-CM
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
## 2020-09-28 DIAGNOSIS — E119 Type 2 diabetes mellitus without complications: Secondary | ICD-10-CM | POA: Diagnosis not present

## 2020-09-28 DIAGNOSIS — S98011A Complete traumatic amputation of right foot at ankle level, initial encounter: Secondary | ICD-10-CM | POA: Diagnosis not present

## 2020-09-28 DIAGNOSIS — Z89612 Acquired absence of left leg above knee: Secondary | ICD-10-CM | POA: Diagnosis not present

## 2020-09-28 DIAGNOSIS — L97511 Non-pressure chronic ulcer of other part of right foot limited to breakdown of skin: Secondary | ICD-10-CM | POA: Diagnosis not present

## 2020-09-29 ENCOUNTER — Ambulatory Visit: Payer: Medicaid Other | Admitting: Physical Therapy

## 2020-09-29 ENCOUNTER — Other Ambulatory Visit: Payer: Self-pay

## 2020-09-29 DIAGNOSIS — L97519 Non-pressure chronic ulcer of other part of right foot with unspecified severity: Secondary | ICD-10-CM | POA: Diagnosis not present

## 2020-09-29 DIAGNOSIS — M6281 Muscle weakness (generalized): Secondary | ICD-10-CM

## 2020-09-29 DIAGNOSIS — R2681 Unsteadiness on feet: Secondary | ICD-10-CM

## 2020-09-29 DIAGNOSIS — R293 Abnormal posture: Secondary | ICD-10-CM | POA: Diagnosis not present

## 2020-09-29 DIAGNOSIS — R2689 Other abnormalities of gait and mobility: Secondary | ICD-10-CM
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
## 2020-10-03 ENCOUNTER — Ambulatory Visit: Payer: Medicaid Other | Admitting: Physical Therapy

## 2020-10-06 ENCOUNTER — Encounter: Payer: Self-pay | Admitting: Physical Therapy

## 2020-10-06 ENCOUNTER — Other Ambulatory Visit: Payer: Self-pay

## 2020-10-06 ENCOUNTER — Ambulatory Visit: Payer: Medicaid Other | Admitting: Physical Therapy

## 2020-10-06 DIAGNOSIS — R293 Abnormal posture: Secondary | ICD-10-CM

## 2020-10-06 DIAGNOSIS — M6281 Muscle weakness (generalized): Secondary | ICD-10-CM

## 2020-10-06 DIAGNOSIS — L97519 Non-pressure chronic ulcer of other part of right foot with unspecified severity: Secondary | ICD-10-CM | POA: Diagnosis not present

## 2020-10-06 DIAGNOSIS — R2681 Unsteadiness on feet: Secondary | ICD-10-CM

## 2020-10-06 DIAGNOSIS — R2689 Other abnormalities of gait and mobility: Secondary | ICD-10-CM | POA: Diagnosis not present
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
## 2020-10-11 ENCOUNTER — Ambulatory Visit: Payer: Self-pay | Admitting: Physical Therapy

## 2020-10-13 ENCOUNTER — Ambulatory Visit: Payer: Self-pay | Admitting: Physical Therapy

## 2020-10-14 DIAGNOSIS — Z419 Encounter for procedure for purposes other than remedying health state, unspecified: Secondary | ICD-10-CM | POA: Diagnosis not present

## 2020-10-18 ENCOUNTER — Ambulatory Visit: Payer: Self-pay | Admitting: Physical Therapy

## 2020-10-25 ENCOUNTER — Ambulatory Visit: Payer: Medicaid Other | Attending: Orthopedic Surgery | Admitting: Physical Therapy

## 2020-10-25 ENCOUNTER — Telehealth: Payer: Self-pay | Admitting: Physical Therapy
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
## 2020-11-01 ENCOUNTER — Ambulatory Visit: Payer: Self-pay | Admitting: Physical Therapy

## 2020-11-10 ENCOUNTER — Ambulatory Visit: Payer: Medicaid Other

## 2020-11-14 DIAGNOSIS — Z419 Encounter for procedure for purposes other than remedying health state, unspecified: Secondary | ICD-10-CM | POA: Diagnosis not present

## 2020-11-17 ENCOUNTER — Ambulatory Visit: Payer: Medicaid Other | Attending: Orthopedic Surgery | Admitting: Physical Therapy

## 2020-12-15 DIAGNOSIS — Z419 Encounter for procedure for purposes other than remedying health state, unspecified: Secondary | ICD-10-CM | POA: Diagnosis not present

## 2021-01-04 DIAGNOSIS — M7989 Other specified soft tissue disorders: Secondary | ICD-10-CM | POA: Diagnosis not present

## 2021-01-04 DIAGNOSIS — Z89612 Acquired absence of left leg above knee: Secondary | ICD-10-CM | POA: Diagnosis not present

## 2021-01-04 DIAGNOSIS — E119 Type 2 diabetes mellitus without complications: Secondary | ICD-10-CM | POA: Diagnosis not present

## 2021-01-04 DIAGNOSIS — L03115 Cellulitis of right lower limb: Secondary | ICD-10-CM | POA: Diagnosis not present

## 2021-01-04 DIAGNOSIS — L97511 Non-pressure chronic ulcer of other part of right foot limited to breakdown of skin: Secondary | ICD-10-CM | POA: Diagnosis not present

## 2021-01-04 DIAGNOSIS — S98011A Complete traumatic amputation of right foot at ankle level, initial encounter: Secondary | ICD-10-CM | POA: Diagnosis not present

## 2021-01-05 DIAGNOSIS — E11621 Type 2 diabetes mellitus with foot ulcer: Secondary | ICD-10-CM | POA: Diagnosis not present

## 2021-01-05 DIAGNOSIS — L97511 Non-pressure chronic ulcer of other part of right foot limited to breakdown of skin: Secondary | ICD-10-CM | POA: Diagnosis not present

## 2021-01-05 DIAGNOSIS — Z89512 Acquired absence of left leg below knee: Secondary | ICD-10-CM | POA: Diagnosis not present

## 2021-01-05 DIAGNOSIS — Z89431 Acquired absence of right foot: Secondary | ICD-10-CM | POA: Diagnosis not present

## 2021-01-11 DIAGNOSIS — E1140 Type 2 diabetes mellitus with diabetic neuropathy, unspecified: Secondary | ICD-10-CM | POA: Diagnosis not present

## 2021-01-11 DIAGNOSIS — Z89431 Acquired absence of right foot: Secondary | ICD-10-CM | POA: Diagnosis not present

## 2021-01-14 DIAGNOSIS — Z419 Encounter for procedure for purposes other than remedying health state, unspecified: Secondary | ICD-10-CM | POA: Diagnosis not present

## 2021-02-01 DIAGNOSIS — E11621 Type 2 diabetes mellitus with foot ulcer: Secondary | ICD-10-CM | POA: Diagnosis not present

## 2021-02-01 DIAGNOSIS — E1151 Type 2 diabetes mellitus with diabetic peripheral angiopathy without gangrene: Secondary | ICD-10-CM | POA: Diagnosis not present

## 2021-02-01 DIAGNOSIS — E1122 Type 2 diabetes mellitus with diabetic chronic kidney disease: Secondary | ICD-10-CM | POA: Diagnosis not present

## 2021-02-01 DIAGNOSIS — Z89431 Acquired absence of right foot: Secondary | ICD-10-CM | POA: Diagnosis not present

## 2021-02-01 DIAGNOSIS — L97413 Non-pressure chronic ulcer of right heel and midfoot with necrosis of muscle: Secondary | ICD-10-CM | POA: Diagnosis not present

## 2021-02-01 DIAGNOSIS — E46 Unspecified protein-calorie malnutrition: Secondary | ICD-10-CM | POA: Diagnosis not present

## 2021-02-01 DIAGNOSIS — L97511 Non-pressure chronic ulcer of other part of right foot limited to breakdown of skin: Secondary | ICD-10-CM | POA: Diagnosis not present

## 2021-02-01 DIAGNOSIS — E1136 Type 2 diabetes mellitus with diabetic cataract: Secondary | ICD-10-CM | POA: Diagnosis not present

## 2021-02-01 DIAGNOSIS — E1142 Type 2 diabetes mellitus with diabetic polyneuropathy: Secondary | ICD-10-CM | POA: Diagnosis not present

## 2021-02-01 DIAGNOSIS — Z89512 Acquired absence of left leg below knee: Secondary | ICD-10-CM | POA: Diagnosis not present

## 2021-02-02 DIAGNOSIS — M25374 Other instability, right foot: Secondary | ICD-10-CM | POA: Diagnosis not present

## 2021-02-03 DIAGNOSIS — E1142 Type 2 diabetes mellitus with diabetic polyneuropathy: Secondary | ICD-10-CM | POA: Diagnosis not present

## 2021-02-03 DIAGNOSIS — L97511 Non-pressure chronic ulcer of other part of right foot limited to breakdown of skin: Secondary | ICD-10-CM | POA: Diagnosis not present

## 2021-02-03 DIAGNOSIS — L97413 Non-pressure chronic ulcer of right heel and midfoot with necrosis of muscle: Secondary | ICD-10-CM | POA: Diagnosis not present

## 2021-02-07 DIAGNOSIS — L97413 Non-pressure chronic ulcer of right heel and midfoot with necrosis of muscle: Secondary | ICD-10-CM | POA: Diagnosis not present

## 2021-02-07 DIAGNOSIS — E1142 Type 2 diabetes mellitus with diabetic polyneuropathy: Secondary | ICD-10-CM | POA: Diagnosis not present

## 2021-02-07 DIAGNOSIS — Z89421 Acquired absence of other right toe(s): Secondary | ICD-10-CM | POA: Diagnosis not present

## 2021-02-07 DIAGNOSIS — E46 Unspecified protein-calorie malnutrition: Secondary | ICD-10-CM | POA: Diagnosis not present

## 2021-02-07 DIAGNOSIS — E11621 Type 2 diabetes mellitus with foot ulcer: Secondary | ICD-10-CM | POA: Diagnosis not present

## 2021-02-07 DIAGNOSIS — Z89512 Acquired absence of left leg below knee: Secondary | ICD-10-CM | POA: Diagnosis not present

## 2021-02-07 DIAGNOSIS — Z89411 Acquired absence of right great toe: Secondary | ICD-10-CM | POA: Diagnosis not present

## 2021-02-08 DIAGNOSIS — E11621 Type 2 diabetes mellitus with foot ulcer: Secondary | ICD-10-CM | POA: Diagnosis not present

## 2021-02-08 DIAGNOSIS — L97413 Non-pressure chronic ulcer of right heel and midfoot with necrosis of muscle: Secondary | ICD-10-CM | POA: Diagnosis not present

## 2021-02-08 DIAGNOSIS — E1142 Type 2 diabetes mellitus with diabetic polyneuropathy: Secondary | ICD-10-CM | POA: Diagnosis not present

## 2021-02-08 DIAGNOSIS — Z89431 Acquired absence of right foot: Secondary | ICD-10-CM | POA: Diagnosis not present

## 2021-02-08 DIAGNOSIS — Z89512 Acquired absence of left leg below knee: Secondary | ICD-10-CM | POA: Diagnosis not present

## 2021-02-08 DIAGNOSIS — L97212 Non-pressure chronic ulcer of right calf with fat layer exposed: Secondary | ICD-10-CM | POA: Diagnosis not present

## 2021-02-08 DIAGNOSIS — L97511 Non-pressure chronic ulcer of other part of right foot limited to breakdown of skin: Secondary | ICD-10-CM | POA: Diagnosis not present

## 2021-02-08 DIAGNOSIS — I872 Venous insufficiency (chronic) (peripheral): Secondary | ICD-10-CM | POA: Diagnosis not present

## 2021-02-10 DIAGNOSIS — E1142 Type 2 diabetes mellitus with diabetic polyneuropathy: Secondary | ICD-10-CM | POA: Diagnosis not present

## 2021-02-10 DIAGNOSIS — L97413 Non-pressure chronic ulcer of right heel and midfoot with necrosis of muscle: Secondary | ICD-10-CM | POA: Diagnosis not present

## 2021-02-10 DIAGNOSIS — L97511 Non-pressure chronic ulcer of other part of right foot limited to breakdown of skin: Secondary | ICD-10-CM | POA: Diagnosis not present

## 2021-02-10 DIAGNOSIS — I872 Venous insufficiency (chronic) (peripheral): Secondary | ICD-10-CM | POA: Diagnosis not present

## 2021-02-13 DIAGNOSIS — I872 Venous insufficiency (chronic) (peripheral): Secondary | ICD-10-CM | POA: Diagnosis not present

## 2021-02-13 DIAGNOSIS — E1142 Type 2 diabetes mellitus with diabetic polyneuropathy: Secondary | ICD-10-CM | POA: Diagnosis not present

## 2021-02-13 DIAGNOSIS — L089 Local infection of the skin and subcutaneous tissue, unspecified: Secondary | ICD-10-CM | POA: Diagnosis not present

## 2021-02-13 DIAGNOSIS — E11628 Type 2 diabetes mellitus with other skin complications: Secondary | ICD-10-CM | POA: Diagnosis not present

## 2021-02-13 DIAGNOSIS — Z89431 Acquired absence of right foot: Secondary | ICD-10-CM | POA: Diagnosis not present

## 2021-02-13 DIAGNOSIS — L97413 Non-pressure chronic ulcer of right heel and midfoot with necrosis of muscle: Secondary | ICD-10-CM | POA: Diagnosis not present

## 2021-02-14 DIAGNOSIS — Z419 Encounter for procedure for purposes other than remedying health state, unspecified: Secondary | ICD-10-CM | POA: Diagnosis not present

## 2021-02-15 DIAGNOSIS — E11621 Type 2 diabetes mellitus with foot ulcer: Secondary | ICD-10-CM | POA: Diagnosis not present

## 2021-02-15 DIAGNOSIS — Z89431 Acquired absence of right foot: Secondary | ICD-10-CM | POA: Diagnosis not present

## 2021-02-15 DIAGNOSIS — L97413 Non-pressure chronic ulcer of right heel and midfoot with necrosis of muscle: Secondary | ICD-10-CM | POA: Diagnosis not present

## 2021-02-15 DIAGNOSIS — E1142 Type 2 diabetes mellitus with diabetic polyneuropathy: Secondary | ICD-10-CM | POA: Diagnosis not present

## 2021-02-15 DIAGNOSIS — Z89512 Acquired absence of left leg below knee: Secondary | ICD-10-CM | POA: Diagnosis not present

## 2021-02-22 DIAGNOSIS — I872 Venous insufficiency (chronic) (peripheral): Secondary | ICD-10-CM | POA: Diagnosis not present

## 2021-02-22 DIAGNOSIS — E11628 Type 2 diabetes mellitus with other skin complications: Secondary | ICD-10-CM | POA: Diagnosis not present

## 2021-02-22 DIAGNOSIS — L089 Local infection of the skin and subcutaneous tissue, unspecified: Secondary | ICD-10-CM | POA: Diagnosis not present

## 2021-02-22 DIAGNOSIS — E1142 Type 2 diabetes mellitus with diabetic polyneuropathy: Secondary | ICD-10-CM | POA: Diagnosis not present

## 2021-02-22 DIAGNOSIS — L97413 Non-pressure chronic ulcer of right heel and midfoot with necrosis of muscle: Secondary | ICD-10-CM | POA: Diagnosis not present

## 2021-02-22 DIAGNOSIS — Z89431 Acquired absence of right foot: Secondary | ICD-10-CM | POA: Diagnosis not present

## 2021-02-22 DIAGNOSIS — E11621 Type 2 diabetes mellitus with foot ulcer: Secondary | ICD-10-CM | POA: Diagnosis not present

## 2021-03-01 DIAGNOSIS — Z89431 Acquired absence of right foot: Secondary | ICD-10-CM | POA: Diagnosis not present

## 2021-03-01 DIAGNOSIS — L97511 Non-pressure chronic ulcer of other part of right foot limited to breakdown of skin: Secondary | ICD-10-CM | POA: Diagnosis not present

## 2021-03-01 DIAGNOSIS — E11628 Type 2 diabetes mellitus with other skin complications: Secondary | ICD-10-CM | POA: Diagnosis not present

## 2021-03-01 DIAGNOSIS — E1142 Type 2 diabetes mellitus with diabetic polyneuropathy: Secondary | ICD-10-CM | POA: Diagnosis not present

## 2021-03-01 DIAGNOSIS — I872 Venous insufficiency (chronic) (peripheral): Secondary | ICD-10-CM | POA: Diagnosis not present

## 2021-03-01 DIAGNOSIS — E1122 Type 2 diabetes mellitus with diabetic chronic kidney disease: Secondary | ICD-10-CM | POA: Diagnosis not present

## 2021-03-01 DIAGNOSIS — L97413 Non-pressure chronic ulcer of right heel and midfoot with necrosis of muscle: Secondary | ICD-10-CM | POA: Diagnosis not present

## 2021-03-01 DIAGNOSIS — E1136 Type 2 diabetes mellitus with diabetic cataract: Secondary | ICD-10-CM | POA: Diagnosis not present

## 2021-03-01 DIAGNOSIS — Z89512 Acquired absence of left leg below knee: Secondary | ICD-10-CM | POA: Diagnosis not present

## 2021-03-01 DIAGNOSIS — E46 Unspecified protein-calorie malnutrition: Secondary | ICD-10-CM | POA: Diagnosis not present

## 2021-03-01 DIAGNOSIS — E1151 Type 2 diabetes mellitus with diabetic peripheral angiopathy without gangrene: Secondary | ICD-10-CM | POA: Diagnosis not present

## 2021-03-01 DIAGNOSIS — E11621 Type 2 diabetes mellitus with foot ulcer: Secondary | ICD-10-CM | POA: Diagnosis not present

## 2021-03-01 DIAGNOSIS — L089 Local infection of the skin and subcutaneous tissue, unspecified: Secondary | ICD-10-CM | POA: Diagnosis not present

## 2021-03-03 DIAGNOSIS — I872 Venous insufficiency (chronic) (peripheral): Secondary | ICD-10-CM | POA: Diagnosis not present

## 2021-03-03 DIAGNOSIS — E1142 Type 2 diabetes mellitus with diabetic polyneuropathy: Secondary | ICD-10-CM | POA: Diagnosis not present

## 2021-03-03 DIAGNOSIS — L97519 Non-pressure chronic ulcer of other part of right foot with unspecified severity: Secondary | ICD-10-CM | POA: Diagnosis not present

## 2021-03-03 DIAGNOSIS — L97413 Non-pressure chronic ulcer of right heel and midfoot with necrosis of muscle: Secondary | ICD-10-CM | POA: Diagnosis not present

## 2021-03-03 DIAGNOSIS — E11621 Type 2 diabetes mellitus with foot ulcer: Secondary | ICD-10-CM | POA: Diagnosis not present

## 2021-03-03 DIAGNOSIS — Z89431 Acquired absence of right foot: Secondary | ICD-10-CM | POA: Diagnosis not present

## 2021-03-08 DIAGNOSIS — L97413 Non-pressure chronic ulcer of right heel and midfoot with necrosis of muscle: Secondary | ICD-10-CM | POA: Diagnosis not present

## 2021-03-08 DIAGNOSIS — Z89512 Acquired absence of left leg below knee: Secondary | ICD-10-CM | POA: Diagnosis not present

## 2021-03-08 DIAGNOSIS — E11621 Type 2 diabetes mellitus with foot ulcer: Secondary | ICD-10-CM | POA: Diagnosis not present

## 2021-03-08 DIAGNOSIS — E46 Unspecified protein-calorie malnutrition: Secondary | ICD-10-CM | POA: Diagnosis not present

## 2021-03-08 DIAGNOSIS — E1142 Type 2 diabetes mellitus with diabetic polyneuropathy: Secondary | ICD-10-CM | POA: Diagnosis not present

## 2021-03-08 DIAGNOSIS — Z89429 Acquired absence of other toe(s), unspecified side: Secondary | ICD-10-CM | POA: Diagnosis not present

## 2021-03-08 DIAGNOSIS — Z6839 Body mass index (BMI) 39.0-39.9, adult: Secondary | ICD-10-CM | POA: Diagnosis not present

## 2021-03-08 DIAGNOSIS — I872 Venous insufficiency (chronic) (peripheral): Secondary | ICD-10-CM | POA: Diagnosis not present

## 2021-03-13 DIAGNOSIS — Z89431 Acquired absence of right foot: Secondary | ICD-10-CM | POA: Diagnosis not present

## 2021-03-15 DIAGNOSIS — I872 Venous insufficiency (chronic) (peripheral): Secondary | ICD-10-CM | POA: Diagnosis not present

## 2021-03-15 DIAGNOSIS — E1142 Type 2 diabetes mellitus with diabetic polyneuropathy: Secondary | ICD-10-CM | POA: Diagnosis not present

## 2021-03-15 DIAGNOSIS — Z6841 Body Mass Index (BMI) 40.0 and over, adult: Secondary | ICD-10-CM | POA: Diagnosis not present

## 2021-03-15 DIAGNOSIS — E119 Type 2 diabetes mellitus without complications: Secondary | ICD-10-CM | POA: Diagnosis not present

## 2021-03-15 DIAGNOSIS — Z89431 Acquired absence of right foot: Secondary | ICD-10-CM | POA: Diagnosis not present

## 2021-03-15 DIAGNOSIS — Z0000 Encounter for general adult medical examination without abnormal findings: Secondary | ICD-10-CM | POA: Diagnosis not present

## 2021-03-15 DIAGNOSIS — E559 Vitamin D deficiency, unspecified: Secondary | ICD-10-CM | POA: Diagnosis not present

## 2021-03-15 DIAGNOSIS — L97413 Non-pressure chronic ulcer of right heel and midfoot with necrosis of muscle: Secondary | ICD-10-CM | POA: Diagnosis not present

## 2021-03-15 DIAGNOSIS — R5383 Other fatigue: Secondary | ICD-10-CM | POA: Diagnosis not present

## 2021-03-15 DIAGNOSIS — Z20822 Contact with and (suspected) exposure to covid-19: Secondary | ICD-10-CM | POA: Diagnosis not present

## 2021-03-15 DIAGNOSIS — E11621 Type 2 diabetes mellitus with foot ulcer: Secondary | ICD-10-CM | POA: Diagnosis not present

## 2021-03-16 DIAGNOSIS — Z419 Encounter for procedure for purposes other than remedying health state, unspecified: Secondary | ICD-10-CM | POA: Diagnosis not present

## 2021-03-22 DIAGNOSIS — E11621 Type 2 diabetes mellitus with foot ulcer: Secondary | ICD-10-CM | POA: Diagnosis not present

## 2021-03-22 DIAGNOSIS — L97513 Non-pressure chronic ulcer of other part of right foot with necrosis of muscle: Secondary | ICD-10-CM | POA: Diagnosis not present

## 2021-03-22 DIAGNOSIS — L97413 Non-pressure chronic ulcer of right heel and midfoot with necrosis of muscle: Secondary | ICD-10-CM | POA: Diagnosis not present

## 2021-03-22 DIAGNOSIS — E46 Unspecified protein-calorie malnutrition: Secondary | ICD-10-CM | POA: Diagnosis not present

## 2021-03-22 DIAGNOSIS — Z89431 Acquired absence of right foot: Secondary | ICD-10-CM | POA: Diagnosis not present

## 2021-03-22 DIAGNOSIS — Z89512 Acquired absence of left leg below knee: Secondary | ICD-10-CM | POA: Diagnosis not present

## 2021-03-29 DIAGNOSIS — L089 Local infection of the skin and subcutaneous tissue, unspecified: Secondary | ICD-10-CM | POA: Diagnosis not present

## 2021-03-29 DIAGNOSIS — E11621 Type 2 diabetes mellitus with foot ulcer: Secondary | ICD-10-CM | POA: Diagnosis not present

## 2021-03-29 DIAGNOSIS — E1136 Type 2 diabetes mellitus with diabetic cataract: Secondary | ICD-10-CM | POA: Diagnosis not present

## 2021-03-29 DIAGNOSIS — E11628 Type 2 diabetes mellitus with other skin complications: Secondary | ICD-10-CM | POA: Diagnosis not present

## 2021-03-29 DIAGNOSIS — L97511 Non-pressure chronic ulcer of other part of right foot limited to breakdown of skin: Secondary | ICD-10-CM | POA: Diagnosis not present

## 2021-03-29 DIAGNOSIS — E1122 Type 2 diabetes mellitus with diabetic chronic kidney disease: Secondary | ICD-10-CM | POA: Diagnosis not present

## 2021-03-29 DIAGNOSIS — L97413 Non-pressure chronic ulcer of right heel and midfoot with necrosis of muscle: Secondary | ICD-10-CM | POA: Diagnosis not present

## 2021-03-29 DIAGNOSIS — I872 Venous insufficiency (chronic) (peripheral): Secondary | ICD-10-CM | POA: Diagnosis not present

## 2021-03-29 DIAGNOSIS — E1142 Type 2 diabetes mellitus with diabetic polyneuropathy: Secondary | ICD-10-CM | POA: Diagnosis not present

## 2021-03-29 DIAGNOSIS — Z89512 Acquired absence of left leg below knee: Secondary | ICD-10-CM | POA: Diagnosis not present

## 2021-03-29 DIAGNOSIS — E46 Unspecified protein-calorie malnutrition: Secondary | ICD-10-CM | POA: Diagnosis not present

## 2021-03-29 DIAGNOSIS — Z89431 Acquired absence of right foot: Secondary | ICD-10-CM | POA: Diagnosis not present

## 2021-03-29 DIAGNOSIS — E1151 Type 2 diabetes mellitus with diabetic peripheral angiopathy without gangrene: Secondary | ICD-10-CM | POA: Diagnosis not present

## 2021-04-05 DIAGNOSIS — E11621 Type 2 diabetes mellitus with foot ulcer: Secondary | ICD-10-CM | POA: Diagnosis not present

## 2021-04-05 DIAGNOSIS — E08621 Diabetes mellitus due to underlying condition with foot ulcer: Secondary | ICD-10-CM | POA: Diagnosis not present

## 2021-04-05 DIAGNOSIS — I872 Venous insufficiency (chronic) (peripheral): Secondary | ICD-10-CM | POA: Diagnosis not present

## 2021-04-05 DIAGNOSIS — L97413 Non-pressure chronic ulcer of right heel and midfoot with necrosis of muscle: Secondary | ICD-10-CM | POA: Diagnosis not present

## 2021-04-05 DIAGNOSIS — E1142 Type 2 diabetes mellitus with diabetic polyneuropathy: Secondary | ICD-10-CM | POA: Diagnosis not present

## 2021-04-12 DIAGNOSIS — Z89431 Acquired absence of right foot: Secondary | ICD-10-CM | POA: Diagnosis not present

## 2021-04-16 DIAGNOSIS — Z419 Encounter for procedure for purposes other than remedying health state, unspecified: Secondary | ICD-10-CM | POA: Diagnosis not present

## 2021-05-08 DIAGNOSIS — E119 Type 2 diabetes mellitus without complications: Secondary | ICD-10-CM | POA: Diagnosis not present

## 2021-05-08 DIAGNOSIS — E11621 Type 2 diabetes mellitus with foot ulcer: Secondary | ICD-10-CM | POA: Diagnosis not present

## 2021-05-08 DIAGNOSIS — Z6841 Body Mass Index (BMI) 40.0 and over, adult: Secondary | ICD-10-CM | POA: Diagnosis not present

## 2021-05-08 DIAGNOSIS — L97413 Non-pressure chronic ulcer of right heel and midfoot with necrosis of muscle: Secondary | ICD-10-CM | POA: Diagnosis not present

## 2021-05-13 DIAGNOSIS — Z89431 Acquired absence of right foot: Secondary | ICD-10-CM | POA: Diagnosis not present

## 2021-05-17 DIAGNOSIS — Z419 Encounter for procedure for purposes other than remedying health state, unspecified: Secondary | ICD-10-CM | POA: Diagnosis not present

## 2021-06-13 DIAGNOSIS — L97413 Non-pressure chronic ulcer of right heel and midfoot with necrosis of muscle: Secondary | ICD-10-CM | POA: Diagnosis not present

## 2021-06-14 DIAGNOSIS — Z419 Encounter for procedure for purposes other than remedying health state, unspecified: Secondary | ICD-10-CM | POA: Diagnosis not present

## 2021-07-11 DIAGNOSIS — L97413 Non-pressure chronic ulcer of right heel and midfoot with necrosis of muscle: Secondary | ICD-10-CM | POA: Diagnosis not present

## 2021-07-12 DIAGNOSIS — S98911S Complete traumatic amputation of right foot, level unspecified, sequela: Secondary | ICD-10-CM | POA: Diagnosis not present

## 2021-07-12 DIAGNOSIS — Z6841 Body Mass Index (BMI) 40.0 and over, adult: Secondary | ICD-10-CM | POA: Diagnosis not present

## 2021-07-12 DIAGNOSIS — E119 Type 2 diabetes mellitus without complications: Secondary | ICD-10-CM | POA: Diagnosis not present

## 2021-07-12 DIAGNOSIS — I10 Essential (primary) hypertension: Secondary | ICD-10-CM | POA: Diagnosis not present

## 2021-07-12 DIAGNOSIS — R2681 Unsteadiness on feet: Secondary | ICD-10-CM | POA: Diagnosis not present

## 2021-07-12 DIAGNOSIS — S98012S Complete traumatic amputation of left foot at ankle level, sequela: Secondary | ICD-10-CM | POA: Diagnosis not present

## 2021-07-15 DIAGNOSIS — Z419 Encounter for procedure for purposes other than remedying health state, unspecified: Secondary | ICD-10-CM | POA: Diagnosis not present

## 2021-07-18 ENCOUNTER — Ambulatory Visit: Payer: Medicaid Other | Attending: Physician Assistant

## 2021-07-18 ENCOUNTER — Ambulatory Visit: Payer: Medicaid Other | Admitting: Rehabilitation

## 2021-07-18 DIAGNOSIS — R2681 Unsteadiness on feet: Secondary | ICD-10-CM | POA: Diagnosis not present

## 2021-07-18 DIAGNOSIS — R293 Abnormal posture: Secondary | ICD-10-CM | POA: Insufficient documentation

## 2021-07-18 DIAGNOSIS — R2689 Other abnormalities of gait and mobility: Secondary | ICD-10-CM | POA: Diagnosis not present

## 2021-07-18 DIAGNOSIS — M6281 Muscle weakness (generalized): Secondary | ICD-10-CM | POA: Diagnosis not present
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
## 2021-08-02 ENCOUNTER — Ambulatory Visit: Payer: Medicaid Other

## 2021-08-02 DIAGNOSIS — M6281 Muscle weakness (generalized): Secondary | ICD-10-CM | POA: Diagnosis not present

## 2021-08-02 DIAGNOSIS — R2689 Other abnormalities of gait and mobility: Secondary | ICD-10-CM | POA: Diagnosis not present

## 2021-08-02 DIAGNOSIS — R293 Abnormal posture: Secondary | ICD-10-CM | POA: Diagnosis not present

## 2021-08-02 DIAGNOSIS — R2681 Unsteadiness on feet: Secondary | ICD-10-CM
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
## 2021-08-07 DIAGNOSIS — Z89512 Acquired absence of left leg below knee: Secondary | ICD-10-CM | POA: Diagnosis not present

## 2021-08-08 DIAGNOSIS — Z89512 Acquired absence of left leg below knee: Secondary | ICD-10-CM | POA: Diagnosis not present

## 2021-08-09 ENCOUNTER — Ambulatory Visit: Payer: Medicaid Other | Admitting: Rehabilitation

## 2021-08-09 DIAGNOSIS — Z89512 Acquired absence of left leg below knee: Secondary | ICD-10-CM | POA: Diagnosis not present

## 2021-08-10 DIAGNOSIS — Z89512 Acquired absence of left leg below knee: Secondary | ICD-10-CM | POA: Diagnosis not present

## 2021-08-11 ENCOUNTER — Ambulatory Visit: Payer: Medicaid Other | Admitting: Physical Therapy

## 2021-08-11 ENCOUNTER — Encounter: Payer: Self-pay | Admitting: Physical Therapy

## 2021-08-11 DIAGNOSIS — L97413 Non-pressure chronic ulcer of right heel and midfoot with necrosis of muscle: Secondary | ICD-10-CM | POA: Diagnosis not present

## 2021-08-11 DIAGNOSIS — M6281 Muscle weakness (generalized): Secondary | ICD-10-CM | POA: Diagnosis not present

## 2021-08-11 DIAGNOSIS — R293 Abnormal posture: Secondary | ICD-10-CM

## 2021-08-11 DIAGNOSIS — R2689 Other abnormalities of gait and mobility: Secondary | ICD-10-CM | POA: Diagnosis not present

## 2021-08-11 DIAGNOSIS — R2681 Unsteadiness on feet: Secondary | ICD-10-CM
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
## 2021-08-14 DIAGNOSIS — Z419 Encounter for procedure for purposes other than remedying health state, unspecified: Secondary | ICD-10-CM | POA: Diagnosis not present

## 2021-08-14 DIAGNOSIS — Z89512 Acquired absence of left leg below knee: Secondary | ICD-10-CM | POA: Diagnosis not present

## 2021-08-15 ENCOUNTER — Encounter: Payer: Medicaid Other | Admitting: Physical Therapy

## 2021-08-16 DIAGNOSIS — Z89512 Acquired absence of left leg below knee: Secondary | ICD-10-CM | POA: Diagnosis not present

## 2021-08-17 ENCOUNTER — Ambulatory Visit: Payer: Medicaid Other | Attending: Physician Assistant | Admitting: Physical Therapy

## 2021-08-17 ENCOUNTER — Encounter: Payer: Self-pay | Admitting: Physical Therapy

## 2021-08-17 DIAGNOSIS — R2689 Other abnormalities of gait and mobility: Secondary | ICD-10-CM | POA: Diagnosis not present

## 2021-08-17 DIAGNOSIS — R293 Abnormal posture: Secondary | ICD-10-CM | POA: Diagnosis not present

## 2021-08-17 DIAGNOSIS — R2681 Unsteadiness on feet: Secondary | ICD-10-CM | POA: Diagnosis not present

## 2021-08-17 DIAGNOSIS — M6281 Muscle weakness (generalized): Secondary | ICD-10-CM | POA: Insufficient documentation

## 2021-08-17 DIAGNOSIS — Z89512 Acquired absence of left leg below knee: Secondary | ICD-10-CM | POA: Diagnosis not present
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
## 2021-08-18 ENCOUNTER — Encounter: Payer: Self-pay | Admitting: Family

## 2021-08-18 ENCOUNTER — Ambulatory Visit (INDEPENDENT_AMBULATORY_CARE_PROVIDER_SITE_OTHER): Payer: Medicaid Other | Admitting: Family

## 2021-08-18 DIAGNOSIS — Z89512 Acquired absence of left leg below knee: Secondary | ICD-10-CM
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
## 2021-08-21 DIAGNOSIS — Z89512 Acquired absence of left leg below knee: Secondary | ICD-10-CM | POA: Diagnosis not present

## 2021-08-22 ENCOUNTER — Encounter: Payer: Self-pay | Admitting: Rehabilitation

## 2021-08-22 ENCOUNTER — Ambulatory Visit: Payer: Medicaid Other | Admitting: Rehabilitation

## 2021-08-22 DIAGNOSIS — R2689 Other abnormalities of gait and mobility: Secondary | ICD-10-CM

## 2021-08-22 DIAGNOSIS — M6281 Muscle weakness (generalized): Secondary | ICD-10-CM | POA: Diagnosis not present

## 2021-08-22 DIAGNOSIS — Z89512 Acquired absence of left leg below knee: Secondary | ICD-10-CM | POA: Diagnosis not present

## 2021-08-22 DIAGNOSIS — R293 Abnormal posture: Secondary | ICD-10-CM

## 2021-08-22 DIAGNOSIS — R2681 Unsteadiness on feet: Secondary | ICD-10-CM | POA: Diagnosis not present
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
## 2021-08-23 DIAGNOSIS — Z89512 Acquired absence of left leg below knee: Secondary | ICD-10-CM | POA: Diagnosis not present

## 2021-08-24 DIAGNOSIS — Z89512 Acquired absence of left leg below knee: Secondary | ICD-10-CM | POA: Diagnosis not present

## 2021-08-25 ENCOUNTER — Encounter: Payer: Medicaid Other | Admitting: Physical Therapy

## 2021-08-25 DIAGNOSIS — Z89512 Acquired absence of left leg below knee: Secondary | ICD-10-CM | POA: Diagnosis not present

## 2021-08-28 DIAGNOSIS — Z89512 Acquired absence of left leg below knee: Secondary | ICD-10-CM | POA: Diagnosis not present

## 2021-08-29 DIAGNOSIS — H26492 Other secondary cataract, left eye: Secondary | ICD-10-CM | POA: Diagnosis not present

## 2021-08-29 DIAGNOSIS — Z89512 Acquired absence of left leg below knee: Secondary | ICD-10-CM | POA: Diagnosis not present

## 2021-08-30 ENCOUNTER — Ambulatory Visit: Payer: Medicaid Other | Admitting: Rehabilitation

## 2021-08-30 DIAGNOSIS — Z89512 Acquired absence of left leg below knee: Secondary | ICD-10-CM | POA: Diagnosis not present

## 2021-08-31 DIAGNOSIS — Z89512 Acquired absence of left leg below knee: Secondary | ICD-10-CM | POA: Diagnosis not present

## 2021-09-02 DIAGNOSIS — Z89512 Acquired absence of left leg below knee: Secondary | ICD-10-CM | POA: Diagnosis not present

## 2021-09-04 DIAGNOSIS — Z89512 Acquired absence of left leg below knee: Secondary | ICD-10-CM | POA: Diagnosis not present

## 2021-09-05 DIAGNOSIS — Z89512 Acquired absence of left leg below knee: Secondary | ICD-10-CM | POA: Diagnosis not present

## 2021-09-06 ENCOUNTER — Ambulatory Visit: Payer: Medicaid Other | Admitting: Rehabilitation

## 2021-09-06 ENCOUNTER — Encounter: Payer: Self-pay | Admitting: Rehabilitation

## 2021-09-06 DIAGNOSIS — Z89512 Acquired absence of left leg below knee: Secondary | ICD-10-CM | POA: Diagnosis not present

## 2021-09-06 DIAGNOSIS — R293 Abnormal posture: Secondary | ICD-10-CM | POA: Diagnosis not present

## 2021-09-06 DIAGNOSIS — R2689 Other abnormalities of gait and mobility: Secondary | ICD-10-CM

## 2021-09-06 DIAGNOSIS — M6281 Muscle weakness (generalized): Secondary | ICD-10-CM

## 2021-09-06 DIAGNOSIS — R2681 Unsteadiness on feet: Secondary | ICD-10-CM | POA: Diagnosis not present
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
## 2021-09-07 DIAGNOSIS — Z89512 Acquired absence of left leg below knee: Secondary | ICD-10-CM | POA: Diagnosis not present

## 2021-09-08 DIAGNOSIS — Z89512 Acquired absence of left leg below knee: Secondary | ICD-10-CM | POA: Diagnosis not present

## 2021-09-10 DIAGNOSIS — L97413 Non-pressure chronic ulcer of right heel and midfoot with necrosis of muscle: Secondary | ICD-10-CM | POA: Diagnosis not present

## 2021-09-11 DIAGNOSIS — Z89512 Acquired absence of left leg below knee: Secondary | ICD-10-CM | POA: Diagnosis not present

## 2021-09-12 DIAGNOSIS — Z89512 Acquired absence of left leg below knee: Secondary | ICD-10-CM | POA: Diagnosis not present

## 2021-09-13 ENCOUNTER — Encounter: Payer: Medicaid Other | Admitting: Rehabilitation

## 2021-09-13 DIAGNOSIS — Z89512 Acquired absence of left leg below knee: Secondary | ICD-10-CM | POA: Diagnosis not present

## 2021-09-14 DIAGNOSIS — Z89512 Acquired absence of left leg below knee: Secondary | ICD-10-CM | POA: Diagnosis not present

## 2021-09-14 DIAGNOSIS — Z419 Encounter for procedure for purposes other than remedying health state, unspecified: Secondary | ICD-10-CM | POA: Diagnosis not present

## 2021-09-15 DIAGNOSIS — Z89512 Acquired absence of left leg below knee: Secondary | ICD-10-CM | POA: Diagnosis not present

## 2021-09-18 DIAGNOSIS — Z89512 Acquired absence of left leg below knee: Secondary | ICD-10-CM | POA: Diagnosis not present

## 2021-09-19 DIAGNOSIS — Z89512 Acquired absence of left leg below knee: Secondary | ICD-10-CM | POA: Diagnosis not present

## 2021-09-20 ENCOUNTER — Encounter: Payer: Medicaid Other | Admitting: Rehabilitation

## 2021-09-20 DIAGNOSIS — Z9889 Other specified postprocedural states: Secondary | ICD-10-CM | POA: Diagnosis not present

## 2021-09-20 DIAGNOSIS — Z961 Presence of intraocular lens: Secondary | ICD-10-CM | POA: Diagnosis not present

## 2021-09-20 DIAGNOSIS — E113313 Type 2 diabetes mellitus with moderate nonproliferative diabetic retinopathy with macular edema, bilateral: Secondary | ICD-10-CM | POA: Diagnosis not present

## 2021-09-20 DIAGNOSIS — Z89512 Acquired absence of left leg below knee: Secondary | ICD-10-CM | POA: Diagnosis not present

## 2021-09-20 DIAGNOSIS — Z7984 Long term (current) use of oral hypoglycemic drugs: Secondary | ICD-10-CM | POA: Diagnosis not present

## 2021-09-21 DIAGNOSIS — Z89512 Acquired absence of left leg below knee: Secondary | ICD-10-CM | POA: Diagnosis not present

## 2021-09-22 DIAGNOSIS — Z89512 Acquired absence of left leg below knee: Secondary | ICD-10-CM | POA: Diagnosis not present

## 2021-09-25 DIAGNOSIS — Z89512 Acquired absence of left leg below knee: Secondary | ICD-10-CM | POA: Diagnosis not present

## 2021-09-26 DIAGNOSIS — Z89512 Acquired absence of left leg below knee: Secondary | ICD-10-CM | POA: Diagnosis not present

## 2021-09-27 ENCOUNTER — Telehealth: Payer: Self-pay | Admitting: Family

## 2021-09-27 DIAGNOSIS — Z89512 Acquired absence of left leg below knee: Secondary | ICD-10-CM | POA: Diagnosis not present

## 2021-09-27 NOTE — Telephone Encounter (Signed)
Received vm from pts mother. IC,lmvm [phone]

## 2021-09-28 DIAGNOSIS — Z89512 Acquired absence of left leg below knee: Secondary | ICD-10-CM | POA: Diagnosis not present

## 2021-09-29 DIAGNOSIS — Z89512 Acquired absence of left leg below knee: Secondary | ICD-10-CM | POA: Diagnosis not present

## 2021-10-02 DIAGNOSIS — Z6841 Body Mass Index (BMI) 40.0 and over, adult: Secondary | ICD-10-CM | POA: Diagnosis not present

## 2021-10-02 DIAGNOSIS — E1165 Type 2 diabetes mellitus with hyperglycemia: Secondary | ICD-10-CM | POA: Diagnosis not present

## 2021-10-02 DIAGNOSIS — I10 Essential (primary) hypertension: Secondary | ICD-10-CM | POA: Diagnosis not present

## 2021-10-02 DIAGNOSIS — E119 Type 2 diabetes mellitus without complications: Secondary | ICD-10-CM | POA: Diagnosis not present

## 2021-10-02 DIAGNOSIS — E559 Vitamin D deficiency, unspecified: Secondary | ICD-10-CM | POA: Diagnosis not present

## 2021-10-02 DIAGNOSIS — R5383 Other fatigue: Secondary | ICD-10-CM | POA: Diagnosis not present

## 2021-10-02 DIAGNOSIS — Z79899 Other long term (current) drug therapy: Secondary | ICD-10-CM | POA: Diagnosis not present

## 2021-10-02 DIAGNOSIS — Z20822 Contact with and (suspected) exposure to covid-19: Secondary | ICD-10-CM | POA: Diagnosis not present

## 2021-10-02 DIAGNOSIS — E7800 Pure hypercholesterolemia, unspecified: Secondary | ICD-10-CM | POA: Diagnosis not present

## 2021-10-02 DIAGNOSIS — Z89512 Acquired absence of left leg below knee: Secondary | ICD-10-CM | POA: Diagnosis not present

## 2021-10-02 DIAGNOSIS — D539 Nutritional anemia, unspecified: Secondary | ICD-10-CM | POA: Diagnosis not present

## 2021-10-03 ENCOUNTER — Ambulatory Visit: Payer: Medicaid Other | Attending: Physician Assistant | Admitting: Rehabilitation

## 2021-10-03 DIAGNOSIS — M6281 Muscle weakness (generalized): Secondary | ICD-10-CM | POA: Insufficient documentation

## 2021-10-03 DIAGNOSIS — R293 Abnormal posture: Secondary | ICD-10-CM | POA: Insufficient documentation

## 2021-10-03 DIAGNOSIS — Z89512 Acquired absence of left leg below knee: Secondary | ICD-10-CM | POA: Diagnosis not present

## 2021-10-03 DIAGNOSIS — R2689 Other abnormalities of gait and mobility: Secondary | ICD-10-CM | POA: Insufficient documentation

## 2021-10-03 DIAGNOSIS — R2681 Unsteadiness on feet: Secondary | ICD-10-CM | POA: Insufficient documentation

## 2021-10-04 DIAGNOSIS — Z89512 Acquired absence of left leg below knee: Secondary | ICD-10-CM | POA: Diagnosis not present

## 2021-10-05 DIAGNOSIS — Z89512 Acquired absence of left leg below knee: Secondary | ICD-10-CM | POA: Diagnosis not present

## 2021-10-06 DIAGNOSIS — Z89512 Acquired absence of left leg below knee: Secondary | ICD-10-CM | POA: Diagnosis not present

## 2021-10-09 DIAGNOSIS — S88112A Complete traumatic amputation at level between knee and ankle, left lower leg, initial encounter: Secondary | ICD-10-CM | POA: Diagnosis not present

## 2021-10-10 ENCOUNTER — Encounter: Payer: Self-pay | Admitting: Rehabilitation

## 2021-10-10 ENCOUNTER — Ambulatory Visit: Payer: Medicaid Other | Admitting: Rehabilitation

## 2021-10-10 DIAGNOSIS — R2681 Unsteadiness on feet: Secondary | ICD-10-CM

## 2021-10-10 DIAGNOSIS — M6281 Muscle weakness (generalized): Secondary | ICD-10-CM | POA: Diagnosis not present

## 2021-10-10 DIAGNOSIS — R293 Abnormal posture: Secondary | ICD-10-CM

## 2021-10-10 DIAGNOSIS — S88112A Complete traumatic amputation at level between knee and ankle, left lower leg, initial encounter: Secondary | ICD-10-CM | POA: Diagnosis not present

## 2021-10-10 DIAGNOSIS — R2689 Other abnormalities of gait and mobility: Secondary | ICD-10-CM | POA: Diagnosis not present

## 2021-10-11 DIAGNOSIS — S88112A Complete traumatic amputation at level between knee and ankle, left lower leg, initial encounter: Secondary | ICD-10-CM | POA: Diagnosis not present

## 2021-10-11 DIAGNOSIS — L97413 Non-pressure chronic ulcer of right heel and midfoot with necrosis of muscle: Secondary | ICD-10-CM | POA: Diagnosis not present

## 2021-10-12 DIAGNOSIS — S88112A Complete traumatic amputation at level between knee and ankle, left lower leg, initial encounter: Secondary | ICD-10-CM | POA: Diagnosis not present

## 2021-10-13 DIAGNOSIS — S88112A Complete traumatic amputation at level between knee and ankle, left lower leg, initial encounter: Secondary | ICD-10-CM | POA: Diagnosis not present

## 2021-10-14 DIAGNOSIS — Z419 Encounter for procedure for purposes other than remedying health state, unspecified: Secondary | ICD-10-CM | POA: Diagnosis not present

## 2021-10-16 DIAGNOSIS — S88112A Complete traumatic amputation at level between knee and ankle, left lower leg, initial encounter: Secondary | ICD-10-CM | POA: Diagnosis not present

## 2021-10-17 DIAGNOSIS — S88112A Complete traumatic amputation at level between knee and ankle, left lower leg, initial encounter: Secondary | ICD-10-CM | POA: Diagnosis not present

## 2021-10-18 DIAGNOSIS — S88112A Complete traumatic amputation at level between knee and ankle, left lower leg, initial encounter: Secondary | ICD-10-CM | POA: Diagnosis not present

## 2021-10-19 DIAGNOSIS — S88112A Complete traumatic amputation at level between knee and ankle, left lower leg, initial encounter: Secondary | ICD-10-CM | POA: Diagnosis not present

## 2021-10-20 ENCOUNTER — Ambulatory Visit: Payer: Medicaid Other | Attending: Physician Assistant | Admitting: Physical Therapy

## 2021-10-20 DIAGNOSIS — R293 Abnormal posture: Secondary | ICD-10-CM | POA: Insufficient documentation

## 2021-10-20 DIAGNOSIS — R2689 Other abnormalities of gait and mobility: Secondary | ICD-10-CM | POA: Insufficient documentation

## 2021-10-20 DIAGNOSIS — M6281 Muscle weakness (generalized): Secondary | ICD-10-CM | POA: Insufficient documentation

## 2021-10-20 DIAGNOSIS — S88112A Complete traumatic amputation at level between knee and ankle, left lower leg, initial encounter: Secondary | ICD-10-CM | POA: Diagnosis not present

## 2021-10-20 DIAGNOSIS — R2681 Unsteadiness on feet: Secondary | ICD-10-CM | POA: Insufficient documentation

## 2021-10-23 DIAGNOSIS — S88112A Complete traumatic amputation at level between knee and ankle, left lower leg, initial encounter: Secondary | ICD-10-CM | POA: Diagnosis not present

## 2021-10-24 ENCOUNTER — Ambulatory Visit: Payer: Medicaid Other | Admitting: Rehabilitation

## 2021-10-24 DIAGNOSIS — S88112A Complete traumatic amputation at level between knee and ankle, left lower leg, initial encounter: Secondary | ICD-10-CM | POA: Diagnosis not present

## 2021-10-25 DIAGNOSIS — S88112A Complete traumatic amputation at level between knee and ankle, left lower leg, initial encounter: Secondary | ICD-10-CM | POA: Diagnosis not present

## 2021-10-27 DIAGNOSIS — S88112A Complete traumatic amputation at level between knee and ankle, left lower leg, initial encounter: Secondary | ICD-10-CM | POA: Diagnosis not present

## 2021-10-30 DIAGNOSIS — S88112A Complete traumatic amputation at level between knee and ankle, left lower leg, initial encounter: Secondary | ICD-10-CM | POA: Diagnosis not present

## 2021-10-31 ENCOUNTER — Ambulatory Visit: Payer: Medicaid Other | Admitting: Rehabilitation

## 2021-10-31 ENCOUNTER — Encounter: Payer: Self-pay | Admitting: Rehabilitation

## 2021-10-31 DIAGNOSIS — R293 Abnormal posture: Secondary | ICD-10-CM

## 2021-10-31 DIAGNOSIS — R2689 Other abnormalities of gait and mobility: Secondary | ICD-10-CM | POA: Diagnosis not present

## 2021-10-31 DIAGNOSIS — R2681 Unsteadiness on feet: Secondary | ICD-10-CM | POA: Diagnosis not present

## 2021-10-31 DIAGNOSIS — M6281 Muscle weakness (generalized): Secondary | ICD-10-CM

## 2021-10-31 DIAGNOSIS — S88112A Complete traumatic amputation at level between knee and ankle, left lower leg, initial encounter: Secondary | ICD-10-CM | POA: Diagnosis not present
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
## 2021-11-01 DIAGNOSIS — S88112A Complete traumatic amputation at level between knee and ankle, left lower leg, initial encounter: Secondary | ICD-10-CM | POA: Diagnosis not present

## 2021-11-02 DIAGNOSIS — S88112A Complete traumatic amputation at level between knee and ankle, left lower leg, initial encounter: Secondary | ICD-10-CM | POA: Diagnosis not present

## 2021-11-03 DIAGNOSIS — S88112A Complete traumatic amputation at level between knee and ankle, left lower leg, initial encounter: Secondary | ICD-10-CM | POA: Diagnosis not present

## 2021-11-06 DIAGNOSIS — S88112A Complete traumatic amputation at level between knee and ankle, left lower leg, initial encounter: Secondary | ICD-10-CM | POA: Diagnosis not present

## 2021-11-07 ENCOUNTER — Ambulatory Visit: Payer: Medicaid Other | Admitting: Rehabilitation

## 2021-11-07 ENCOUNTER — Encounter: Payer: Medicaid Other | Admitting: Rehabilitation

## 2021-11-07 DIAGNOSIS — S88112A Complete traumatic amputation at level between knee and ankle, left lower leg, initial encounter: Secondary | ICD-10-CM | POA: Diagnosis not present

## 2021-11-08 DIAGNOSIS — S88112A Complete traumatic amputation at level between knee and ankle, left lower leg, initial encounter: Secondary | ICD-10-CM | POA: Diagnosis not present

## 2021-11-10 DIAGNOSIS — L97413 Non-pressure chronic ulcer of right heel and midfoot with necrosis of muscle: Secondary | ICD-10-CM | POA: Diagnosis not present

## 2021-11-10 DIAGNOSIS — S88112A Complete traumatic amputation at level between knee and ankle, left lower leg, initial encounter: Secondary | ICD-10-CM | POA: Diagnosis not present

## 2021-11-13 DIAGNOSIS — S88112A Complete traumatic amputation at level between knee and ankle, left lower leg, initial encounter: Secondary | ICD-10-CM | POA: Diagnosis not present

## 2021-11-14 ENCOUNTER — Encounter: Payer: Self-pay | Admitting: Rehabilitation

## 2021-11-14 ENCOUNTER — Encounter: Payer: Medicaid Other | Admitting: Rehabilitation

## 2021-11-14 ENCOUNTER — Ambulatory Visit: Payer: Medicaid Other | Attending: Physician Assistant | Admitting: Rehabilitation

## 2021-11-14 ENCOUNTER — Telehealth: Payer: Self-pay | Admitting: Nurse Practitioner

## 2021-11-14 DIAGNOSIS — M6281 Muscle weakness (generalized): Secondary | ICD-10-CM | POA: Insufficient documentation

## 2021-11-14 DIAGNOSIS — R2681 Unsteadiness on feet: Secondary | ICD-10-CM | POA: Diagnosis not present

## 2021-11-14 DIAGNOSIS — R2689 Other abnormalities of gait and mobility: Secondary | ICD-10-CM | POA: Diagnosis not present

## 2021-11-14 DIAGNOSIS — R293 Abnormal posture: Secondary | ICD-10-CM | POA: Diagnosis not present

## 2021-11-14 DIAGNOSIS — S88112A Complete traumatic amputation at level between knee and ankle, left lower leg, initial encounter: Secondary | ICD-10-CM | POA: Diagnosis not present

## 2021-11-14 DIAGNOSIS — Z419 Encounter for procedure for purposes other than remedying health state, unspecified: Secondary | ICD-10-CM | POA: Diagnosis not present

## 2021-11-14 NOTE — Telephone Encounter (Signed)
..   Medicaid Managed Care   Unsuccessful Outreach Note  11/14/2021 Name: Ronnie Shaw MRN: [id number] DOB: [birth year]  Referred by: Claiborne Rigg, NP Reason for referral : High Risk Managed Medicaid (I called the patient today to get him scheduled with the MM team. I left my name and number on his VM.)   An unsuccessful telephone outreach was attempted today. The patient was referred to the case management team for assistance with care management and care coordination.   Follow Up Plan: The care management team will reach out to the patient again over the next 14 days.      Weston Settle Care Guide, High Risk Medicaid Managed Care Embedded Care Coordination [facility]  [facility]    SIGNATURE
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
## 2021-11-15 DIAGNOSIS — S88112A Complete traumatic amputation at level between knee and ankle, left lower leg, initial encounter: Secondary | ICD-10-CM | POA: Diagnosis not present

## 2021-11-16 DIAGNOSIS — S88112A Complete traumatic amputation at level between knee and ankle, left lower leg, initial encounter: Secondary | ICD-10-CM | POA: Diagnosis not present

## 2021-11-20 DIAGNOSIS — S88112A Complete traumatic amputation at level between knee and ankle, left lower leg, initial encounter: Secondary | ICD-10-CM | POA: Diagnosis not present

## 2021-11-21 DIAGNOSIS — S88112A Complete traumatic amputation at level between knee and ankle, left lower leg, initial encounter: Secondary | ICD-10-CM | POA: Diagnosis not present

## 2021-11-22 DIAGNOSIS — S88112A Complete traumatic amputation at level between knee and ankle, left lower leg, initial encounter: Secondary | ICD-10-CM | POA: Diagnosis not present

## 2021-11-23 DIAGNOSIS — S88112A Complete traumatic amputation at level between knee and ankle, left lower leg, initial encounter: Secondary | ICD-10-CM | POA: Diagnosis not present

## 2021-11-24 DIAGNOSIS — S88112A Complete traumatic amputation at level between knee and ankle, left lower leg, initial encounter: Secondary | ICD-10-CM | POA: Diagnosis not present

## 2021-11-27 DIAGNOSIS — S88112A Complete traumatic amputation at level between knee and ankle, left lower leg, initial encounter: Secondary | ICD-10-CM | POA: Diagnosis not present

## 2021-11-28 DIAGNOSIS — S88112A Complete traumatic amputation at level between knee and ankle, left lower leg, initial encounter: Secondary | ICD-10-CM | POA: Diagnosis not present

## 2021-11-29 DIAGNOSIS — S88112A Complete traumatic amputation at level between knee and ankle, left lower leg, initial encounter: Secondary | ICD-10-CM | POA: Diagnosis not present

## 2021-11-30 DIAGNOSIS — S88112A Complete traumatic amputation at level between knee and ankle, left lower leg, initial encounter: Secondary | ICD-10-CM | POA: Diagnosis not present

## 2021-12-01 DIAGNOSIS — S88112A Complete traumatic amputation at level between knee and ankle, left lower leg, initial encounter: Secondary | ICD-10-CM | POA: Diagnosis not present

## 2021-12-04 DIAGNOSIS — S88112A Complete traumatic amputation at level between knee and ankle, left lower leg, initial encounter: Secondary | ICD-10-CM | POA: Diagnosis not present

## 2021-12-05 DIAGNOSIS — S88112A Complete traumatic amputation at level between knee and ankle, left lower leg, initial encounter: Secondary | ICD-10-CM | POA: Diagnosis not present

## 2021-12-06 DIAGNOSIS — S88112A Complete traumatic amputation at level between knee and ankle, left lower leg, initial encounter: Secondary | ICD-10-CM | POA: Diagnosis not present

## 2021-12-07 DIAGNOSIS — S88112A Complete traumatic amputation at level between knee and ankle, left lower leg, initial encounter: Secondary | ICD-10-CM | POA: Diagnosis not present

## 2021-12-08 DIAGNOSIS — S88112A Complete traumatic amputation at level between knee and ankle, left lower leg, initial encounter: Secondary | ICD-10-CM | POA: Diagnosis not present

## 2021-12-11 DIAGNOSIS — S88112A Complete traumatic amputation at level between knee and ankle, left lower leg, initial encounter: Secondary | ICD-10-CM | POA: Diagnosis not present

## 2021-12-12 DIAGNOSIS — S88112A Complete traumatic amputation at level between knee and ankle, left lower leg, initial encounter: Secondary | ICD-10-CM | POA: Diagnosis not present

## 2021-12-13 DIAGNOSIS — S88112A Complete traumatic amputation at level between knee and ankle, left lower leg, initial encounter: Secondary | ICD-10-CM | POA: Diagnosis not present

## 2021-12-14 DIAGNOSIS — S88112A Complete traumatic amputation at level between knee and ankle, left lower leg, initial encounter: Secondary | ICD-10-CM | POA: Diagnosis not present

## 2021-12-15 DIAGNOSIS — Z419 Encounter for procedure for purposes other than remedying health state, unspecified: Secondary | ICD-10-CM | POA: Diagnosis not present

## 2021-12-15 DIAGNOSIS — S88112A Complete traumatic amputation at level between knee and ankle, left lower leg, initial encounter: Secondary | ICD-10-CM | POA: Diagnosis not present

## 2021-12-18 DIAGNOSIS — S88112A Complete traumatic amputation at level between knee and ankle, left lower leg, initial encounter: Secondary | ICD-10-CM | POA: Diagnosis not present

## 2021-12-19 DIAGNOSIS — E119 Type 2 diabetes mellitus without complications: Secondary | ICD-10-CM | POA: Diagnosis not present

## 2021-12-19 DIAGNOSIS — E7800 Pure hypercholesterolemia, unspecified: Secondary | ICD-10-CM | POA: Diagnosis not present

## 2021-12-19 DIAGNOSIS — E11621 Type 2 diabetes mellitus with foot ulcer: Secondary | ICD-10-CM | POA: Diagnosis not present

## 2021-12-19 DIAGNOSIS — D539 Nutritional anemia, unspecified: Secondary | ICD-10-CM | POA: Diagnosis not present

## 2021-12-19 DIAGNOSIS — L97413 Non-pressure chronic ulcer of right heel and midfoot with necrosis of muscle: Secondary | ICD-10-CM | POA: Diagnosis not present

## 2021-12-19 DIAGNOSIS — E559 Vitamin D deficiency, unspecified: Secondary | ICD-10-CM | POA: Diagnosis not present

## 2021-12-19 DIAGNOSIS — R5383 Other fatigue: Secondary | ICD-10-CM | POA: Diagnosis not present

## 2021-12-19 DIAGNOSIS — I10 Essential (primary) hypertension: Secondary | ICD-10-CM | POA: Diagnosis not present

## 2021-12-19 DIAGNOSIS — S88112A Complete traumatic amputation at level between knee and ankle, left lower leg, initial encounter: Secondary | ICD-10-CM | POA: Diagnosis not present

## 2021-12-19 DIAGNOSIS — Z79899 Other long term (current) drug therapy: Secondary | ICD-10-CM | POA: Diagnosis not present

## 2021-12-19 DIAGNOSIS — E1165 Type 2 diabetes mellitus with hyperglycemia: Secondary | ICD-10-CM | POA: Diagnosis not present

## 2021-12-19 DIAGNOSIS — Z6841 Body Mass Index (BMI) 40.0 and over, adult: Secondary | ICD-10-CM | POA: Diagnosis not present

## 2021-12-20 DIAGNOSIS — S88112A Complete traumatic amputation at level between knee and ankle, left lower leg, initial encounter: Secondary | ICD-10-CM | POA: Diagnosis not present

## 2021-12-21 DIAGNOSIS — S88112A Complete traumatic amputation at level between knee and ankle, left lower leg, initial encounter: Secondary | ICD-10-CM | POA: Diagnosis not present

## 2021-12-22 DIAGNOSIS — S88112A Complete traumatic amputation at level between knee and ankle, left lower leg, initial encounter: Secondary | ICD-10-CM | POA: Diagnosis not present

## 2021-12-25 DIAGNOSIS — S88112A Complete traumatic amputation at level between knee and ankle, left lower leg, initial encounter: Secondary | ICD-10-CM | POA: Diagnosis not present

## 2021-12-26 DIAGNOSIS — S88112A Complete traumatic amputation at level between knee and ankle, left lower leg, initial encounter: Secondary | ICD-10-CM | POA: Diagnosis not present

## 2021-12-27 DIAGNOSIS — S88112A Complete traumatic amputation at level between knee and ankle, left lower leg, initial encounter: Secondary | ICD-10-CM | POA: Diagnosis not present

## 2021-12-28 DIAGNOSIS — S88112A Complete traumatic amputation at level between knee and ankle, left lower leg, initial encounter: Secondary | ICD-10-CM | POA: Diagnosis not present

## 2021-12-29 DIAGNOSIS — S88112A Complete traumatic amputation at level between knee and ankle, left lower leg, initial encounter: Secondary | ICD-10-CM | POA: Diagnosis not present

## 2022-01-01 DIAGNOSIS — S88112A Complete traumatic amputation at level between knee and ankle, left lower leg, initial encounter: Secondary | ICD-10-CM | POA: Diagnosis not present

## 2022-01-02 DIAGNOSIS — S88112A Complete traumatic amputation at level between knee and ankle, left lower leg, initial encounter: Secondary | ICD-10-CM | POA: Diagnosis not present

## 2022-01-03 DIAGNOSIS — S88112A Complete traumatic amputation at level between knee and ankle, left lower leg, initial encounter: Secondary | ICD-10-CM | POA: Diagnosis not present

## 2022-01-04 DIAGNOSIS — S88112A Complete traumatic amputation at level between knee and ankle, left lower leg, initial encounter: Secondary | ICD-10-CM | POA: Diagnosis not present

## 2022-01-05 DIAGNOSIS — S88112A Complete traumatic amputation at level between knee and ankle, left lower leg, initial encounter: Secondary | ICD-10-CM | POA: Diagnosis not present

## 2022-01-08 DIAGNOSIS — S88112A Complete traumatic amputation at level between knee and ankle, left lower leg, initial encounter: Secondary | ICD-10-CM | POA: Diagnosis not present

## 2022-01-08 DIAGNOSIS — I10 Essential (primary) hypertension: Secondary | ICD-10-CM | POA: Diagnosis not present

## 2022-01-08 DIAGNOSIS — E119 Type 2 diabetes mellitus without complications: Secondary | ICD-10-CM | POA: Diagnosis not present

## 2022-01-08 DIAGNOSIS — Z6841 Body Mass Index (BMI) 40.0 and over, adult: Secondary | ICD-10-CM | POA: Diagnosis not present

## 2022-01-09 DIAGNOSIS — S88112A Complete traumatic amputation at level between knee and ankle, left lower leg, initial encounter: Secondary | ICD-10-CM | POA: Diagnosis not present

## 2022-01-10 DIAGNOSIS — S88112A Complete traumatic amputation at level between knee and ankle, left lower leg, initial encounter: Secondary | ICD-10-CM | POA: Diagnosis not present

## 2022-01-11 DIAGNOSIS — S88112A Complete traumatic amputation at level between knee and ankle, left lower leg, initial encounter: Secondary | ICD-10-CM | POA: Diagnosis not present

## 2022-01-12 DIAGNOSIS — S88112A Complete traumatic amputation at level between knee and ankle, left lower leg, initial encounter: Secondary | ICD-10-CM | POA: Diagnosis not present

## 2022-01-14 DIAGNOSIS — Z419 Encounter for procedure for purposes other than remedying health state, unspecified: Secondary | ICD-10-CM | POA: Diagnosis not present

## 2022-01-15 DIAGNOSIS — S88112A Complete traumatic amputation at level between knee and ankle, left lower leg, initial encounter: Secondary | ICD-10-CM | POA: Diagnosis not present

## 2022-01-16 DIAGNOSIS — S88112A Complete traumatic amputation at level between knee and ankle, left lower leg, initial encounter: Secondary | ICD-10-CM | POA: Diagnosis not present

## 2022-01-17 DIAGNOSIS — S88112A Complete traumatic amputation at level between knee and ankle, left lower leg, initial encounter: Secondary | ICD-10-CM | POA: Diagnosis not present

## 2022-01-18 DIAGNOSIS — S88112A Complete traumatic amputation at level between knee and ankle, left lower leg, initial encounter: Secondary | ICD-10-CM | POA: Diagnosis not present

## 2022-01-19 DIAGNOSIS — S88112A Complete traumatic amputation at level between knee and ankle, left lower leg, initial encounter: Secondary | ICD-10-CM | POA: Diagnosis not present

## 2022-01-22 DIAGNOSIS — S88112A Complete traumatic amputation at level between knee and ankle, left lower leg, initial encounter: Secondary | ICD-10-CM | POA: Diagnosis not present

## 2022-01-23 DIAGNOSIS — S88112A Complete traumatic amputation at level between knee and ankle, left lower leg, initial encounter: Secondary | ICD-10-CM | POA: Diagnosis not present

## 2022-01-24 DIAGNOSIS — S88112A Complete traumatic amputation at level between knee and ankle, left lower leg, initial encounter: Secondary | ICD-10-CM | POA: Diagnosis not present

## 2022-01-25 DIAGNOSIS — S88112A Complete traumatic amputation at level between knee and ankle, left lower leg, initial encounter: Secondary | ICD-10-CM | POA: Diagnosis not present

## 2022-01-29 DIAGNOSIS — S88112A Complete traumatic amputation at level between knee and ankle, left lower leg, initial encounter: Secondary | ICD-10-CM | POA: Diagnosis not present

## 2022-01-30 DIAGNOSIS — S88112A Complete traumatic amputation at level between knee and ankle, left lower leg, initial encounter: Secondary | ICD-10-CM | POA: Diagnosis not present

## 2022-01-31 DIAGNOSIS — S88112A Complete traumatic amputation at level between knee and ankle, left lower leg, initial encounter: Secondary | ICD-10-CM | POA: Diagnosis not present

## 2022-02-01 DIAGNOSIS — S88112A Complete traumatic amputation at level between knee and ankle, left lower leg, initial encounter: Secondary | ICD-10-CM | POA: Diagnosis not present

## 2022-02-02 DIAGNOSIS — S88112A Complete traumatic amputation at level between knee and ankle, left lower leg, initial encounter: Secondary | ICD-10-CM | POA: Diagnosis not present

## 2022-02-05 DIAGNOSIS — S88112A Complete traumatic amputation at level between knee and ankle, left lower leg, initial encounter: Secondary | ICD-10-CM | POA: Diagnosis not present

## 2022-02-06 DIAGNOSIS — S88112A Complete traumatic amputation at level between knee and ankle, left lower leg, initial encounter: Secondary | ICD-10-CM | POA: Diagnosis not present

## 2022-02-07 DIAGNOSIS — S88112A Complete traumatic amputation at level between knee and ankle, left lower leg, initial encounter: Secondary | ICD-10-CM | POA: Diagnosis not present

## 2022-02-08 DIAGNOSIS — S88112A Complete traumatic amputation at level between knee and ankle, left lower leg, initial encounter: Secondary | ICD-10-CM | POA: Diagnosis not present

## 2022-02-09 DIAGNOSIS — S88112A Complete traumatic amputation at level between knee and ankle, left lower leg, initial encounter: Secondary | ICD-10-CM | POA: Diagnosis not present

## 2022-02-12 DIAGNOSIS — S88112A Complete traumatic amputation at level between knee and ankle, left lower leg, initial encounter: Secondary | ICD-10-CM | POA: Diagnosis not present

## 2022-02-13 DIAGNOSIS — S88112A Complete traumatic amputation at level between knee and ankle, left lower leg, initial encounter: Secondary | ICD-10-CM | POA: Diagnosis not present

## 2022-02-14 DIAGNOSIS — S88112A Complete traumatic amputation at level between knee and ankle, left lower leg, initial encounter: Secondary | ICD-10-CM | POA: Diagnosis not present

## 2022-02-14 DIAGNOSIS — Z419 Encounter for procedure for purposes other than remedying health state, unspecified: Secondary | ICD-10-CM | POA: Diagnosis not present

## 2022-02-15 DIAGNOSIS — S88112A Complete traumatic amputation at level between knee and ankle, left lower leg, initial encounter: Secondary | ICD-10-CM | POA: Diagnosis not present

## 2022-02-16 DIAGNOSIS — S88112A Complete traumatic amputation at level between knee and ankle, left lower leg, initial encounter: Secondary | ICD-10-CM | POA: Diagnosis not present

## 2022-02-17 DIAGNOSIS — M79605 Pain in left leg: Secondary | ICD-10-CM | POA: Diagnosis not present

## 2022-02-17 DIAGNOSIS — Z6841 Body Mass Index (BMI) 40.0 and over, adult: Secondary | ICD-10-CM | POA: Diagnosis not present

## 2022-02-17 DIAGNOSIS — I10 Essential (primary) hypertension: Secondary | ICD-10-CM | POA: Diagnosis not present

## 2022-02-17 DIAGNOSIS — L03116 Cellulitis of left lower limb: Secondary | ICD-10-CM | POA: Diagnosis not present

## 2022-02-17 DIAGNOSIS — E119 Type 2 diabetes mellitus without complications: Secondary | ICD-10-CM | POA: Diagnosis not present

## 2022-02-17 DIAGNOSIS — S98012S Complete traumatic amputation of left foot at ankle level, sequela: Secondary | ICD-10-CM | POA: Diagnosis not present

## 2022-02-19 DIAGNOSIS — S88112A Complete traumatic amputation at level between knee and ankle, left lower leg, initial encounter: Secondary | ICD-10-CM | POA: Diagnosis not present

## 2022-02-20 DIAGNOSIS — S88112A Complete traumatic amputation at level between knee and ankle, left lower leg, initial encounter: Secondary | ICD-10-CM | POA: Diagnosis not present

## 2022-02-21 DIAGNOSIS — S88112A Complete traumatic amputation at level between knee and ankle, left lower leg, initial encounter: Secondary | ICD-10-CM | POA: Diagnosis not present

## 2022-02-22 DIAGNOSIS — E1142 Type 2 diabetes mellitus with diabetic polyneuropathy: Secondary | ICD-10-CM | POA: Diagnosis not present

## 2022-02-22 DIAGNOSIS — L97822 Non-pressure chronic ulcer of other part of left lower leg with fat layer exposed: Secondary | ICD-10-CM | POA: Diagnosis not present

## 2022-02-22 DIAGNOSIS — E1136 Type 2 diabetes mellitus with diabetic cataract: Secondary | ICD-10-CM | POA: Diagnosis not present

## 2022-02-22 DIAGNOSIS — S88112A Complete traumatic amputation at level between knee and ankle, left lower leg, initial encounter: Secondary | ICD-10-CM | POA: Diagnosis not present

## 2022-02-22 DIAGNOSIS — E1151 Type 2 diabetes mellitus with diabetic peripheral angiopathy without gangrene: Secondary | ICD-10-CM | POA: Diagnosis not present

## 2022-02-23 DIAGNOSIS — S88112A Complete traumatic amputation at level between knee and ankle, left lower leg, initial encounter: Secondary | ICD-10-CM | POA: Diagnosis not present

## 2022-02-26 DIAGNOSIS — S88112A Complete traumatic amputation at level between knee and ankle, left lower leg, initial encounter: Secondary | ICD-10-CM | POA: Diagnosis not present

## 2022-02-27 DIAGNOSIS — S88112A Complete traumatic amputation at level between knee and ankle, left lower leg, initial encounter: Secondary | ICD-10-CM | POA: Diagnosis not present

## 2022-02-28 DIAGNOSIS — Z993 Dependence on wheelchair: Secondary | ICD-10-CM | POA: Diagnosis not present

## 2022-02-28 DIAGNOSIS — E1142 Type 2 diabetes mellitus with diabetic polyneuropathy: Secondary | ICD-10-CM | POA: Diagnosis not present

## 2022-02-28 DIAGNOSIS — L97922 Non-pressure chronic ulcer of unspecified part of left lower leg with fat layer exposed: Secondary | ICD-10-CM | POA: Diagnosis not present

## 2022-02-28 DIAGNOSIS — Z792 Long term (current) use of antibiotics: Secondary | ICD-10-CM | POA: Diagnosis not present

## 2022-02-28 DIAGNOSIS — E11622 Type 2 diabetes mellitus with other skin ulcer: Secondary | ICD-10-CM | POA: Diagnosis not present

## 2022-02-28 DIAGNOSIS — L97929 Non-pressure chronic ulcer of unspecified part of left lower leg with unspecified severity: Secondary | ICD-10-CM | POA: Diagnosis not present

## 2022-02-28 DIAGNOSIS — Z89522 Acquired absence of left knee: Secondary | ICD-10-CM | POA: Diagnosis not present

## 2022-02-28 DIAGNOSIS — F1290 Cannabis use, unspecified, uncomplicated: Secondary | ICD-10-CM | POA: Diagnosis not present

## 2022-02-28 DIAGNOSIS — L97822 Non-pressure chronic ulcer of other part of left lower leg with fat layer exposed: Secondary | ICD-10-CM | POA: Diagnosis not present

## 2022-02-28 DIAGNOSIS — Z89512 Acquired absence of left leg below knee: Secondary | ICD-10-CM | POA: Diagnosis not present

## 2022-02-28 DIAGNOSIS — S88112A Complete traumatic amputation at level between knee and ankle, left lower leg, initial encounter: Secondary | ICD-10-CM | POA: Diagnosis not present

## 2022-03-01 DIAGNOSIS — S88112A Complete traumatic amputation at level between knee and ankle, left lower leg, initial encounter: Secondary | ICD-10-CM | POA: Diagnosis not present

## 2022-03-02 DIAGNOSIS — S88112A Complete traumatic amputation at level between knee and ankle, left lower leg, initial encounter: Secondary | ICD-10-CM | POA: Diagnosis not present

## 2022-03-05 DIAGNOSIS — S88112A Complete traumatic amputation at level between knee and ankle, left lower leg, initial encounter: Secondary | ICD-10-CM | POA: Diagnosis not present

## 2022-03-06 DIAGNOSIS — S88112A Complete traumatic amputation at level between knee and ankle, left lower leg, initial encounter: Secondary | ICD-10-CM | POA: Diagnosis not present

## 2022-03-07 DIAGNOSIS — S88112A Complete traumatic amputation at level between knee and ankle, left lower leg, initial encounter: Secondary | ICD-10-CM | POA: Diagnosis not present

## 2022-03-08 DIAGNOSIS — S88112A Complete traumatic amputation at level between knee and ankle, left lower leg, initial encounter: Secondary | ICD-10-CM | POA: Diagnosis not present

## 2022-03-09 DIAGNOSIS — S88112A Complete traumatic amputation at level between knee and ankle, left lower leg, initial encounter: Secondary | ICD-10-CM | POA: Diagnosis not present

## 2022-03-12 DIAGNOSIS — S88112A Complete traumatic amputation at level between knee and ankle, left lower leg, initial encounter: Secondary | ICD-10-CM | POA: Diagnosis not present

## 2022-03-13 DIAGNOSIS — S88112A Complete traumatic amputation at level between knee and ankle, left lower leg, initial encounter: Secondary | ICD-10-CM | POA: Diagnosis not present

## 2022-03-14 DIAGNOSIS — S88112A Complete traumatic amputation at level between knee and ankle, left lower leg, initial encounter: Secondary | ICD-10-CM | POA: Diagnosis not present

## 2022-03-15 DIAGNOSIS — S88112A Complete traumatic amputation at level between knee and ankle, left lower leg, initial encounter: Secondary | ICD-10-CM | POA: Diagnosis not present

## 2022-03-16 DIAGNOSIS — Z419 Encounter for procedure for purposes other than remedying health state, unspecified: Secondary | ICD-10-CM | POA: Diagnosis not present

## 2022-03-16 DIAGNOSIS — S88112A Complete traumatic amputation at level between knee and ankle, left lower leg, initial encounter: Secondary | ICD-10-CM | POA: Diagnosis not present

## 2022-03-19 DIAGNOSIS — S88112A Complete traumatic amputation at level between knee and ankle, left lower leg, initial encounter: Secondary | ICD-10-CM | POA: Diagnosis not present

## 2022-03-20 DIAGNOSIS — S88112A Complete traumatic amputation at level between knee and ankle, left lower leg, initial encounter: Secondary | ICD-10-CM | POA: Diagnosis not present

## 2022-03-21 DIAGNOSIS — S88112A Complete traumatic amputation at level between knee and ankle, left lower leg, initial encounter: Secondary | ICD-10-CM | POA: Diagnosis not present

## 2022-03-22 DIAGNOSIS — S88112A Complete traumatic amputation at level between knee and ankle, left lower leg, initial encounter: Secondary | ICD-10-CM | POA: Diagnosis not present

## 2022-03-23 DIAGNOSIS — S88112A Complete traumatic amputation at level between knee and ankle, left lower leg, initial encounter: Secondary | ICD-10-CM | POA: Diagnosis not present

## 2022-03-26 DIAGNOSIS — S88112A Complete traumatic amputation at level between knee and ankle, left lower leg, initial encounter: Secondary | ICD-10-CM | POA: Diagnosis not present

## 2022-03-27 DIAGNOSIS — S88112A Complete traumatic amputation at level between knee and ankle, left lower leg, initial encounter: Secondary | ICD-10-CM | POA: Diagnosis not present

## 2022-03-28 DIAGNOSIS — S88112A Complete traumatic amputation at level between knee and ankle, left lower leg, initial encounter: Secondary | ICD-10-CM | POA: Diagnosis not present

## 2022-03-28 DIAGNOSIS — L97822 Non-pressure chronic ulcer of other part of left lower leg with fat layer exposed: Secondary | ICD-10-CM | POA: Diagnosis not present

## 2022-03-29 DIAGNOSIS — S88112A Complete traumatic amputation at level between knee and ankle, left lower leg, initial encounter: Secondary | ICD-10-CM | POA: Diagnosis not present

## 2022-03-30 DIAGNOSIS — S88112A Complete traumatic amputation at level between knee and ankle, left lower leg, initial encounter: Secondary | ICD-10-CM | POA: Diagnosis not present

## 2022-04-02 DIAGNOSIS — S88112A Complete traumatic amputation at level between knee and ankle, left lower leg, initial encounter: Secondary | ICD-10-CM | POA: Diagnosis not present

## 2022-04-03 DIAGNOSIS — S88112A Complete traumatic amputation at level between knee and ankle, left lower leg, initial encounter: Secondary | ICD-10-CM | POA: Diagnosis not present

## 2022-04-04 DIAGNOSIS — S88112A Complete traumatic amputation at level between knee and ankle, left lower leg, initial encounter: Secondary | ICD-10-CM | POA: Diagnosis not present

## 2022-04-05 DIAGNOSIS — S88112A Complete traumatic amputation at level between knee and ankle, left lower leg, initial encounter: Secondary | ICD-10-CM | POA: Diagnosis not present

## 2022-04-06 DIAGNOSIS — S88112A Complete traumatic amputation at level between knee and ankle, left lower leg, initial encounter: Secondary | ICD-10-CM | POA: Diagnosis not present

## 2022-04-09 DIAGNOSIS — S88112A Complete traumatic amputation at level between knee and ankle, left lower leg, initial encounter: Secondary | ICD-10-CM | POA: Diagnosis not present

## 2022-04-10 DIAGNOSIS — S88112A Complete traumatic amputation at level between knee and ankle, left lower leg, initial encounter: Secondary | ICD-10-CM | POA: Diagnosis not present

## 2022-04-11 DIAGNOSIS — S88112A Complete traumatic amputation at level between knee and ankle, left lower leg, initial encounter: Secondary | ICD-10-CM | POA: Diagnosis not present

## 2022-04-12 DIAGNOSIS — S88112A Complete traumatic amputation at level between knee and ankle, left lower leg, initial encounter: Secondary | ICD-10-CM | POA: Diagnosis not present

## 2022-04-13 DIAGNOSIS — S88112A Complete traumatic amputation at level between knee and ankle, left lower leg, initial encounter: Secondary | ICD-10-CM | POA: Diagnosis not present

## 2022-04-17 DIAGNOSIS — S88112A Complete traumatic amputation at level between knee and ankle, left lower leg, initial encounter: Secondary | ICD-10-CM | POA: Diagnosis not present

## 2022-04-18 DIAGNOSIS — S88112A Complete traumatic amputation at level between knee and ankle, left lower leg, initial encounter: Secondary | ICD-10-CM | POA: Diagnosis not present

## 2022-04-19 DIAGNOSIS — S88112A Complete traumatic amputation at level between knee and ankle, left lower leg, initial encounter: Secondary | ICD-10-CM | POA: Diagnosis not present

## 2022-04-20 DIAGNOSIS — S88112A Complete traumatic amputation at level between knee and ankle, left lower leg, initial encounter: Secondary | ICD-10-CM | POA: Diagnosis not present

## 2022-04-23 DIAGNOSIS — S88112A Complete traumatic amputation at level between knee and ankle, left lower leg, initial encounter: Secondary | ICD-10-CM | POA: Diagnosis not present

## 2022-04-24 DIAGNOSIS — S88112A Complete traumatic amputation at level between knee and ankle, left lower leg, initial encounter: Secondary | ICD-10-CM | POA: Diagnosis not present

## 2022-04-25 DIAGNOSIS — S88112A Complete traumatic amputation at level between knee and ankle, left lower leg, initial encounter: Secondary | ICD-10-CM | POA: Diagnosis not present

## 2022-04-26 DIAGNOSIS — S88112A Complete traumatic amputation at level between knee and ankle, left lower leg, initial encounter: Secondary | ICD-10-CM | POA: Diagnosis not present

## 2022-04-27 DIAGNOSIS — S88112A Complete traumatic amputation at level between knee and ankle, left lower leg, initial encounter: Secondary | ICD-10-CM | POA: Diagnosis not present

## 2022-04-30 DIAGNOSIS — S88112A Complete traumatic amputation at level between knee and ankle, left lower leg, initial encounter: Secondary | ICD-10-CM | POA: Diagnosis not present

## 2022-05-01 DIAGNOSIS — S88112A Complete traumatic amputation at level between knee and ankle, left lower leg, initial encounter: Secondary | ICD-10-CM | POA: Diagnosis not present

## 2022-05-02 DIAGNOSIS — S88112A Complete traumatic amputation at level between knee and ankle, left lower leg, initial encounter: Secondary | ICD-10-CM | POA: Diagnosis not present

## 2022-05-03 DIAGNOSIS — S88112A Complete traumatic amputation at level between knee and ankle, left lower leg, initial encounter: Secondary | ICD-10-CM | POA: Diagnosis not present

## 2022-05-04 DIAGNOSIS — S88112A Complete traumatic amputation at level between knee and ankle, left lower leg, initial encounter: Secondary | ICD-10-CM | POA: Diagnosis not present

## 2022-05-07 DIAGNOSIS — S88112A Complete traumatic amputation at level between knee and ankle, left lower leg, initial encounter: Secondary | ICD-10-CM | POA: Diagnosis not present

## 2022-05-08 DIAGNOSIS — S88112A Complete traumatic amputation at level between knee and ankle, left lower leg, initial encounter: Secondary | ICD-10-CM | POA: Diagnosis not present

## 2022-05-09 DIAGNOSIS — E1142 Type 2 diabetes mellitus with diabetic polyneuropathy: Secondary | ICD-10-CM | POA: Diagnosis not present

## 2022-05-09 DIAGNOSIS — L97413 Non-pressure chronic ulcer of right heel and midfoot with necrosis of muscle: Secondary | ICD-10-CM | POA: Diagnosis not present

## 2022-05-09 DIAGNOSIS — Z89431 Acquired absence of right foot: Secondary | ICD-10-CM | POA: Diagnosis not present

## 2022-05-09 DIAGNOSIS — Z89512 Acquired absence of left leg below knee: Secondary | ICD-10-CM | POA: Diagnosis not present

## 2022-05-09 DIAGNOSIS — T8789 Other complications of amputation stump: Secondary | ICD-10-CM | POA: Diagnosis not present

## 2022-05-09 DIAGNOSIS — E11621 Type 2 diabetes mellitus with foot ulcer: Secondary | ICD-10-CM | POA: Diagnosis not present

## 2022-05-09 DIAGNOSIS — L97123 Non-pressure chronic ulcer of left thigh with necrosis of muscle: Secondary | ICD-10-CM | POA: Diagnosis not present

## 2022-05-09 DIAGNOSIS — E08621 Diabetes mellitus due to underlying condition with foot ulcer: Secondary | ICD-10-CM | POA: Diagnosis not present

## 2022-05-09 DIAGNOSIS — E1136 Type 2 diabetes mellitus with diabetic cataract: Secondary | ICD-10-CM | POA: Diagnosis not present

## 2022-05-09 DIAGNOSIS — E46 Unspecified protein-calorie malnutrition: Secondary | ICD-10-CM | POA: Diagnosis not present

## 2022-05-09 DIAGNOSIS — S88112A Complete traumatic amputation at level between knee and ankle, left lower leg, initial encounter: Secondary | ICD-10-CM | POA: Diagnosis not present

## 2022-05-10 DIAGNOSIS — S88112A Complete traumatic amputation at level between knee and ankle, left lower leg, initial encounter: Secondary | ICD-10-CM | POA: Diagnosis not present

## 2022-05-11 DIAGNOSIS — S88112A Complete traumatic amputation at level between knee and ankle, left lower leg, initial encounter: Secondary | ICD-10-CM | POA: Diagnosis not present

## 2022-05-14 DIAGNOSIS — S88112A Complete traumatic amputation at level between knee and ankle, left lower leg, initial encounter: Secondary | ICD-10-CM | POA: Diagnosis not present

## 2022-05-15 DIAGNOSIS — S88112A Complete traumatic amputation at level between knee and ankle, left lower leg, initial encounter: Secondary | ICD-10-CM | POA: Diagnosis not present

## 2022-05-16 DIAGNOSIS — S88112A Complete traumatic amputation at level between knee and ankle, left lower leg, initial encounter: Secondary | ICD-10-CM | POA: Diagnosis not present

## 2022-05-17 DIAGNOSIS — S88112A Complete traumatic amputation at level between knee and ankle, left lower leg, initial encounter: Secondary | ICD-10-CM | POA: Diagnosis not present

## 2022-05-18 DIAGNOSIS — R030 Elevated blood-pressure reading, without diagnosis of hypertension: Secondary | ICD-10-CM | POA: Diagnosis not present

## 2022-05-18 DIAGNOSIS — E119 Type 2 diabetes mellitus without complications: Secondary | ICD-10-CM | POA: Diagnosis not present

## 2022-05-18 DIAGNOSIS — I10 Essential (primary) hypertension: Secondary | ICD-10-CM | POA: Diagnosis not present

## 2022-05-18 DIAGNOSIS — J309 Allergic rhinitis, unspecified: Secondary | ICD-10-CM | POA: Diagnosis not present

## 2022-05-18 DIAGNOSIS — Z6841 Body Mass Index (BMI) 40.0 and over, adult: Secondary | ICD-10-CM | POA: Diagnosis not present

## 2022-05-18 DIAGNOSIS — S88112A Complete traumatic amputation at level between knee and ankle, left lower leg, initial encounter: Secondary | ICD-10-CM | POA: Diagnosis not present

## 2022-05-21 DIAGNOSIS — S88112A Complete traumatic amputation at level between knee and ankle, left lower leg, initial encounter: Secondary | ICD-10-CM | POA: Diagnosis not present

## 2022-05-22 DIAGNOSIS — S88112A Complete traumatic amputation at level between knee and ankle, left lower leg, initial encounter: Secondary | ICD-10-CM | POA: Diagnosis not present

## 2022-05-23 DIAGNOSIS — S88112A Complete traumatic amputation at level between knee and ankle, left lower leg, initial encounter: Secondary | ICD-10-CM | POA: Diagnosis not present

## 2022-05-24 DIAGNOSIS — S88112A Complete traumatic amputation at level between knee and ankle, left lower leg, initial encounter: Secondary | ICD-10-CM | POA: Diagnosis not present

## 2022-05-25 DIAGNOSIS — S88112A Complete traumatic amputation at level between knee and ankle, left lower leg, initial encounter: Secondary | ICD-10-CM | POA: Diagnosis not present
